# Patient Record
Sex: Male | Born: 1950
Health system: Southern US, Community
[De-identification: ages and names within clinical notes are randomized; demographics above are authoritative.]

## PROBLEM LIST (undated history)

## (undated) DIAGNOSIS — R001 Bradycardia, unspecified: Secondary | ICD-10-CM

## (undated) DIAGNOSIS — J189 Pneumonia, unspecified organism: Secondary | ICD-10-CM

## (undated) DIAGNOSIS — M1990 Unspecified osteoarthritis, unspecified site: Secondary | ICD-10-CM

## (undated) DIAGNOSIS — I219 Acute myocardial infarction, unspecified: Secondary | ICD-10-CM

## (undated) DIAGNOSIS — I471 Supraventricular tachycardia, unspecified: Secondary | ICD-10-CM

## (undated) DIAGNOSIS — E7800 Pure hypercholesterolemia, unspecified: Secondary | ICD-10-CM

## (undated) DIAGNOSIS — K279 Peptic ulcer, site unspecified, unspecified as acute or chronic, without hemorrhage or perforation: Secondary | ICD-10-CM

## (undated) DIAGNOSIS — I209 Angina pectoris, unspecified: Secondary | ICD-10-CM

## (undated) DIAGNOSIS — I2510 Atherosclerotic heart disease of native coronary artery without angina pectoris: Secondary | ICD-10-CM

## (undated) DIAGNOSIS — M545 Low back pain, unspecified: Secondary | ICD-10-CM

## (undated) DIAGNOSIS — I2119 ST elevation (STEMI) myocardial infarction involving other coronary artery of inferior wall: Secondary | ICD-10-CM

## (undated) DIAGNOSIS — I10 Essential (primary) hypertension: Secondary | ICD-10-CM

## (undated) DIAGNOSIS — G459 Transient cerebral ischemic attack, unspecified: Secondary | ICD-10-CM

## (undated) DIAGNOSIS — D126 Benign neoplasm of colon, unspecified: Secondary | ICD-10-CM

## (undated) DIAGNOSIS — I639 Cerebral infarction, unspecified: Secondary | ICD-10-CM

## (undated) DIAGNOSIS — K219 Gastro-esophageal reflux disease without esophagitis: Secondary | ICD-10-CM

## (undated) DIAGNOSIS — G8929 Other chronic pain: Secondary | ICD-10-CM

## (undated) HISTORY — DX: Benign neoplasm of colon, unspecified: D12.6

## (undated) HISTORY — DX: Cerebral infarction, unspecified: I63.9

## (undated) HISTORY — PX: CARDIAC CATHETERIZATION: SHX172

---

## 1989-01-13 HISTORY — PX: COLECTOMY: SHX59

## 1997-09-03 ENCOUNTER — Emergency Department (HOSPITAL_COMMUNITY): Admission: EM | Admit: 1997-09-03 | Discharge: 1997-09-03 | Payer: Self-pay | Admitting: Emergency Medicine

## 1997-09-17 ENCOUNTER — Emergency Department (HOSPITAL_COMMUNITY): Admission: EM | Admit: 1997-09-17 | Discharge: 1997-09-17 | Payer: Self-pay | Admitting: Emergency Medicine

## 1998-12-13 ENCOUNTER — Emergency Department (HOSPITAL_COMMUNITY): Admission: EM | Admit: 1998-12-13 | Discharge: 1998-12-13 | Payer: Self-pay | Admitting: Emergency Medicine

## 1999-11-23 ENCOUNTER — Inpatient Hospital Stay (HOSPITAL_COMMUNITY): Admission: EM | Admit: 1999-11-23 | Discharge: 1999-11-24 | Payer: Self-pay | Admitting: Emergency Medicine

## 1999-11-23 ENCOUNTER — Encounter: Payer: Self-pay | Admitting: Emergency Medicine

## 2003-02-11 ENCOUNTER — Emergency Department (HOSPITAL_COMMUNITY): Admission: EM | Admit: 2003-02-11 | Discharge: 2003-02-11 | Payer: Self-pay | Admitting: Emergency Medicine

## 2003-02-11 ENCOUNTER — Encounter: Payer: Self-pay | Admitting: Emergency Medicine

## 2003-03-13 ENCOUNTER — Ambulatory Visit (HOSPITAL_COMMUNITY): Admission: RE | Admit: 2003-03-13 | Discharge: 2003-03-13 | Payer: Self-pay | Admitting: Cardiology

## 2004-02-05 ENCOUNTER — Emergency Department (HOSPITAL_COMMUNITY): Admission: EM | Admit: 2004-02-05 | Discharge: 2004-02-05 | Payer: Self-pay | Admitting: Emergency Medicine

## 2004-02-12 ENCOUNTER — Ambulatory Visit: Payer: Self-pay | Admitting: Internal Medicine

## 2005-07-04 ENCOUNTER — Ambulatory Visit: Payer: Self-pay | Admitting: Family Medicine

## 2005-07-04 ENCOUNTER — Inpatient Hospital Stay (HOSPITAL_COMMUNITY): Admission: EM | Admit: 2005-07-04 | Discharge: 2005-07-05 | Payer: Self-pay | Admitting: *Deleted

## 2005-07-19 ENCOUNTER — Ambulatory Visit: Payer: Self-pay | Admitting: Family Medicine

## 2006-06-05 ENCOUNTER — Emergency Department (HOSPITAL_COMMUNITY): Admission: EM | Admit: 2006-06-05 | Discharge: 2006-06-05 | Payer: Self-pay | Admitting: Emergency Medicine

## 2006-07-09 ENCOUNTER — Emergency Department (HOSPITAL_COMMUNITY): Admission: EM | Admit: 2006-07-09 | Discharge: 2006-07-09 | Payer: Self-pay | Admitting: Emergency Medicine

## 2006-07-12 DIAGNOSIS — I2510 Atherosclerotic heart disease of native coronary artery without angina pectoris: Secondary | ICD-10-CM | POA: Insufficient documentation

## 2006-07-12 DIAGNOSIS — K270 Acute peptic ulcer, site unspecified, with hemorrhage: Secondary | ICD-10-CM | POA: Insufficient documentation

## 2006-08-14 DIAGNOSIS — I2119 ST elevation (STEMI) myocardial infarction involving other coronary artery of inferior wall: Secondary | ICD-10-CM

## 2006-08-14 HISTORY — PX: CORONARY ANGIOPLASTY WITH STENT PLACEMENT: SHX49

## 2006-08-14 HISTORY — DX: ST elevation (STEMI) myocardial infarction involving other coronary artery of inferior wall: I21.19

## 2006-08-15 ENCOUNTER — Emergency Department (HOSPITAL_COMMUNITY): Admission: EM | Admit: 2006-08-15 | Discharge: 2006-08-15 | Payer: Self-pay | Admitting: Emergency Medicine

## 2006-09-03 ENCOUNTER — Inpatient Hospital Stay (HOSPITAL_COMMUNITY): Admission: EM | Admit: 2006-09-03 | Discharge: 2006-09-07 | Payer: Self-pay | Admitting: Emergency Medicine

## 2006-10-14 ENCOUNTER — Emergency Department (HOSPITAL_COMMUNITY): Admission: EM | Admit: 2006-10-14 | Discharge: 2006-10-14 | Payer: Self-pay | Admitting: Emergency Medicine

## 2007-03-20 ENCOUNTER — Emergency Department (HOSPITAL_COMMUNITY): Admission: EM | Admit: 2007-03-20 | Discharge: 2007-03-20 | Payer: Self-pay | Admitting: Emergency Medicine

## 2007-04-26 ENCOUNTER — Encounter (INDEPENDENT_AMBULATORY_CARE_PROVIDER_SITE_OTHER): Payer: Self-pay | Admitting: Family Medicine

## 2007-06-21 ENCOUNTER — Emergency Department (HOSPITAL_COMMUNITY): Admission: EM | Admit: 2007-06-21 | Discharge: 2007-06-21 | Payer: Self-pay | Admitting: Emergency Medicine

## 2008-07-20 ENCOUNTER — Emergency Department (HOSPITAL_COMMUNITY): Admission: EM | Admit: 2008-07-20 | Discharge: 2008-07-20 | Payer: Self-pay | Admitting: Emergency Medicine

## 2008-07-21 ENCOUNTER — Ambulatory Visit: Payer: Self-pay | Admitting: Family Medicine

## 2008-07-21 ENCOUNTER — Telehealth: Payer: Self-pay | Admitting: Family Medicine

## 2008-07-21 DIAGNOSIS — I10 Essential (primary) hypertension: Secondary | ICD-10-CM

## 2008-07-24 ENCOUNTER — Encounter: Payer: Self-pay | Admitting: Family Medicine

## 2008-07-24 ENCOUNTER — Encounter: Admission: RE | Admit: 2008-07-24 | Discharge: 2008-08-19 | Payer: Self-pay | Admitting: Family Medicine

## 2008-11-13 ENCOUNTER — Encounter (HOSPITAL_COMMUNITY): Admission: RE | Admit: 2008-11-13 | Discharge: 2009-01-14 | Payer: Self-pay | Admitting: Cardiology

## 2008-12-21 ENCOUNTER — Observation Stay (HOSPITAL_COMMUNITY): Admission: EM | Admit: 2008-12-21 | Discharge: 2008-12-23 | Payer: Self-pay | Admitting: Emergency Medicine

## 2008-12-21 ENCOUNTER — Ambulatory Visit: Payer: Self-pay | Admitting: Internal Medicine

## 2009-01-12 DIAGNOSIS — F1910 Other psychoactive substance abuse, uncomplicated: Secondary | ICD-10-CM

## 2009-01-12 DIAGNOSIS — M1990 Unspecified osteoarthritis, unspecified site: Secondary | ICD-10-CM | POA: Insufficient documentation

## 2009-01-12 DIAGNOSIS — I10 Essential (primary) hypertension: Secondary | ICD-10-CM | POA: Insufficient documentation

## 2009-01-12 DIAGNOSIS — F17200 Nicotine dependence, unspecified, uncomplicated: Secondary | ICD-10-CM | POA: Insufficient documentation

## 2009-01-12 DIAGNOSIS — Z87891 Personal history of nicotine dependence: Secondary | ICD-10-CM

## 2009-01-12 DIAGNOSIS — I498 Other specified cardiac arrhythmias: Secondary | ICD-10-CM | POA: Insufficient documentation

## 2009-01-12 DIAGNOSIS — K219 Gastro-esophageal reflux disease without esophagitis: Secondary | ICD-10-CM

## 2009-01-12 DIAGNOSIS — E785 Hyperlipidemia, unspecified: Secondary | ICD-10-CM

## 2009-01-12 HISTORY — DX: Nicotine dependence, unspecified, uncomplicated: F17.200

## 2009-01-13 ENCOUNTER — Ambulatory Visit: Payer: Self-pay | Admitting: Internal Medicine

## 2009-02-17 ENCOUNTER — Ambulatory Visit: Payer: Self-pay | Admitting: Family Medicine

## 2009-02-19 ENCOUNTER — Ambulatory Visit: Payer: Self-pay | Admitting: Family Medicine

## 2009-02-19 ENCOUNTER — Encounter: Payer: Self-pay | Admitting: Family Medicine

## 2009-02-19 LAB — CONVERTED CEMR LAB
ALT: 13 units/L (ref 0–53)
AST: 20 units/L (ref 0–37)
Albumin: 4.2 g/dL (ref 3.5–5.2)
Alkaline Phosphatase: 44 units/L (ref 39–117)
BUN: 14 mg/dL (ref 6–23)
Bilirubin, Direct: 0.2 mg/dL (ref 0.0–0.3)
CO2: 25 meq/L (ref 19–32)
Calcium: 9.6 mg/dL (ref 8.4–10.5)
Chloride: 107 meq/L (ref 96–112)
Cholesterol: 145 mg/dL (ref 0–200)
Creatinine, Ser: 1.01 mg/dL (ref 0.40–1.50)
Glucose, Bld: 73 mg/dL (ref 70–99)
HDL: 72 mg/dL (ref >39)
Indirect Bilirubin: 0.5 mg/dL (ref 0.0–0.9)
LDL Cholesterol: 65 mg/dL (ref 0–99)
Potassium: 4.8 meq/L (ref 3.5–5.3)
Sodium: 143 meq/L (ref 135–145)
Total Bilirubin: 0.7 mg/dL (ref 0.3–1.2)
Total CHOL/HDL Ratio: 2
Total Protein: 6.7 g/dL (ref 6.0–8.3)
Triglycerides: 42 mg/dL (ref <150)
VLDL: 8 mg/dL (ref 0–40)

## 2009-02-20 ENCOUNTER — Encounter (INDEPENDENT_AMBULATORY_CARE_PROVIDER_SITE_OTHER): Payer: Self-pay | Admitting: *Deleted

## 2009-02-20 DIAGNOSIS — F17200 Nicotine dependence, unspecified, uncomplicated: Secondary | ICD-10-CM | POA: Insufficient documentation

## 2009-03-03 ENCOUNTER — Ambulatory Visit: Payer: Self-pay | Admitting: Family Medicine

## 2009-03-03 ENCOUNTER — Encounter: Payer: Self-pay | Admitting: Family Medicine

## 2009-03-11 ENCOUNTER — Ambulatory Visit: Payer: Self-pay | Admitting: Family Medicine

## 2009-05-12 ENCOUNTER — Ambulatory Visit: Payer: Self-pay | Admitting: Family Medicine

## 2009-05-15 DIAGNOSIS — I219 Acute myocardial infarction, unspecified: Secondary | ICD-10-CM

## 2009-05-15 HISTORY — DX: Acute myocardial infarction, unspecified: I21.9

## 2009-05-20 ENCOUNTER — Telehealth: Payer: Self-pay | Admitting: Family Medicine

## 2009-06-07 ENCOUNTER — Ambulatory Visit: Payer: Self-pay | Admitting: Family Medicine

## 2009-06-10 ENCOUNTER — Encounter (INDEPENDENT_AMBULATORY_CARE_PROVIDER_SITE_OTHER): Payer: Self-pay | Admitting: *Deleted

## 2009-06-18 ENCOUNTER — Ambulatory Visit: Payer: Self-pay | Admitting: Gastroenterology

## 2009-07-05 ENCOUNTER — Telehealth: Payer: Self-pay | Admitting: Family Medicine

## 2009-07-05 ENCOUNTER — Ambulatory Visit: Payer: Self-pay | Admitting: Family Medicine

## 2009-07-07 ENCOUNTER — Ambulatory Visit: Payer: Self-pay | Admitting: Gastroenterology

## 2009-07-08 ENCOUNTER — Encounter: Payer: Self-pay | Admitting: Family Medicine

## 2009-07-08 ENCOUNTER — Encounter: Payer: Self-pay | Admitting: Gastroenterology

## 2009-11-05 ENCOUNTER — Ambulatory Visit: Payer: Self-pay | Admitting: Family Medicine

## 2009-12-01 ENCOUNTER — Ambulatory Visit: Payer: Self-pay | Admitting: Family Medicine

## 2009-12-14 ENCOUNTER — Emergency Department (HOSPITAL_COMMUNITY): Admission: EM | Admit: 2009-12-14 | Discharge: 2009-12-14 | Payer: Self-pay | Admitting: Emergency Medicine

## 2010-03-18 ENCOUNTER — Encounter: Payer: Self-pay | Admitting: Family Medicine

## 2010-06-05 ENCOUNTER — Encounter: Payer: Self-pay | Admitting: Cardiology

## 2010-06-09 ENCOUNTER — Ambulatory Visit: Admission: RE | Admit: 2010-06-09 | Discharge: 2010-06-09 | Payer: Self-pay | Source: Home / Self Care

## 2010-06-09 DIAGNOSIS — M25559 Pain in unspecified hip: Secondary | ICD-10-CM | POA: Insufficient documentation

## 2010-06-14 NOTE — Letter (Signed)
Summary: Moviprep Instructions  [facility]  [address]   [place], [place] [address]   Phone: [phone]  Fax: [phone]       [name]    Jul 25, 1950    MRN: [id number]        Procedure Day Dorna Bloom: Wednesday 07-07-09     Arrival Time: 7:30 a.m.     Procedure Time: 8:30 a.m.     Location of Procedure:                     x  [facility] (4th Floor)                        PREPARATION FOR COLONOSCOPY WITH MOVIPREP   Starting 5 days prior to your procedure 07-02-09 do not eat nuts, seeds, popcorn, corn, beans, peas,  salads, or any raw vegetables.  Do not take any fiber supplements (e.g. Metamucil, Citrucel, and Benefiber).  THE DAY BEFORE YOUR PROCEDURE         DATE:  07-06-09  DAY: Tuesday  1.  Drink clear liquids the entire day-NO SOLID FOOD  2.  Do not drink anything colored red or purple.  Avoid juices with pulp.  No orange juice.  3.  Drink at least 64 oz. (8 glasses) of fluid/clear liquids during the day to prevent dehydration and help the prep work efficiently.  CLEAR LIQUIDS INCLUDE: Water Jello Ice Popsicles Tea (sugar ok, no milk/cream) Powdered fruit flavored drinks Coffee (sugar ok, no milk/cream) Gatorade Juice: apple, white grape, white cranberry  Lemonade Clear bullion, consomm, broth Carbonated beverages (any kind) Strained chicken noodle soup Hard Candy                             4.  In the morning, mix first dose of MoviPrep solution:    Empty 1 Pouch A and 1 Pouch B into the disposable container    Add lukewarm drinking water to the top line of the container. Mix to dissolve    Refrigerate (mixed solution should be used within 24 hrs)  5.  Begin drinking the prep at 5:00 p.m. The MoviPrep container is divided by 4 marks.   Every 15 minutes drink the solution down to the next mark (approximately 8 oz) until the full liter is complete.   6.  Follow completed prep with 16 oz of clear liquid of your choice  (Nothing red or purple).  Continue to drink clear liquids until bedtime.  7.  Before going to bed, mix second dose of MoviPrep solution:    Empty 1 Pouch A and 1 Pouch B into the disposable container    Add lukewarm drinking water to the top line of the container. Mix to dissolve    Refrigerate  THE DAY OF YOUR PROCEDURE      DATE: 07-07-09  DAY: Wednesday  Beginning at  3:30 a.m. (5 hours before procedure):         1. Every 15 minutes, drink the solution down to the next mark (approx 8 oz) until the full liter is complete.  2. Follow completed prep with 16 oz. of clear liquid of your choice.    3. You may drink clear liquids until 6:30 a.m.   (2 HOURS BEFORE PROCEDURE).   MEDICATION INSTRUCTIONS  Unless otherwise instructed, you should take regular prescription medications with a small sip of water   as early  as possible the morning of your procedure.         OTHER INSTRUCTIONS  You will need a responsible adult at least 58 years of age to accompany you and drive you home.   This person must remain in the waiting room during your procedure.  Wear loose fitting clothing that is easily removed.  Leave jewelry and other valuables at home.  However, you may wish to bring a book to read or  an iPod/MP3 player to listen to music as you wait for your procedure to start.  Remove all body piercing jewelry and leave at home.  Total time from sign-in until discharge is approximately 2-3 hours.  You should go home directly after your procedure and rest.  You can resume normal activities the  day after your procedure.  The day of your procedure you should not:   Drive   Make legal decisions   Operate machinery   Drink alcohol   Return to work  You will receive specific instructions about eating, activities and medications before you leave.    The above instructions have been reviewed and explained to me by   Clide Cliff, RN______________________    I fully  understand and can verbalize these instructions _____________________________ Date _________

## 2010-06-14 NOTE — Miscellaneous (Signed)
Summary: Direct colon- screening/resch from 01/28  Clinical Lists Changes  Medications: Added new medication of MOVIPREP 100 GM  SOLR (PEG-KCL-NACL-NASULF-NA ASC-C) As directed - Signed Rx of MOVIPREP 100 GM  SOLR (PEG-KCL-NACL-NASULF-NA ASC-C) As directed;  #1 x 0;  Signed;  Entered by: Clide Cliff RN;  Authorized by: Louis Meckel MD;  Method used: Electronically to [facility] [address], [place], [place], [place]  [address], Ph: [phone], Fax: [phone] Observations: Added new observation of ALLERGY REV: Done (06/18/2009 14:36)    Prescriptions: MOVIPREP 100 GM  SOLR (PEG-KCL-NACL-NASULF-NA ASC-C) As directed  #1 x 0   Entered by:   Clide Cliff RN   Authorized by:   Louis Meckel MD   Signed by:   Clide Cliff RN on 06/18/2009   Method used:   Electronically to        [facility] (retail)       [address]       [place], [place]  [address]       Ph: [phone]       Fax: [phone]   RxID:   [id number]

## 2010-06-14 NOTE — Progress Notes (Signed)
Summary: triage   Phone Note Call from Patient Call back at Home Phone [phone]   Caller: Patient Summary of Call: still having nosebleeds and cough Initial call taken by: De Nurse,  May 20, 2009 9:26 AM  Follow-up for Phone Call        wife states he still has a bad cough & wants him seen. appt at 4 with Dr. Sharen Hones. pcp not available Follow-up by: Golden Circle RN,  May 20, 2009 9:55 AM

## 2010-06-14 NOTE — Assessment & Plan Note (Signed)
Summary: F/U VISIT/[facility]   Vital Signs:  Patient profile:   60 year old male Height:      73 inches Weight:      155 pounds BMI:     20.52 BSA:     1.93 Temp:     99.4 degrees F Pulse rate:   80 / minute BP sitting:   189 / 101  Vitals Entered By: Jone Baseman CMA (June 07, 2009 10:54 AM) CC: Cough, HTN Is Patient Diabetic? No Pain Assessment Patient in pain? no        Primary Care Provider:  Family Practice  CC:  Cough and HTN.  History of Present Illness: 1. Cough: Pt has had a chronic, dry cough for the past 2 months.  He thinks that it may be related to the medicine that he was taking.  He was on Ramipril but that was changed to Losartan 2 weeks ago.  He thinks that the cough may be a little bit better after making that change.          ROS: he denies any fevers, shortness of breath, lower extremity swelling  2. HTN:  Dr. Sharyn Lull increased Losartan to 100mg  daily.  He has been out of his other blood pressure medications for the past couple of weeks.  He has not been checking his blood pressure regularly.       ROS: he denies any chest pain or headache.  3. Tobacco dependence: he continues to smoke.  He has cut down to 3 cigarettes / day.  He wants to try and quit.  Did not go to tob cessation class.  Habits & Providers  Alcohol-Tobacco-Diet     Tobacco Status: current     Tobacco Counseling: to quit use of tobacco products     Cigarette Packs/Day: 0.5  Current Medications (verified): 1)  Omeprazole 20 Mg Cpdr (Omeprazole) .... Once Daily 2)  Aspirin 81 Mg Tbec (Aspirin) .... Take One Tablet By Mouth Daily 3)  Lipitor 40 Mg Tabs (Atorvastatin Calcium) .... Once Daily 4)  Toprol Xl 25 Mg Xr24h-Tab (Metoprolol Succinate) .... Take 1 Tab By Mouth Daily 5)  Losartan Potassium 100 Mg Tabs (Losartan Potassium) .... Take 1 Tablet Daily - This Replaces Ramipril  Allergies: No Known Drug Allergies  Past History:  Past Medical History: Reviewed history from  01/13/2009 and no changes required. 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 married, hx of polysubstance abuse.    He lives with his lady friend.  He has positive alcohol, 1  to 2 beers a day.  Positive tobacco usePacks/Day:  0.5  Physical Exam  General:  Vitals reviewed.  alert, well-developed, well-nourished, and well-hydrated.   Head:  NCAT Eyes:  Fundoscopic exam benign Nose:  no nasal discharge.   Mouth:  MMM Neck:  supple and no masses.   Lungs:  normal respiratory effort and normal breath sounds.  no crackles and no wheezes.   Heart:  normal rate, regular rhythmt. No murmur, gallops, or rubs. Abdomen:  soft, non-tender, and normal bowel sounds.   Extremities:  no lower extremity edema  Psych:  not anxious appearing and not depressed appearing.     Impression & Recommendations:  Problem # 1:  COUGH (ICD-786.2)  Assessment Unchanged Chronic cough.  Will get CXR.  Will continue to monitor after medication change for another 2 weeks.  If coughing persists it may be related to the ARB. Orders: CXR- 2view (CXR) [facility]- Est  Level 4 ([address]1478)  Problem # 2:  HYPERTENSION (ICD-401.9) Assessment: Deteriorated  He was not taking his Toprol.  Will restart that and recheck in a couple weeks. The following medications were removed from the medication list:    Losartan Potassium 50 Mg Tabs (Losartan potassium) .Marland Kitchen... 1 by mouth once daily for blood pressure.  replaces ramipril His updated medication list for this problem includes:    Toprol Xl 25 Mg Xr24h-tab (Metoprolol succinate) .Marland Kitchen...  Take 1 tab by mouth daily    Losartan Potassium 100 Mg Tabs (Losartan potassium) .Marland Kitchen... Take 1 tablet daily - this replaces ramipril  Orders: [facility]- Est  Level 4 ([address]562)  Problem # 3:  TOBACCO USER (ICD-305.1) Assessment: Unchanged  Counseled on tobacco cessation  Orders: [facility]- Est  Level 4 ([address])  Complete Medication List: 1)  Omeprazole 20 Mg Cpdr (Omeprazole) .... Once daily 2)  Aspirin 81 Mg Tbec (Aspirin) .... Take one tablet by mouth daily 3)  Lipitor 40 Mg Tabs (Atorvastatin calcium) .... Once daily 4)  Toprol Xl 25 Mg Xr24h-tab (Metoprolol succinate) .... Take 1 tab by mouth daily 5)  Losartan Potassium 100 Mg Tabs (Losartan potassium) .... Take 1 tablet daily - this replaces ramipril  Other Orders: Colonoscopy (Colon)  Patient Instructions: 1)  There are a couple things that we will work on 2)  I am going to send you for a chest x-ray to evaluate this chronic cough 3)  It may be caused by the medicine but lets give it a little more time to work 4)  I have also put in the referral for the colonoscopy so please get that done at your convenience. 5)  Please schedule a follow up appt in 3 weeks Prescriptions: TOPROL XL 25 MG XR24H-TAB (METOPROLOL SUCCINATE) Take 1 tab by mouth daily  #30 x 3   Entered and Authorized by:   Angelena Sole MD   Signed by:   Angelena Sole MD on 06/07/2009   Method used:   Electronically to        [facility] (retail)       [address]       [place], [place]  [address]       Ph: [phone]       Fax: [phone]   RxID:   [id number] LIPITOR 40 MG TABS (ATORVASTATIN CALCIUM) once daily  #30 x 6   Entered and Authorized by:   Angelena Sole MD   Signed by:   Angelena Sole MD on 06/07/2009   Method used:   Electronically to        [facility] (retail)       [address]       [place], [place]  [address]       Ph: [phone]       Fax: [phone]   RxID:    [id number] OMEPRAZOLE 20 MG CPDR (OMEPRAZOLE) once daily  #30 x 6   Entered and Authorized by:   Angelena Sole MD   Signed by:   Angelena Sole MD on 06/07/2009   Method used:   Electronically to        [facility] (retail)       [address]       [place], [place]  [address]       Ph: [phone]       Fax: [phone]   RxID:   [id number]   Prevention & Chronic Care Immunizations   Influenza vaccine: Not documented    Tetanus booster: Not documented    Pneumococcal vaccine: Not documented  Colorectal Screening   Hemoccult: Done.  (06/15/2005)    Colonoscopy: Not documented   Colonoscopy action/deferral: GI Referral  (06/07/2009)  Other Screening   PSA: Not documented   Smoking status: current  (06/07/2009)  Lipids   Total Cholesterol: 145  (02/19/2009)   Lipid panel action/deferral: Lipid Panel ordered   LDL: 65  (02/19/2009)   LDL Direct: Not documented   HDL: 72  (02/19/2009)   Triglycerides: 42  (02/19/2009)    SGOT (AST): 20  (02/19/2009)   BMP action: Ordered   SGPT (ALT): 13  (02/19/2009)   Alkaline phosphatase: 44  (02/19/2009)   Total bilirubin: 0.7  (02/19/2009)    Lipid flowsheet reviewed?: Yes   Progress toward LDL goal: At goal  Hypertension   Last Blood Pressure: 189 / 101  (06/07/2009)   Serum creatinine: 1.01  (02/19/2009)   BMP action: Ordered   Serum potassium 4.8  (02/19/2009)    Hypertension flowsheet reviewed?: Yes   Progress toward BP goal: Unchanged  Self-Management Support :   Personal Goals (by the next clinic visit) :      Personal blood pressure goal: 140/90  (02/17/2009)     Personal LDL goal: 100  (02/17/2009)    Hypertension self-management support: Not documented    Lipid self-management support: Not documented    Nursing Instructions: Screening colonoscopy ordered

## 2010-06-14 NOTE — Letter (Signed)
Summary: Patient Notice- Polyp Results  [facility]  [address] [place], [place] [address]   Phone: [phone]  Fax: [phone]        July 08, 2009 MRN: [id number]    [name] [address] [place], [place]  [address]    Dear [name],  I am pleased to inform you that the colon polyp(s) removed during your recent colonoscopy was (were) found to be benign (no cancer detected) upon pathologic examination.  I recommend you have a repeat colonoscopy examination in 5_ years to look for recurrent polyps, as having colon polyps increases your risk for having recurrent polyps or even colon cancer in the future.  Should you develop new or worsening symptoms of abdominal pain, bowel habit changes or bleeding from the rectum or bowels, please schedule an evaluation with either your primary care physician or with me.  Additional information/recommendations:  __ No further action with gastroenterology is needed at this time. Please      follow-up with your primary care physician for your other healthcare      needs.  __ Please call [phone] to schedule a return visit to review your      situation.  __ Please keep your follow-up visit as already scheduled.  __x Continue treatment plan as outlined the day of your exam.  Please call us if you are having persistent problems or have questions about your condition that have not been fully answered at this time.  Sincerely,  Louis Meckel MD  This letter has been electronically signed by your physician.  Appended Document: Patient Notice- Polyp Results Letter mailed 2.25.11  Appended Document: Patient Notice- Polyp Results     Colonoscopy  Procedure date:  07/09/2009  Findings:      Benign Polyp  Comments:      Repeat colonoscopy in 5 years.   Allergies: No Known Drug Allergies   Complete Medication List: 1)  Omeprazole 20 Mg Cpdr (Omeprazole) .... Once daily 2)  Aspirin 81 Mg  Tbec (Aspirin) .... Take one tablet by mouth daily 3)  Lipitor 40 Mg Tabs (Atorvastatin calcium) .... Once daily 4)  Toprol Xl 25 Mg Xr24h-tab (Metoprolol succinate) .... Take 1 tab by mouth daily 5)  Amlodipine Besylate 5 Mg Tabs (Amlodipine besylate) .... Take 1 tab by mouth daily for blood pressure 6)  Tramadol Hcl 50 Mg Tabs (Tramadol hcl) .... Take 1 tab by mouth every 8 hours as needed for pain   Prevention & Chronic Care Immunizations   Influenza vaccine: Not documented    Tetanus booster: Not documented    Pneumococcal vaccine: Not documented  Colorectal Screening   Hemoccult: Done.  (06/15/2005)    Colonoscopy: Benign Polyp  (07/09/2009)   Colonoscopy action/deferral: Repeat colonoscopy in 5 years.   (07/09/2009)   Colonoscopy due: 06/2014  Other Screening   PSA: Not documented   Smoking status: current  (07/05/2009)  Lipids   Total Cholesterol: 145  (02/19/2009)   Lipid panel action/deferral: Lipid Panel ordered   LDL: 65  (02/19/2009)   LDL Direct: Not documented   HDL: 72  (02/19/2009)   Triglycerides: 42  (02/19/2009)    SGOT (AST): 20  (02/19/2009)   BMP action: Ordered   SGPT (ALT): 13  (02/19/2009)   Alkaline phosphatase: 44  (02/19/2009)   Total bilirubin: 0.7  (02/19/2009)  Hypertension   Last Blood Pressure: 170 / 96  (07/05/2009)   Serum creatinine: 1.01  (02/19/2009)  BMP action: Ordered   Serum potassium 4.8  (02/19/2009)  Self-Management Support :   Personal Goals (by the next clinic visit) :      Personal blood pressure goal: 140/90  (02/17/2009)     Personal LDL goal: 100  (02/17/2009)    Hypertension self-management support: Not documented    Lipid self-management support: Not documented

## 2010-06-14 NOTE — Procedures (Signed)
Summary: Colonoscopy  Patient: [name] Note: All result statuses are Final unless otherwise noted.  Tests: (1) Colonoscopy (COL)   COL Colonoscopy           DONE     [facility]     [address]     [place], [place]  [address]           COLONOSCOPY PROCEDURE REPORT           PATIENT:  [name], [name]  MR#:  [id number]     BIRTHDATE:  Feb 25, 1951, 58 yrs. old  GENDER:  male           ENDOSCOPIST:  Barbette Hair. Arlyce Dice, MD     Referred by:           PROCEDURE DATE:  07/07/2009     PROCEDURE:  Colonoscopy with snare polypectomy     ASA CLASS:  Class II     INDICATIONS:  Elevated Risk Screening, evaluation of ulcerative     colitis, family history of colon cancer Father and brother - colon     CA           MEDICATIONS:   Fentanyl 50 mcg IV, Versed 6 mg IV           DESCRIPTION OF PROCEDURE:   After the risks benefits and     alternatives of the procedure were thoroughly explained, informed     consent was obtained.  Digital rectal exam was performed and     revealed no abnormalities.   The  endoscope was introduced through     the anus and advanced to the cecum, which was identified by both     the appendix and ileocecal valve, without limitations.  The     quality of the prep was excellent, using MoviPrep.  The instrument     was then slowly withdrawn as the colon was fully examined.     <<PROCEDUREIMAGES>>           FINDINGS:  A sessile polyp was found in the ascending colon. It     was 3 mm in size. Polyp was snared without cautery. Retrieval was     successful (see image4). snare polyp  This was otherwise a normal     examination of the colon (see image1, image2, image3, image5,     image8, image9, image10, and image11).   Retroflexed views in the     rectum revealed no abnormalities.    The scope was then withdrawn     from the patient and the procedure completed.           COMPLICATIONS:  None           ENDOSCOPIC IMPRESSION:     1) 3 mm sessile polyp in the  ascending colon     2) Otherwise normal examination     RECOMMENDATIONS:     1) Given your significant family history of colon cancer, you     should have a repeat colonoscopy in 5 years           REPEAT EXAM:  In 5 year(s) for Colonoscopy.           ______________________________     Barbette Hair. Arlyce Dice, MD           cc: Dr. Angelena Sole           n.     eSIGNED:   Barbette Hair. Kaplan at 07/07/2009 09:27 AM  [name], [id number]  Note: An exclamation mark (!) indicates a result that was not dispersed into the flowsheet. Document Creation Date: 07/07/2009 9:27 AM _______________________________________________________________________  (1) Order result status: Final Collection or observation date-time: 07/07/2009 09:19 Requested date-time:  Receipt date-time:  Reported date-time:  Referring Physician:   Ordering Physician: Melvia Heaps [phone]) Specimen Source:  Source: Launa Grill Order Number: [id number] Lab site:   Appended Document: Colonoscopy     Procedures Next Due Date:    Colonoscopy: 06/2014

## 2010-06-14 NOTE — Assessment & Plan Note (Signed)
Summary: allergies/congestion,df   Vital Signs:  Patient profile:   60 year old male Height:      73 inches Weight:      143.1 pounds BMI:     18.95 Temp:     98.2 degrees F oral Pulse rate:   52 / minute BP sitting:   162 / 83  (left arm) Cuff size:   regular  Vitals Entered By: Garen Grams LPN (November 05, 2009 8:55 AM) CC: head/chest congestion Is Patient Diabetic? No Pain Assessment Patient in pain? no        Primary Care Provider:  Family Practice  CC:  head/chest congestion.  History of Present Illness: 1) Cough: Worse x 1 1/2 weeks. Reports symptoms of sinus congestion and purulent nasal discharge starting 3 weeks ago which resolved about 1 week ago. Now reports cough with some white sputum. Denies dyspnea, fever, chest pain, LE edema, sore throat, emesis, diarrhea, myalgia, sick contacts. Smokes as below.  Reports has chronic cough (not daily - "maybe one or two days a week")   2) Tobacco use: 1/2 pack per day. Does not wish to quit at this time. Understands many of the health risks associated with smoking.   Habits & Providers  Alcohol-Tobacco-Diet     Tobacco Status: current     Tobacco Counseling: to quit use of tobacco products     Cigarette Packs/Day: 0.5  Current Medications (verified): 1)  Omeprazole 20 Mg Cpdr (Omeprazole) .... Once Daily 2)  Aspirin 81 Mg Tbec (Aspirin) .... Take One Tablet By Mouth Daily 3)  Lipitor 40 Mg Tabs (Atorvastatin Calcium) .... Once Daily 4)  Toprol Xl 25 Mg Xr24h-Tab (Metoprolol Succinate) .... Take 1 Tab By Mouth Daily 5)  Amlodipine Besylate 5 Mg Tabs (Amlodipine Besylate) .... Take 1 Tab By Mouth Daily For Blood Pressure 6)  Tramadol Hcl 50 Mg Tabs (Tramadol Hcl) .... Take 1 Tab By Mouth Every 8 Hours As Needed For Pain 7)  Prednisone 50 Mg Tabs (Prednisone) .... One Tab By Mouth Qday X 7 Days 8)  Ventolin Hfa 108 (90 Base) Mcg/act Aers (Albuterol Sulfate) .... Two Puffs Inhaled Every 4 Hours For Wheezing, Shortness of  Breath. Disp: One Mdi W/ Spacer and Instructions On Use  Allergies (verified): No Known Drug Allergies  Review of Systems       see above o/w negative   Physical Exam  General:  vitals reviewed. thin, NAD  Head:  no sinus tenderness  Nose:  no congestion or rhinorrhea  Mouth:  no erythema or exudate  Neck:  no lymphadenopathy   Lungs:  normal respiratory effort, clear to auscultation bilaterally, no wheezes; some transmitted upper airway noise  Heart:  normal rate, regular rhythm. No murmur, gallops, or rubs. Extremities:  no edema    Impression & Recommendations:  Problem # 1:  COUGH (ICD-786.2) Assessment Deteriorated  Likely bronchitis (mild). Concern for mild COPD exacerbation (no formal diagnosis - but has had chronic cough, longstanding tobacco use). Will treat with course of prednisone. Will give albuterol MDI as needed for wheeze or dyspnea if develops these symptoms. Does not meet criteria for antibiotics per GOLD criteria at this time. Would consider CHECK PFTs at follow up when well. Follow up with PCP in one month.   Orders: [facility]- Est  Level 4 ([address])  Problem # 2:  TOBACCO USER (ICD-305.1) Assessment: Unchanged  Precontemplative. Address at next appointment.   Orders: [facility]- Est  Level 4 ([address])  Complete Medication List: 1)  Omeprazole 20 Mg Cpdr (Omeprazole) .... Once daily 2)  Aspirin 81 Mg Tbec (Aspirin) .... Take one tablet by mouth daily 3)  Lipitor 40 Mg Tabs (Atorvastatin calcium) .... Once daily 4)  Toprol Xl 25 Mg Xr24h-tab (Metoprolol succinate) .... Take 1 tab by mouth daily 5)  Amlodipine Besylate 5 Mg Tabs (Amlodipine besylate) .... Take 1 tab by mouth daily for blood pressure 6)  Tramadol Hcl 50 Mg Tabs (Tramadol hcl) .... Take 1 tab by mouth every 8 hours as needed for pain 7)  Prednisone 50 Mg Tabs (Prednisone) .... One tab by mouth qday x 7 days 8)  Ventolin Hfa 108 (90 Base) Mcg/act Aers (Albuterol sulfate) .... Two puffs inhaled every 4  hours for wheezing, shortness of breath. disp: one mdi w/ spacer and instructions on use  Patient Instructions: 1)  Consider stopping smoking; this will go a long way in preventing lung disease  2)  Take prednisone as instructed 3)  Use your new inhaler as instructed if you have shortness of breath or wheezing  4)  Follow up with Dr. Lelon Perla in 1 month to discuss having lung testing done  Prescriptions: VENTOLIN HFA 108 (90 BASE) MCG/ACT AERS (ALBUTEROL SULFATE) two puffs inhaled every 4 hours for wheezing, shortness of breath. Disp: one MDI w/ spacer and instructions on use  #1 x 0   Entered and Authorized by:   Bobby Rumpf  MD   Signed by:   Bobby Rumpf  MD on 11/05/2009   Method used:   Electronically to        [facility] (retail)       [address]       [place], [place]  [address]       Ph: [phone]       Fax: [phone]   RxID:   [id number] PREDNISONE 50 MG TABS (PREDNISONE) one tab by mouth qday x 7 days  #7 x 0   Entered and Authorized by:   Bobby Rumpf  MD   Signed by:   Bobby Rumpf  MD on 11/05/2009   Method used:   Electronically to        [facility] (retail)       [address]       [place], [place]  [address]       Ph: [phone]       Fax: [phone]   RxID:   [id number]

## 2010-06-14 NOTE — Miscellaneous (Signed)
   Clinical Lists Changes  Problems: Removed problem of ABSCESS, TOOTH (ICD-522.5) Removed problem of DENTAL PAIN (ICD-525.9) Removed problem of EPISTAXIS (ICD-784.7) Removed problem of COUGH (ICD-786.2) Removed problem of SEBACEOUS CYST (ICD-706.2) Removed problem of CHEST PAIN (ICD-786.50) Removed problem of NERVE COMPRESSION, LEG (ICD-355.8) Removed problem of BACK PAIN, LOW (ICD-724.2)

## 2010-06-14 NOTE — Assessment & Plan Note (Signed)
Summary: cough,tcb   Vital Signs:  Patient profile:   60 year old male Height:      73 inches Weight:      143.5 pounds BMI:     19.00 Temp:     98.4 degrees F oral Pulse rate:   66 / minute BP sitting:   164 / 76  (left arm) Cuff size:   regular  Vitals Entered By: Garen Grams LPN (December 01, 2009 2:46 PM)  Serial Vital Signs/Assessments:  Time      Position  BP       Pulse  Resp  Temp     By                     135/75                         Angelena Sole MD  CC: still has cough/congestion Is Patient Diabetic? No Pain Assessment Patient in pain? no        Primary Care Provider:  Family Practice  CC:  still has cough/congestion.  History of Present Illness: 1. Cough / Congestion - Been there for 3 weeks - Was here last week and told that she had a COPD exacerbation and given steroids which seemed to help a little bit - He is still coughing - It is a non-productive cough  ROS: denies fevers, sinus pain, sore throat, LE swelling  2. HTN - Taking his medicines as prescribed - Doesn't check his BP at home regularly  ROS: denies chest pain  3. Tooth problems - Has many teeth that have been giving him problems.  He has been to 2 different oral surgeons who told him that it would cost him at least $1500 out of pocket - He more recently had a tooth abscess that is really bothering him - It is painful, red, and swollen - Chlorhexidine swish seems to help a little  4. Tobacco use - Continues to smoke - not interested in quiting smoking - Would like to know if he has COPD  Habits & Providers  Alcohol-Tobacco-Diet     Tobacco Status: current     Tobacco Counseling: to quit use of tobacco products     Cigarette Packs/Day: 0.75  Current Medications (verified): 1)  Omeprazole 20 Mg Cpdr (Omeprazole) .... Once Daily 2)  Aspirin 81 Mg Tbec (Aspirin) .... Take One Tablet By Mouth Daily 3)  Lipitor 40 Mg Tabs (Atorvastatin Calcium) .... Once Daily 4)  Toprol Xl 25  Mg Xr24h-Tab (Metoprolol Succinate) .... Take 1 Tab By Mouth Daily 5)  Amlodipine Besylate 5 Mg Tabs (Amlodipine Besylate) .... Take 1 Tab By Mouth Daily For Blood Pressure 6)  Tramadol Hcl 50 Mg Tabs (Tramadol Hcl) .... Take 1 Tab By Mouth Every 8 Hours As Needed For Pain 7)  Prednisone 50 Mg Tabs (Prednisone) .... One Tab By Mouth Qday X 7 Days 8)  Ventolin Hfa 108 (90 Base) Mcg/act Aers (Albuterol Sulfate) .... Two Puffs Inhaled Every 4 Hours For Wheezing, Shortness of Breath. Disp: One Mdi W/ Spacer and Instructions On Use 9)  Amoxicillin 875 Mg Tabs (Amoxicillin) .... Take 1 Tab By Mouth Twice A Day For 7 Days 10)  Peridex 0.12 % Soln (Chlorhexidine Gluconate) .... 1/2 Oz Swished in Mouth For 30 Seconds Twice A Day Dispo: 1 Large Bottle 11)  Tylenol 325 Mg Tabs (Acetaminophen) .Marland Kitchen.. 1 Tab By Mouth Two Times A Day As Needed Pain  Allergies: No Known Drug Allergies  Past History:  Past Medical History: Reviewed history from 01/13/2009 and no changes required. 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 Married. Hx of polysubstance abuse.  He has positive alcohol, 1  to 2 beers a day.  Positive tobacco usePacks/Day:  0.75  Physical Exam  General:  vitals reviewed. thin, NAD  Ears:  R ear normal and L ear normal.   Nose:  no congestion or rhinorrhea. No sinus pain or pressure Mouth:  Multiple broken teeth and cavities.  Possible oral roof abscess that is swollen, red, and tender to palpation. Neck:  no lymphadenopathy   Lungs:  normal respiratory effort, clear to auscultation bilaterally,  no wheezes Heart:  normal rate, regular rhythm. No murmur, gallops, or rubs. Abdomen:  soft and non-tender.   Extremities:  no edema    Impression & Recommendations:  Problem # 1:  COUGH (ICD-786.2) Assessment Unchanged Likely bronchitis.  Will treat with Amoxicillin.  Problem # 2:  ABSCESS, TOOTH (ICD-522.5) Assessment: New Treat with Amoxicillin and Tylenol for pain.  Refer to dentist with this acute issue and hopefully he can be seen sooner. Orders: Dental Referral (Dentist)  Problem # 3:  HYPERTENSION (ICD-401.9) Assessment: Unchanged At goal on recheck.  Will not make any med changes.  Would start HCTZ if needed. His updated medication list for this problem includes:    Toprol Xl 25 Mg Xr24h-tab (Metoprolol succinate) .Marland Kitchen... Take 1 tab by mouth daily    Amlodipine Besylate 5 Mg Tabs (Amlodipine besylate) .Marland Kitchen... Take 1 tab by mouth daily for blood pressure  Problem # 4:  TOBACCO USER (ICD-305.1) Assessment: Unchanged Continues to smoke.  Is concerned that he might have COPD.  Would refer to Pharmacy clinic for formal pulmonary function testing.  Complete Medication List: 1)  Omeprazole 20 Mg Cpdr (Omeprazole) .... Once daily 2)  Aspirin 81 Mg Tbec (Aspirin) .... Take one tablet by mouth daily 3)  Lipitor 40 Mg Tabs (Atorvastatin calcium) .... Once daily 4)  Toprol Xl 25 Mg Xr24h-tab (Metoprolol succinate) .... Take 1 tab by mouth daily 5)  Amlodipine Besylate 5 Mg Tabs (Amlodipine besylate) .... Take 1 tab by mouth daily for blood pressure 6)  Tramadol Hcl 50 Mg Tabs (Tramadol hcl) .... Take 1 tab by mouth every 8 hours as needed for pain 7)  Prednisone 50 Mg Tabs (Prednisone) .... One tab by mouth qday x 7 days 8)  Ventolin Hfa 108 (90 Base) Mcg/act Aers (Albuterol sulfate) .... Two puffs inhaled every 4 hours for wheezing, shortness of breath. disp: one mdi w/ spacer and instructions on use 9)  Amoxicillin 875 Mg Tabs (Amoxicillin) .... Take 1 tab by mouth twice a day for 7  days 10)  Peridex 0.12 % Soln (Chlorhexidine gluconate) .... 1/2 oz swished in mouth for 30 seconds twice a day dispo: 1 large bottle 11)  Tylenol 325 Mg Tabs (Acetaminophen) .Marland Kitchen.. 1 tab by mouth two times a day as needed pain  Patient Instructions: 1)  For your cough I will give you an antibiotic 2)  This antibiotic should also help cover your mouth infection 3)  We will also put in a referral for a dentist so hopefully they can take care of  your tooth problems 4)  Your blood pressure is okay for now so we will not make any changes 5)  Please schedule a follow up appointment with me in 2-3 weeks 6)  Let me know if you would like to be referred to our Pharmacy clinic to have pulmonary function testing to see if you have COPD Prescriptions: PERIDEX 0.12 % SOLN (CHLORHEXIDINE GLUCONATE) 1/2 oz swished in mouth for 30 seconds twice a day Dispo: 1 large bottle  #1 x 3   Entered and Authorized by:   Angelena Sole MD   Signed by:   Angelena Sole MD on 12/01/2009   Method used:   Electronically to        [facility] (retail)       [address]       [place], [place]  [address]       Ph: [phone]       Fax: [phone]   RxID:   [id number] AMOXICILLIN 875 MG TABS (AMOXICILLIN) Take 1 tab by mouth twice a day for 7 days  #14 x 0   Entered and Authorized by:   Angelena Sole MD   Signed by:   Angelena Sole MD on 12/01/2009   Method used:   Electronically to        [facility] (retail)       [address]       [place], [place]  [address]       Ph: [phone]       Fax: [phone]   RxID:   [id number]   Prevention & Chronic Care Immunizations   Influenza vaccine: Not documented    Tetanus booster: Not documented    Pneumococcal vaccine: Not documented  Colorectal Screening   Hemoccult: Done.  (06/15/2005)    Colonoscopy: Benign Polyp  (07/09/2009)   Colonoscopy action/deferral: Repeat colonoscopy  in 5 years.   (07/09/2009)   Colonoscopy due: 06/2014  Other Screening   PSA: Not documented   Smoking status: current  (12/01/2009)   Smoking cessation counseling: yes  (12/01/2009)  Lipids   Total Cholesterol: 145  (02/19/2009)   Lipid panel action/deferral: Lipid Panel ordered   LDL: 65  (02/19/2009)   LDL Direct: Not documented   HDL: 72  (02/19/2009)   Triglycerides: 42  (02/19/2009)    SGOT (AST): 20  (02/19/2009)   BMP action: Ordered   SGPT (ALT): 13  (02/19/2009)   Alkaline phosphatase: 44  (02/19/2009)   Total bilirubin: 0.7  (02/19/2009)    Lipid flowsheet reviewed?: Yes   Progress toward LDL goal: At goal  Hypertension   Last Blood Pressure: 164 / 76  (12/01/2009)   Serum creatinine: 1.01  (02/19/2009)   BMP action: Ordered   Serum potassium 4.8  (02/19/2009)    Hypertension flowsheet reviewed?: Yes   Progress toward BP goal: Unchanged  Self-Management Support :   Personal Goals (by the next clinic visit) :      Personal blood pressure goal: 140/90  (02/17/2009)     Personal LDL goal: 100  (02/17/2009)    Hypertension self-management support: Not documented    Lipid self-management support: Not documented     Appended Document: cough,tcb     Clinical Lists Changes  Orders: Added new Test order of [facility]- Est  Level 4 ([address]) - Signed

## 2010-06-14 NOTE — Progress Notes (Signed)
Summary: phn msg   Phone Note Call from Patient Call back at [facility] Phone [phone]   Caller: Patient Summary of Call: pt spoke w/ Dr Sharyn Lull - and he still doesn't want him to take the Plavix since he is not having any chest pain.  Pt feels that Dr Lelon Perla needs to talk to [facility]. Initial call taken by: De Nurse,  July 05, 2009 2:09 PM  Follow-up for Phone Call        Called Dr. Sharyn Lull and discussed pt.  He does not need to be back on Plavix according to new guidelines.  Attempted to call pt back and leave message but had no voicemail.  Please try to inform pt about discussion. Follow-up by: Angelena Sole MD,  July 05, 2009 3:45 PM  Additional Follow-up for Phone Call Additional follow up Details #1::        Attempted to reach pt multiple times by phone.  Will send letter regarding his Plavix and my discussion with Dr. Sharyn Lull. Additional Follow-up by: Angelena Sole MD,  July 08, 2009 10:32 AM

## 2010-06-14 NOTE — Letter (Signed)
Summary: Generic Letter  [facility]  [address]   [place], [place] [address]   Phone: [phone]  Fax: [phone]    07/08/2009  [name] [address] [facility] [address] [place], [place]  [address]  Dear [name],  I have discussed with Dr. Sharyn Lull whether you need to continue taking your Plavix or not.  It seems that you were on it for long enough and do not need to continue taking it.  Please call the office if you have any questions.     Sincerely,   Angelena Sole MD  Appended Document: Generic Letter mailed.

## 2010-06-14 NOTE — Progress Notes (Signed)
   Phone Note Outgoing Call   Call placed by: Angelena Sole MD,  July 05, 2009 1:59 PM Action Taken: Phone Call Completed Summary of Call: Spoke with his wife.  Informed them, that I think that patient needs to be back on Plavix since he has a drug eluting stent.  They reiterated that Dr. Sharyn Lull told them that he didn't need to take it anymore.  Told them to touch base with Dr. Sharyn Lull.  They agreed with the plan. Initial call taken by: Angelena Sole MD,  July 05, 2009 2:00 PM

## 2010-06-14 NOTE — Assessment & Plan Note (Signed)
Summary: FU/KH   Vital Signs:  Patient profile:   60 year old male Height:      73 inches Weight:      147.7 pounds BMI:     19.56 Temp:     98.5 degrees F oral Pulse rate:   56 / minute BP sitting:   170 / 96  (left arm) Cuff size:   regular  Vitals Entered By: Garen Grams LPN (July 05, 2009 11:23 AM) CC: f/u BP, CAD,  tooth ache Is Patient Diabetic? No Pain Assessment Patient in pain? no        Primary Care Provider:  Family Practice  CC:  f/u BP, CAD, and tooth ache.  History of Present Illness: 1. Hypertension:  He has not been taking his Losartan for the past couple month because he thinks that it makes him dizzy.  He has been having trouble with being dizzy at work.  When he doesn't take his Losartan he is not dizzy.        ROS: denies chest pain, headache, vision changes  2. CAD: He is still not taking the Plavix.  Dr. Sharyn Lull (Cardiologist) told him to stop taking it because of his nose bleeds).  He is taking the Aspirin.  Is scheduled for a colonoscopy next week.  3. Tooth ache: has had this for a couple of weeks.  Mainly right incisor that is causing him the most problems.  He has been to a dentist who referred him to an oral surgeon because of his stent.  He is having 4/10 pain that shoots up into his jaw.  He hasn't tried anything for the pain yet.      ROS: denies any fevers, foul smelling discharge.  Habits & Providers  Alcohol-Tobacco-Diet     Tobacco Status: current  Current Medications (verified): 1)  Omeprazole 20 Mg Cpdr (Omeprazole) .... Once Daily 2)  Aspirin 81 Mg Tbec (Aspirin) .... Take One Tablet By Mouth Daily 3)  Lipitor 40 Mg Tabs (Atorvastatin Calcium) .... Once Daily 4)  Toprol Xl 25 Mg Xr24h-Tab (Metoprolol Succinate) .... Take 1 Tab By Mouth Daily 5)  Amlodipine Besylate 5 Mg Tabs (Amlodipine Besylate) .... Take 1 Tab By Mouth Daily For Blood Pressure 6)  Tramadol Hcl 50 Mg Tabs (Tramadol Hcl) .... Take 1 Tab By Mouth Every 8 Hours  As Needed For Pain  Allergies: No Known Drug Allergies  Past History:  Past Medical History: Reviewed history from 01/13/2009 and no changes required. 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 married, hx of polysubstance abuse.    He lives with his lady friend.  He has positive alcohol, 1  to 2 beers a day.  Positive tobacco use  Physical Exam  General:  Vitals reviewed.  alert, well-developed, well-nourished, and well-hydrated.   Head:  NCAT Eyes:  Fundoscopic exam benign Mouth:  Some teeth missing.  Poor dentition.  Right incisor doesn't appear infected but is a little loose and tender to manipulation. Neck:  supple and no masses.   Lungs:  normal respiratory effort and normal breath sounds.  no crackles and no wheezes.   Heart:  normal rate, regular rhythmt. No murmur, gallops, or rubs. Abdomen:  soft, non-tender, and normal bowel sounds.  Extremities:  no lower extremity edema  Neurologic:  alert & oriented X3.   Psych:  not anxious appearing and not depressed appearing.     Impression & Recommendations:  Problem # 1:  HYPERTENSION (ICD-401.9) Assessment Unchanged  Will switch from Losartan to Amlodopine since he thinks Losartan has been making him dizzy.  It may be that his blood pressure got too low and that is what was making him dizzy.  Would consider restarting an ARB at some point.  Cannot toleratin ACEI because of cough. The following medications were removed from the medication list:    Losartan Potassium 100 Mg Tabs (Losartan potassium) .Marland Kitchen... Take 1 tablet daily -  this replaces ramipril His updated medication list for this problem includes:    Toprol Xl 25 Mg Xr24h-tab (Metoprolol succinate) .Marland Kitchen... Take 1 tab by mouth daily    Amlodipine Besylate 5 Mg Tabs (Amlodipine besylate) .Marland Kitchen... Take 1 tab by mouth daily for blood pressure  Orders: [facility]- Est  Level 4 ([address]214)  Problem # 2:  CORONARY, ARTERIOSCLEROSIS (ICD-414.00)  s/p stent.  Looking through prior notes from [facility] cardiology he received a drug eluting stent.  Dr. Sharyn Lull told them that he could stop taking it.  Since it is a drug eluting stent than he should be on it for lifetime. The following medications were removed from the medication list:    Losartan Potassium 100 Mg Tabs (Losartan potassium) .Marland Kitchen... Take 1 tablet daily - this replaces ramipril His updated medication list for this problem includes:    Aspirin 81 Mg Tbec (Aspirin) .Marland Kitchen... Take one tablet by mouth daily    Toprol Xl 25 Mg Xr24h-tab (Metoprolol succinate) .Marland Kitchen... Take 1 tab by mouth daily    Amlodipine Besylate 5 Mg Tabs (Amlodipine besylate) .Marland Kitchen... Take 1 tab by mouth daily for blood pressure  Orders: [facility]- Est  Level 4 (30865)  Problem # 3:  DENTAL PAIN (ICD-525.9) Assessment: New  Already has seen oral surgeon. However the cost will be $1500 up front, since 9 teeth need to be removed, which he cannot afford.  Recommended him seeking a second opinion to see if they will just take out the one bothersome tooth.  Will provide Ultram as needed.  Orders: [facility]- Est  Level 4 (78469)  Complete Medication List: 1)  Omeprazole 20 Mg Cpdr (Omeprazole) .... Once daily 2)  Aspirin 81 Mg Tbec (Aspirin) .... Take one tablet by mouth daily 3)  Lipitor 40 Mg Tabs (Atorvastatin calcium) .... Once daily 4)  Toprol Xl 25 Mg Xr24h-tab (Metoprolol succinate) .... Take 1 tab by mouth daily 5)  Amlodipine Besylate 5 Mg Tabs (Amlodipine besylate) .... Take 1 tab by mouth daily for blood pressure 6)  Tramadol Hcl 50 Mg Tabs (Tramadol hcl) .... Take 1 tab  by mouth every 8 hours as needed for pain  Patient Instructions: 1)  We will switch from Losartan to Norvasc 2)  We may need to restart you back on the Plavix 3)  Keep your appointment for the colonscopy I will let you know of any abnormal results 4)  Here is some Ultram for your tooth pain 5)  Please schedule a follow up appointment in 8 weeks. Prescriptions: TRAMADOL HCL 50 MG TABS (TRAMADOL HCL) Take 1 tab by mouth every 8 hours as needed for pain  #90 x 3   Entered and Authorized by:   Angelena Sole MD   Signed by:   Angelena Sole MD on 07/05/2009   Method used:  Electronically to        [facility] (retail)       [address]       [place], [place]  [address]       Ph: [phone]       Fax: [phone]   RxID:   [id number] AMLODIPINE BESYLATE 5 MG TABS (AMLODIPINE BESYLATE) Take 1 tab by mouth daily for blood pressure  #30 x 6   Entered and Authorized by:   Angelena Sole MD   Signed by:   Angelena Sole MD on 07/05/2009   Method used:   Electronically to        [facility] (retail)       [address]       [place], [place]  [address]       Ph: [phone]       Fax: [phone]   RxID:   [id number]   Prevention & Chronic Care Immunizations   Influenza vaccine: Not documented    Tetanus booster: Not documented    Pneumococcal vaccine: Not documented  Colorectal Screening   Hemoccult: Done.  (06/15/2005)    Colonoscopy: Not documented   Colonoscopy action/deferral: GI Referral  (06/07/2009)  Other Screening   PSA: Not documented   Smoking status: current  (07/05/2009)  Lipids   Total Cholesterol: 145  (02/19/2009)   Lipid panel action/deferral: Lipid Panel ordered   LDL: 65  (02/19/2009)   LDL Direct: Not documented   HDL: 72  (02/19/2009)   Triglycerides: 42  (02/19/2009)    SGOT (AST): 20  (02/19/2009)   BMP action: Ordered   SGPT (ALT): 13  (02/19/2009)   Alkaline  phosphatase: 44  (02/19/2009)   Total bilirubin: 0.7  (02/19/2009)  Hypertension   Last Blood Pressure: 170 / 96  (07/05/2009)   Serum creatinine: 1.01  (02/19/2009)   BMP action: Ordered   Serum potassium 4.8  (02/19/2009)    Hypertension flowsheet reviewed?: Yes   Progress toward BP goal: Unchanged  Self-Management Support :   Personal Goals (by the next clinic visit) :      Personal blood pressure goal: 140/90  (02/17/2009)     Personal LDL goal: 100  (02/17/2009)    Hypertension self-management support: Not documented    Lipid self-management support: Not documented

## 2010-06-16 NOTE — Assessment & Plan Note (Signed)
Summary: f/u,df   Vital Signs:  Patient profile:   60 year old male Height:      73 inches Weight:      146.38 pounds BMI:     19.38 BSA:     1.89 Temp:     98.0 degrees F Pulse rate:   56 / minute BP sitting:   137 / 78  Vitals Entered By: Jone Baseman CMA (June 09, 2010 10:48 AM) CC: f/u Is Patient Diabetic? No Pain Assessment Patient in pain? no        Primary Care Provider:  Family Practice  CC:  f/u.  History of Present Illness: Since last visit, he was laid off back in September.  He was called back to work recently but without any benefits like insurance.  He has been off all of his medications since September because he couldn't afford them.  They are trying to get set up with [facility].  1. HTN:  Pt hasn't been taking his medications as prescribed.  He checked his blood pressure last week at the drug store and it was 170/90.  ROS: denies chest pain, shortness of breath  2. HLD:  Pt hasn't been taking his medications as prescribed.  He was on Lipitor but it is too expensive  3. Right hip pain and leg numbness:  Pt has been having this for the past couple of months.  It only happens after a long day at work.  Described as his an achey pain in his right hip that then causes numbness that involves the outside of his right leg and into the bottom of his foot.  ROS: denies loss of bowel / bladder function, denies saddle anesthesia  Habits & Providers  Alcohol-Tobacco-Diet     Tobacco Status: current     Tobacco Counseling: to quit use of tobacco products     Cigarette Packs/Day: 0.5  Current Medications (verified): 1)  Omeprazole 20 Mg Cpdr (Omeprazole) .... Once Daily 2)  Aspirin 81 Mg Tbec (Aspirin) .... Take One Tablet By Mouth Daily 3)  Lipitor 40 Mg Tabs (Atorvastatin Calcium) .... Once Daily 4)  Toprol Xl 25 Mg Xr24h-Tab (Metoprolol Succinate) .... Take 1 Tab By Mouth Daily 5)  Amlodipine Besylate 5 Mg Tabs (Amlodipine Besylate) .... Take 1 Tab By  Mouth Daily For Blood Pressure 6)  Tramadol Hcl 50 Mg Tabs (Tramadol Hcl) .... Take 1 Tab By Mouth Every 8 Hours As Needed For Pain 7)  Prednisone 50 Mg Tabs (Prednisone) .... One Tab By Mouth Qday X 7 Days 8)  Ventolin Hfa 108 (90 Base) Mcg/act Aers (Albuterol Sulfate) .... Two Puffs Inhaled Every 4 Hours For Wheezing, Shortness of Breath. Disp: One Mdi W/ Spacer and Instructions On Use 9)  Amoxicillin 875 Mg Tabs (Amoxicillin) .... Take 1 Tab By Mouth Twice A Day For 7 Days 10)  Peridex 0.12 % Soln (Chlorhexidine Gluconate) .... 1/2 Oz Swished in Mouth For 30 Seconds Twice A Day Dispo: 1 Large Bottle 11)  Tylenol 325 Mg Tabs (Acetaminophen) .Marland Kitchen.. 1 Tab By Mouth Two Times A Day As Needed Pain  Allergies: No Known Drug Allergies  Past History:  Past Medical History: Reviewed history from 01/13/2009 and no changes required. 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 Married. Hx of polysubstance abuse.  He has positive alcohol, 1  to 2 beers a day.  Positive tobacco usePacks/Day:  0.5  Physical Exam  General:  vitals reviewed. thin, NAD  Eyes:  No corneal or conjunctival inflammation noted. EOMI. Perrla. Funduscopic exam benign, without hemorrhages, exudates or papilledema. Vision grossly normal. Neck:  No deformities, masses, or tenderness noted. Lungs:  normal respiratory effort, clear to auscultation bilaterally, no wheezes Heart:  normal rate, regular rhythm. No murmur, gallops, or rubs. Msk:  Right hip:  TTP over right illiac crest and gluteus muscle.  Decreased ROM with both external  and internal rotation.  Left hip:  Non-tender.  Decreased ROM  Back: negative SLR bilaterally Extremities:  no LE edema Neurologic:  strength normal in all extremities, sensation intact to light touch, and gait normal.     Impression & Recommendations:  Problem # 1:  HYPERTENSION (ICD-401.9) Assessment Deteriorated  BP not bad today but has been elevated in the past couple of weeks.  Will restart Topril and Norvasc.  Recheck in a couple of weeks His updated medication list for this problem includes:    Toprol Xl 25 Mg Xr24h-tab (Metoprolol succinate) .Marland Kitchen... Take 1 tab by mouth daily    Amlodipine Besylate 5 Mg Tabs (Amlodipine besylate) .Marland Kitchen... Take 1 tab by mouth daily for blood pressure  Orders: [facility]- Est  Level 4 ([address])  Problem # 2:  HYPERLIPIDEMIA (ICD-272.4) Assessment: Deteriorated  Not taking his Lipitor because he can't afford it.  He doens't have insurance anymore so will switch to Simvastatin. His updated medication list for this problem includes:    Simvastatin 40 Mg Tabs (Simvastatin) .Marland Kitchen... 1 tab by mouth daily for cholesterol  Orders: [facility]- Est  Level 4 ([address])  Problem # 3:  HIP PAIN, RIGHT (ICD-719.45) Assessment: New  sounds like a right hip neuropathy that is causing his right hip pain and numbness that goes down his leg.  He does have decreased ROM of that hip.  Negative SLR and no red flags for herniated disc.  He doesn't have insurace so surgery probably wouldn't even be an option.  Will treat medically for now.  Trial of Neurontin. The following medications were removed from the medication list:    Tylenol 325 Mg Tabs (Acetaminophen) .Marland Kitchen... 1 tab by mouth two times a day as needed pain His updated medication list for this problem includes:    Aspirin 81 Mg Tbec (Aspirin) .Marland Kitchen... Take one tablet by mouth daily    Tramadol Hcl 50 Mg Tabs (Tramadol hcl) .Marland Kitchen... Take 1 tab by mouth every 8 hours as needed for pain  Orders: [facility]- Est  Level 4 ([address]214)  Complete  Medication List: 1)  Omeprazole 20 Mg Cpdr (Omeprazole) .... Once daily 2)  Aspirin 81 Mg Tbec (Aspirin) .... Take one tablet by mouth daily 3)  Simvastatin 40 Mg Tabs (Simvastatin) .Marland Kitchen.. 1 tab by mouth daily for cholesterol 4)  Toprol Xl 25 Mg Xr24h-tab (Metoprolol succinate) .... Take 1 tab by mouth daily 5)  Amlodipine Besylate 5 Mg Tabs (Amlodipine besylate) .... Take 1 tab by mouth daily for blood pressure 6)  Tramadol Hcl 50 Mg Tabs (Tramadol hcl) .... Take 1 tab by mouth every 8 hours as needed for pain 7)  Gabapentin 300 Mg Caps (Gabapentin) .... I tab by mouth daily  Patient Instructions: 1)  I have sent in a refill for all of your medications 2)  I'm sorry about your situation at work  3)  We will try and manage your hip pain with medicines for now 4)  Please schedule a follow up appointment in 6 weeks to recheck your hip pain and blood pressure Prescriptions: AMLODIPINE BESYLATE 5 MG TABS (AMLODIPINE BESYLATE) Take 1 tab by mouth daily for blood pressure  #30 x 6   Entered and Authorized by:   Angelena Sole MD   Signed by:   Angelena Sole MD on 06/09/2010   Method used:   Electronically to        [facility] (retail)       [address]       [place], [place]  [address]       Ph: [phone]       Fax: [phone]   RxID:   [id number] TOPROL XL 25 MG XR24H-TAB (METOPROLOL SUCCINATE) Take 1 tab by mouth daily  #30 x 3   Entered and Authorized by:   Angelena Sole MD   Signed by:   Angelena Sole MD on 06/09/2010   Method used:   Electronically to        [facility] (retail)       [address]       [place], [place]  [address]       Ph: [phone]       Fax: [phone]   RxID:   [id number] SIMVASTATIN 40 MG TABS (SIMVASTATIN) 1 tab by mouth daily for cholesterol  #30 x 3   Entered and Authorized by:   Angelena Sole MD   Signed by:   Angelena Sole MD on 06/09/2010   Method used:    Electronically to        [facility] (retail)       [address]       [place], [place]  [address]       Ph: [phone]       Fax: [phone]   RxID:   [id number] OMEPRAZOLE 20 MG CPDR (OMEPRAZOLE) once daily  #30 x 6   Entered and Authorized by:   Angelena Sole MD   Signed by:   Angelena Sole MD on 06/09/2010   Method used:   Electronically to        [facility] (retail)       [address]       [place], [place]  [address]       Ph: [phone]       Fax: [phone]   RxID:   [id number] GABAPENTIN 300 MG CAPS (GABAPENTIN) I tab by mouth daily  #30 x 3   Entered and Authorized by:   Angelena Sole MD   Signed by:   Angelena Sole MD on 06/09/2010   Method used:   Electronically to        [facility] (retail)       [address]       [place], [place]  [address]       Ph: [phone]       Fax: [phone]   RxID:   [id number]    Orders Added: 1)  [facility]- Est  Level 4 [[address]]

## 2010-08-20 LAB — CBC
HCT: 36.7 % — ABNORMAL LOW (ref 39.0–52.0)
HCT: 38.4 % — ABNORMAL LOW (ref 39.0–52.0)
Hemoglobin: 11.8 g/dL — ABNORMAL LOW (ref 13.0–17.0)
Hemoglobin: 12.5 g/dL — ABNORMAL LOW (ref 13.0–17.0)
MCHC: 32.1 g/dL (ref 30.0–36.0)
MCHC: 32.5 g/dL (ref 30.0–36.0)
MCV: 75.3 fL — ABNORMAL LOW (ref 78.0–100.0)
MCV: 76 fL — ABNORMAL LOW (ref 78.0–100.0)
Platelets: 265 10*3/uL (ref 150–400)
RBC: 4.82 MIL/uL (ref 4.22–5.81)
RBC: 5.1 MIL/uL (ref 4.22–5.81)
RDW: 15.6 % — ABNORMAL HIGH (ref 11.5–15.5)
WBC: 6.2 10*3/uL (ref 4.0–10.5)
WBC: 6.7 10*3/uL (ref 4.0–10.5)

## 2010-08-20 LAB — RAPID URINE DRUG SCREEN, HOSP PERFORMED
Amphetamines: NOT DETECTED
Barbiturates: NOT DETECTED
Benzodiazepines: NOT DETECTED
Cocaine: NOT DETECTED
Opiates: NOT DETECTED
Tetrahydrocannabinol: POSITIVE — AB

## 2010-08-20 LAB — BASIC METABOLIC PANEL
BUN: 10 mg/dL (ref 6–23)
CO2: 23 mEq/L (ref 19–32)
CO2: 24 mEq/L (ref 19–32)
Calcium: 8.7 mg/dL (ref 8.4–10.5)
Chloride: 108 mEq/L (ref 96–112)
Chloride: 109 mEq/L (ref 96–112)
Creatinine, Ser: 0.96 mg/dL (ref 0.4–1.5)
GFR calc Af Amer: >60 mL/min (ref >60)
GFR calc Af Amer: >60 mL/min (ref >60)
GFR calc non Af Amer: >60 mL/min (ref >60)
Glucose, Bld: 97 mg/dL (ref 70–99)
Potassium: 3.5 mEq/L (ref 3.5–5.1)
Potassium: 3.7 mEq/L (ref 3.5–5.1)
Sodium: 138 mEq/L (ref 135–145)
Sodium: 139 mEq/L (ref 135–145)

## 2010-08-20 LAB — APTT: aPTT: 27 seconds (ref 24–37)

## 2010-08-20 LAB — COMPREHENSIVE METABOLIC PANEL
Alkaline Phosphatase: 43 U/L (ref 39–117)
BUN: 8 mg/dL (ref 6–23)
CO2: 24 mEq/L (ref 19–32)
GFR calc non Af Amer: >60 mL/min (ref >60)
Glucose, Bld: 85 mg/dL (ref 70–99)
Potassium: 3.9 mEq/L (ref 3.5–5.1)
Total Bilirubin: 1 mg/dL (ref 0.3–1.2)
Total Protein: 5.9 g/dL — ABNORMAL LOW (ref 6.0–8.3)

## 2010-08-20 LAB — POCT CARDIAC MARKERS
CKMB, poc: 1.4 ng/mL (ref 1.0–8.0)
Myoglobin, poc: 54.8 ng/mL (ref 12–200)
Troponin i, poc: <0.05 ng/mL (ref 0.00–0.09)

## 2010-08-20 LAB — DIFFERENTIAL
Basophils Absolute: 0.1 10*3/uL (ref 0.0–0.1)
Basophils Relative: 1 % (ref 0–1)
Eosinophils Absolute: 0.5 10*3/uL (ref 0.0–0.7)
Eosinophils Relative: 7 % — ABNORMAL HIGH (ref 0–5)
Lymphocytes Relative: 25 % (ref 12–46)
Lymphs Abs: 1.7 10*3/uL (ref 0.7–4.0)
Monocytes Absolute: 0.8 10*3/uL (ref 0.1–1.0)
Monocytes Relative: 12 % (ref 3–12)
Neutro Abs: 3.8 10*3/uL (ref 1.7–7.7)
Neutrophils Relative %: 56 % (ref 43–77)

## 2010-08-20 LAB — MAGNESIUM: Magnesium: 2 mg/dL (ref 1.5–2.5)

## 2010-08-20 LAB — CARDIAC PANEL(CRET KIN+CKTOT+MB+TROPI)
Total CK: 175 U/L (ref 7–232)
Total CK: 200 U/L (ref 7–232)
Troponin I: 0.02 ng/mL (ref 0.00–0.06)

## 2010-08-20 LAB — PROTIME-INR
INR: 1 (ref 0.00–1.49)
Prothrombin Time: 13.2 seconds (ref 11.6–15.2)

## 2010-08-20 LAB — LIPID PANEL
HDL: 64 mg/dL (ref >39)
Total CHOL/HDL Ratio: 2 RATIO
VLDL: 8 mg/dL (ref 0–40)

## 2010-08-20 LAB — CK TOTAL AND CKMB (NOT AT ARMC): CK, MB: 3.5 ng/mL (ref 0.3–4.0)

## 2010-08-20 LAB — TROPONIN I: Troponin I: 0.02 ng/mL (ref 0.00–0.06)

## 2010-08-25 LAB — DIFFERENTIAL
Basophils Absolute: 0 10*3/uL (ref 0.0–0.1)
Basophils Relative: 1 % (ref 0–1)
Eosinophils Absolute: 0.5 10*3/uL (ref 0.0–0.7)
Eosinophils Relative: 7 % — ABNORMAL HIGH (ref 0–5)
Lymphs Abs: 2.3 10*3/uL (ref 0.7–4.0)
Neutrophils Relative %: 48 % (ref 43–77)

## 2010-08-25 LAB — COMPREHENSIVE METABOLIC PANEL
ALT: 15 U/L (ref 0–53)
AST: 24 U/L (ref 0–37)
CO2: 25 mEq/L (ref 19–32)
Calcium: 8.9 mg/dL (ref 8.4–10.5)
Chloride: 107 mEq/L (ref 96–112)
Creatinine, Ser: 1.02 mg/dL (ref 0.4–1.5)
GFR calc non Af Amer: >60 mL/min (ref >60)
Glucose, Bld: 78 mg/dL (ref 70–99)
Sodium: 138 mEq/L (ref 135–145)
Total Bilirubin: 1.1 mg/dL (ref 0.3–1.2)

## 2010-08-25 LAB — CBC
HCT: 44.9 % (ref 39.0–52.0)
MCHC: 33.2 g/dL (ref 30.0–36.0)
MCV: 74.7 fL — ABNORMAL LOW (ref 78.0–100.0)
Platelets: 287 10*3/uL (ref 150–400)

## 2010-08-25 LAB — CK TOTAL AND CKMB (NOT AT ARMC): Total CK: 229 U/L (ref 7–232)

## 2010-08-25 LAB — TROPONIN I: Troponin I: 0.01 ng/mL (ref 0.00–0.06)

## 2010-09-27 NOTE — Cardiovascular Report (Signed)
NAMEHAVIER, DEEB                  ACCOUNT NO.:  [id number]   MEDICAL RECORD NO.:  [id number]          PATIENT TYPE:  INP   LOCATION:  3703                         FACILITY:  [facility]   PHYSICIAN:  Mohan N. Sharyn Lull, M.D. DATE OF BIRTH:  11-Jul-1950   DATE OF PROCEDURE:  12/22/2008  DATE OF DISCHARGE:                            CARDIAC CATHETERIZATION   PROCEDURE:  Left cardiac catheterization with selective left and right  coronary angiography, left ventriculography via right groin using  Judkins technique.   INDICATIONS FOR PROCEDURE:  Mr. Sandria Manly is 60 year old black male with past  medical history significant for coronary artery disease, history of  inferior wall MI, status post PCI to RCA in April 2008, hypertension,  history of SVT, hypercholesteremia, tobacco abuse, history of peptic  ulcer disease, history of cocaine and marijuana abuse in the past,  degenerative joint disease, positive family history of coronary artery  disease.  He came to the ER via EMS complaining of vague left-sided  chest pain associated with numbness in both hands, associated with  palpitation, lightheadedness and feeling weak and diaphoretic.  The  patient took one sublingual nitro, aspirin with relief of chest pain and  was noted to be in SVT.  The patient received carotid sinus massage on  the field by EMS without effect and subsequently received 12 mg of IV  adenosine with conversion to sinus rhythm.  The patient denies any  recent cocaine abuse.  Denies chest pain at present.  States ran out of  medications approximately 1 week ago.  Due to typical anginal chest  pain, multiple risk factors, I discussed with the patient regarding left  cath, possible PTCA stenting, its risks and benefits i.e., death, MI,  stroke, need for emergency CABG, risk of restenosis, local vascular  complications etc. and consented for the procedure.   PROCEDURE:  After obtaining the informed consent, the patient was  brought to  the cath lab and was placed on fluoroscopy table.  Right  groin was prepped and draped in usual fashion.  Xylocaine 2% was used  for local anesthesia in the right groin.  With the help of thin-wall  needle, 6-French arterial sheath was placed.  The sheath was aspirated  and flushed.  Next, 6-French left Judkins catheter was advanced over the  wire under fluoroscopic guidance up to the ascending aorta.  Wire was  pulled out, the catheter was aspirated and connected to the manifold.  Catheter was further advanced and engaged into left coronary ostium.  Multiple views of the left system were taken.  Next, the catheter was  disengaged and was pulled out over the wire and was replaced with 6-  [place] right Judkins catheter which was advanced over the wire under  fluoroscopic guidance up to the ascending aorta.  Wire was pulled out,  the catheter was aspirated and connected to the manifold.  Catheter was  further advanced and engaged into right coronary ostium.  Multiple views  of the right system were taken.  Next, catheter was disengaged and was  pulled out over the wire and was replaced with  6-French pigtail catheter  which was advanced over the wire under fluoroscopic guidance up to the  ascending aorta.  Wire was pulled out, the catheter was aspirated and  connected to the manifold.  Catheter was further advanced across the  aortic valve into the LV.  LV pressures were recorded.  Next, LV graft  was done in 30 degrees RAO position.  Post angiographic pressures were  recorded from LV and then pullback pressures were recorded from the  aorta.  There was no gradient across the aortic valve.  Next, a pigtail  catheter was pulled out over the wire.  Sheaths were aspirated and  flushed.   FINDINGS:  LV showed inferior wall, mid and basal hypokinesia.  EF of 50-  55%.  Left main was long which was patent.  LAD has 10-20% ostial and  proximal stenosis and 20-25% mid stenosis.  Diagonal 1 has  60-70% ostial  and 40-50% mid stenosis.  Ostial lesion is at acute angle and the vessel  is very small, not suitable for PCI.  Ramus has 20-30% proximal stenosis  which is a large vessel.  Left circumflex is small which is patent.  RCA  has 20-30%  sequential mid stenosis and 30-40% focal mid and distal edge in-stent  restenosis with TIMI grade 3 distal flow.  PDA and PLV branches were  small which were patent.  The patient tolerated procedure well.  There  were no complications.  The patient was transferred to recovery room in  stable condition.      Eduardo Osier. Sharyn Lull, M.D.  Electronically Signed     [facility]MED[facility]  D:  12/22/2008  T:  12/22/2008  Job:  [id number]   cc:   Cath Lab

## 2010-09-27 NOTE — Discharge Summary (Signed)
NAMEEULALIO, REAMY                  ACCOUNT NO.:  [id number]   MEDICAL RECORD NO.:  [id number]          PATIENT TYPE:  INP   LOCATION:  3703                         FACILITY:  [facility]   PHYSICIAN:  Mohan N. Sharyn Lull, M.D. DATE OF BIRTH:  [birth year]   DATE OF ADMISSION:  12/21/2008  DATE OF DISCHARGE:  12/23/2008                               DISCHARGE SUMMARY   ADMITTING DIAGNOSES:  1. Chest pain, rule out myocardial infarction.  2. Coronary artery disease.  3. 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  4. Status post percutaneous transluminal coronary angioplasty and      stenting to mid and distal junction of right coronary artery.  5. Status post supraventricular tachycardia.  6. Hypertension.  7. Hypercholesteremia.  8. 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  9. Tobacco abuse.  10.History of peptic ulcer disease.  11.Degenerative joint disease.   DISCHARGE HOME MEDICATIONS:  1. Enteric-coated aspirin 81 mg 1 tablet daily.  2. Plavix 75 mg 1 tablet daily.  3. Toprol-XL 25 mg 1 tablet daily.  4. Lipitor 40 mg 1 tablet daily.  5. Prilosec 20 mg 1 capsule daily.  6. Ramipril 5 mg 1 capsule daily.  7. Nicotine patch 14 mg per 24 hours daily.   DIET:  Low salt, low cholesterol.   ACTIVITY:  Increase activity slowly as tolerated.  Post cardiac cath  instructions have been given.   FOLLOWUP:  Follow up with me in 1 week and Dr. Johney Frame on Wednesday,  January 13, 2009 at 11:15 a.m. at [facility].   DISCHARGE INSTRUCTIONS:  The patient has been advised to call 911 if he  develops any recurrent palpitations or dizziness or lightheadedness.   CONDITION ON DISCHARGE:  Stable.   BRIEF HISTORY AND HOSPITAL COURSE:  [name] is a 60 year old black male  with past medical history significant for coronary artery disease,  history of inferior wall MI in the past status post PTCA and stenting to  RCA, hypertension, history of SVT in the past,  hypercholesteremia,  tobacco abuse, history of peptic ulcer disease, history of cocaine and  marijuana abuse, degenerative joint disease, positive family history of  coronary artery disease.  He came to the ER via EMS complaining of vague  left-sided chest pain associated with numbness in both hands associated  with palpitation, lightheadedness, weakness, and diaphoresis.  He took 1  sublingual nitro and aspirin and was noted to be in SVT.  The patient  received carotid sinus massage without effect and then received 12 mg of  IV adenosine with conversion to sinus rhythm.  Denies any recent cocaine  abuse.  Denies chest pain at present.  States he ran out of all  medicines approximately 1 week ago.   PAST MEDICAL HISTORY:  As above.   PAST SURGICAL HISTORY:  He had laparotomy for peptic ulcer disease  approximately 15+ years ago.   ALLERGIES:  No known drug allergies.   MEDICATIONS AT HOME:  He was on aspirin, Plavix, Toprol, and Lipitor.   SOCIAL HISTORY:  He is married,  has 14 children.  Smoked one to one-and-  half pack per day for 20+ years and now smokes few cigarettes per day.  Drinks beer occasionally socially and marijuana occasionally.  Used to  work for [employer].  The patient smoked cocaine in the past many  months ago.   FAMILY HISTORY:  Father died of MI at the age of 77.  He also had  cirrhosis of liver.  Mother died of MI at the age of 28.  One sister had  MI at the age of 65 and one brother had MI in his 10s.   PHYSICAL EXAMINATION:  GENERAL:  He is alert, awake, and oriented x3 in  no acute distress.  VITAL SIGNS:  Blood pressure was 137/83, pulse was 63 and regular.  HEENT:  Conjunctivae were pink.  NECK:  Supple.  No JVD, no bruit.  LUNGS:  Clear to auscultation without rhonchi or rales.  CARDIOVASCULAR:  S1-S2 was normal.  There was a soft systolic murmur.  There is no S3 gallop.  ABDOMEN:  Soft, bowel sounds present, and nontender.  EXTREMITIES:  There  is no clubbing, cyanosis, or edema.   LABORATORY DATA:  His urine drug screen was negative for cocaine.  Hemoglobin was 12.5, hematocrit 38.4, and white count of 6.7.  His  sodium was 139, potassium 3.7, chloride 97, bicarb 23, BUN 10,  creatinine 0.9, and magnesium was 2.0.  Three sets of cardiac enzymes  were negative.  TSH was in normal range 0.442.  His total cholesterol  was 131, LDL 59, HDL was 64, and triglycerides were 42.  Chest x-ray  showed no acute infiltrates or edematous changes.   BRIEF HOSPITAL COURSE:  The patient was admitted to Telemetry Unit.  The  patient was started on low-dose beta-blockers and aspirin and nitrates.  The patient did not have any further episodes of chest pain during the  hospital stay.  The patient subsequently underwent left cardiac cath  with selective left and right coronary angiography as per procedure  report.  The patient was noted to have mild in-stent restenosis in the  RCA with a fairly good LV systolic function.  EP consultation was  obtained with Dr. Johney Frame for possible SVT ablation.  The patient at  present refused for radiofrequency ablation and EP studies, but would  consider as outpatient in future if he has  recurrent episodes of tachycardia.  The patient has been advised to  refrain from smoking and doing cocaine and drinking alcohol to which he  agrees.  The patient will be discharged home on above medications and  will be followed up in my office in 1 week and Dr. Johney Frame in 2-3 weeks  as scheduled.      Eduardo Osier. Sharyn Lull, M.D.  Electronically Signed     Eduardo Osier. Sharyn Lull, M.D.  Electronically Signed    MNH/MEDQ  D:  12/23/2008  T:  12/23/2008  Job:  [id number]

## 2010-09-27 NOTE — Consult Note (Signed)
[name], [name] NO.:  [id number]   MEDICAL RECORD NO.:  [id number]          PATIENT TYPE:  INP   LOCATION:  3703                         FACILITY:  [facility]   PHYSICIAN:  Hillis Range, MD       DATE OF BIRTH:  1951/02/21   DATE OF CONSULTATION:  12/22/2008  DATE OF DISCHARGE:                                 CONSULTATION   REQUESTING PHYSICIAN:  Mohan N. Sharyn Lull, MD   REASON FOR CONSULTATION:  Supraventricular tachycardia.   HISTORY OF PRESENT ILLNESS:  [name] is a pleasant 60 year old  gentleman with a history of known coronary artery disease who was  admitted with symptomatic supraventricular tachycardia.  The patient  reports being in his usual state of health until December 21, 2008, when he  developed abrupt onset of heart racing.  He reports associated  diaphoresis, chest discomfort, and lightheadedness.  He reports becoming  very weak.  He took a nitroglycerin without improvement in his symptoms.  He, therefore, presented to [facility] Emergency Department where he was  found to have a short RP tachycardia at 160 beats per minute.  He  received 12 mg of intravenous adenosine with immediate termination of  his tachycardia.  He has had no tachycardias thereafter.  The patient  has been placed on metoprolol 12.5 mg twice daily.  He has previously  taken 12.5 mg daily and has had bradycardia for which he is relatively  asymptomatic.  He reports a longstanding history of brief palpitations  lasting several seconds at a time, but he denies any prior sustained  SVT.  He underwent left heart catheterization today, which revealed an  ejection fraction of 50-55% with inferior mid and basal hypokinesis.  The patient was noted to have nonobstructive coronary artery disease and  medical therapy was recommended.  The patient is presently resting  comfortably and is without complaint.  He denies any prior presyncopal  or syncopal episodes.   PAST MEDICAL HISTORY:  1. Coronary artery disease status post PCI to the RCA in April 2008.  2. Hypertension.  3. Hyperlipidemia.  4. Tobacco use.  5. History of peptic ulcer disease and perforation.  6. History of cocaine and marijuana use.  7. Degenerative joint disease.   ALLERGIES:  No known drug allergies.   HOME MEDICATIONS:  1. Metoprolol 12.5 mg daily.  2. Aspirin 81 mg daily.  3. Lipitor daily.  4. Plavix 75 mg daily.   SOCIAL HISTORY:  The patient lives in [place] with his significant  other.  He has a history of tobacco and continues to smoke less than one-  half pack per day.  He has a remote history of polysubstance abuse.  He  denies significant alcohol consumption.   FAMILY HISTORY:  The patient has multiple family members with a history  of coronary artery disease.  His mother died at age 88 of an MI, and his  brother also died at 31 of an MI.  He reports several cousins with  sudden death but is unable to describe this further.  His father died of  cancer but had coronary artery disease.   REVIEW OF SYSTEMS:  All systems are reviewed and negative except as  outlined in the HPI above.   PHYSICAL EXAMINATION:  Telemetry reveals sinus rhythm.  VITAL SIGNS:  Blood pressure 138/70, heart rate 60, respirations 18,  sats 98% on room air, afebrile.  GENERAL:  This patient is a well-appearing male in no acute distress.  He is alert and oriented x3.  HEENT:  Normocephalic, atraumatic.  Sclerae clear.  Conjunctivae pink.  Oropharynx clear.  NECK:  Supple.  No thyromegaly, JVD, or bruits.  LUNGS:  Clear to auscultation bilaterally.  HEART:  Regular rate and rhythm, 1/6 systolic ejection murmur along the  left sternal border.  GI:  Soft, nontender, nondistended.  Positive bowel sounds.  EXTREMITIES:  No clubbing, cyanosis, or edema.  There is no hematoma or  bruit to the right groin.  SKIN:  No ecchymoses or lacerations.  MUSCULOSKELETAL:  No deformity or atrophy.  PSYCH:  Euthymic  mood.  Full affect.   EKG reveals sinus bradycardia at 54 beats per minute with biatrial  enlargement and left ventricular hypertrophy with early repolarization  changes.  The PR duration is 148 milliseconds, QRS duration 186  milliseconds, and QT interval 420 milliseconds.   I have reviewed telemetry strips of the patient's supraventricular  tachycardia, which clearly document a short RP tachycardia with an RSR  prime in V1 and a pseudo S-wave in leads II, III, and aVF which  terminated with adenosine   Left heart catheterization as described above.   Chest x-ray from December 21, 2008, reveals no acute airspace disease.   LABORATORY DATA:  Creatinine 1.1.  White blood cell count 6.2,  hematocrit 36.7, platelets 244.  TSH 0.442.  Drugs screen is positive  for tetra, hydro, cannabinol.   IMPRESSION:  [name] is a pleasant 60 year old gentleman with a history  of known coronary artery disease who is admitted with an episode of  symptomatic supraventricular tachycardia.  He has a documented short RP  tachycardia, which is likely of a reentrant mechanism, which terminated  with adenosine.  He is maintained in sinus rhythm since that time.  He  had a left heart catheterization today, which revealed no significant  coronary artery disease requiring any intervention.   PLAN:  Therapeutic strategies for supraventricular tachycardia were  discussed in detail with the patient today including both medicine and  catheter-based therapies.  Risks, benefits, and alternatives to EP study  and radiofrequency ablation for supraventricular tachycardia were also  discussed in detail.  The patient understands that the risk include but  are not limited to bleeding, vascular damage, perforation, tamponade,  damage to the normal conduction system requiring a pacemaker, death,  heart attack, and stroke.  He understands these risk.  He would prefer  to continue medical therapy at this time.  Medications  have been limited  due to bradycardia; however, he is presently tolerating metoprolol 12.5  mg twice daily, which is twice to his prior home dose.  I would,  therefore,  recommend that he continue metoprolol 12.5 mg twice daily.  I would be  happy to see the patient again as an outpatient to further discuss  catheter ablation as an alternative; however, if he wishes to continue  medical therapies, then he will need to follow closely with Dr. Sharyn Lull.      Hillis Range, MD  Electronically Signed     JA/MEDQ  D:  12/22/2008  T:  12/23/2008  Job:  [id number]   cc:   Eduardo Osier. Sharyn Lull, M.D.

## 2010-09-30 NOTE — Cardiovascular Report (Signed)
[name], [name] NO.:  [id number]   MEDICAL RECORD NO.:  [id number]          PATIENT TYPE:  INP   LOCATION:  2902                         FACILITY:  [facility]   PHYSICIAN:  Eduardo Osier. Sharyn Lull, M.D. DATE OF BIRTH:  November 24, 1950   DATE OF PROCEDURE:  09/03/2006  DATE OF DISCHARGE:                            CARDIAC CATHETERIZATION   PROCEDURE:  1. Left cardiac catheterization with selective left and right coronary      angiography, left ventriculography via right groin using Judkins      technique.  2..  Insertion of temporary transvenous pacemaker via right femoral  venous approach.  1. Successful PTCA to mid and distal junction of RCA using 2.5 x 8 mm      long Voyager balloon.  2. Successful deployment of 3 x 28-mm long Cypher drug-eluting stent      in mid and distal junction of RCA.  3. Successful postdilatation of this stent using 3.25 x 13 mm long      Powersail balloon.   INDICATIONS FOR PROCEDURE:  [name] is a 60 year old black male with  past medical history significant for coronary artery disease,  hypertension, tobacco abuse, history of peptic ulcer disease, history of  cocaine and marijuana abuse, degenerative joint disease.  He came to the  ER via EMS, complaining of retrosternal chest tightness, grade 10/10,  which started around 7:30 a.m., associated with nausea and numbness in  both arms.  EKG done in the ER showed sinus bradycardia with first-  degree AV block, with ST elevation in II, III, aVF, ST depression in V1-  V6, and there is focal ST depression in I and aVL, suggestive of  inferoposterolateral injury pattern.  The patient denied any recent  cocaine abuse, but states he occasionally smokes marijuana.   PAST MEDICAL HISTORY:  As above.   PAST SURGICAL HISTORY:  He had peptic ulcer repair approximately 15+  years ago.   ALLERGIES:  NO KNOWN DRUG ALLERGIES.   MEDICATIONS AT HOME:  1. Norvasc 5 mg p.o. daily.  2. Hydrochlorothiazide  25 mg p.o. daily.  3. Aspirin 81 mg p.o. daily.   SOCIAL HISTORY:  He works as a Public librarian.  Smoked a half pack per day for  40+ years.  Drinks beer socially, and smokes marijuana occasionally.  States he did cocaine more than 10 years ago.   FAMILY HISTORY:  Father died of MI at the age of 42.  He also had  cirrhosis of liver. Mother died of MI at the age of 54.  One sister had  MI in her 30s. One brother had MI in his 66s.   PHYSICAL EXAMINATION:  GENERAL:  He was alert and oriented x3, in no  acute distress complaining of retrosternal chest pain.  VITAL SIGNS:  Blood pressure was 104/60, pulse was 50, sinus bradycardia  on the monitor.  HEENT:  Conjunctiva was pink.  NECK:  Supple.  No JVD.  LUNGS:  Decreased breath sounds heard at bases.  CARDIOVASCULAR:  S1, S2 was normal.  There was a soft S4 gallop.  ABDOMEN:  Soft.  Bowel sounds were present, nontender.  EXTREMITIES:  There was no clubbing, cyanosis or edema.   Discussed with the patient regarding emergency catheterization, possible  PTCA and stenting, its risks and benefits, i.e. death, MI, stroke, need  for emergency CABG, risk of restenosis, local vascular complications.  Accepted and consented for the procedure.   PROCEDURE:  After obtaining informed consent, the patient was brought to  the catheterization lab and was placed on the fluoroscopy table.  The  right groin was prepped and draped in the usual fashion. With the help  of a thin-wall needle, 6-French arterial and 6-French venous sheaths  were placed. Both the sheaths were aspirated and flushed.  Next, a 6-  French left Judkins catheter was advanced over the wire under  fluoroscopic guidance up to the ascending aorta.  Wire was pulled out.  The catheter was aspirated and connected to the manifold.  Catheter was  further advanced and engaged into the left coronary ostium.  Multiple  views of the left system were taken.  Next, the catheter was disengaged  and was  pulled out over the wire and was replaced with 6-French right  Judkins catheter which was advanced over the wire under fluoroscopic  guidance up to the ascending aorta.  Wire was pulled out. The catheter  was aspirated and connected to the manifold.  Catheter was further  advanced and engaged into the right coronary ostium.  Multiple views of  the right system were taken.  Next, the catheter was disengaged and was  pulled out over the wire and was replaced with a 6-French pigtail  catheter. At the end of the procedure, catheter was advanced over the  wire under fluoroscopic guidance up to the ascending aorta.  Catheter  was further advanced across the aortic valve into the LV.  LV pressures  were recorded.  Next. left ventriculography was done in 30-degree RAO  position.  Post-angiographic pressures were recorded from LV, and then  pullback pressures were recorded from aorta.  There was no gradient  across the aortic valve.  Next, the pigtail catheter was pulled out over  the wire.  Sheaths were aspirated and flushed.   FINDINGS:  The LV showed mild inferior wall hypokinesia, EF of 50-55%.  Left main was long, which was patent.  LAD has 10-15% proximal stenosis  and 20-25% mid stenosis.  Diagonal 1 has 40-50% ostial stenosis, which  is a small vessel.  Ramus was moderate size, which has 30-40% ostial and  proximal stenosis.  Left circumflex was small, which was patent.  RCA  has 20-25% proximal and mid stenosis, and then was 100% occluded beyond  the midportion with TIMI 0 flow.  Temporary transvenous pacer was  inserted via right femoral approach prior to PCI.  The patient required  1 mg of atropine, as the patient had 2:1 heart block with narrow complex  with this, and then subsequently converted to first-degree AV block.   INTERVENTIONAL PROCEDURE:  Successful PTCA to mid and distal junction of RCA was done using 2.5 x 8 mm long Voyager balloon for predilatation,  and then 3 x 28 mm  long Cypher drug-eluting stent was deployed at 13  atmospheres pressure, and mid and distal junction of RCA stent was  postdilated using 3.25 x 13 mm long Powersail balloon.  Lesion was  dilated from 100% to 0% residual, with excellent TIMI grade 3 distal  flow without evidence of dissection or distal embolization.  The patient  received weight-based heparin, Integrilin, plus 300 mg of Plavix, total  of 600 of Plavix during the procedure.   The patient tolerated the procedure well.  There were no complications.  He was transferred to the recovery room in stable condition.   Of note, the temporary pacer was discontinued at the end of the  procedure.           ______________________________  Eduardo Osier. Sharyn Lull, M.D.     [facility]/MEDQ  D:  09/04/2006  T:  09/04/2006  Job:  [id number]   cc:   Catheterization Laboratory

## 2010-09-30 NOTE — H&P (Signed)
[name], DUTKO NO.:  [id number]   MEDICAL RECORD NO.:  [id number]          PATIENT TYPE:  EMS   LOCATION:  MAJO                         FACILITY:  [facility]   PHYSICIAN:  Santiago Bumpers. Hensel, M.D.DATE OF BIRTH:  10/06/1950   DATE OF ADMISSION:  07/04/2005  DATE OF DISCHARGE:                                HISTORY & PHYSICAL   PRIMARY MEDICAL DOCTOR:  The patient is unassigned.   CHIEF COMPLAINT:  Right-sided weakness and chest pain.   BRIEF HISTORY OF PRESENT ILLNESS:  The patient is a 60 year old African-  American male with a past medical history significant for cardiac  catheterization in 2001, who presented with ST elevation in V1 and V3, who  presents today with a 3-day history of right-sided foot weakness,  paresthesias in the toes and heel of the right foot, and now worsening right  leg weakness.  The patient had worsening of pain at night, and had to  elevate his foot at bedtime; however, he had somewhat improvement with his  right-sided weakness during the day.  The pain was not terrible to the fact  that he did not come in over the weekend.  However, this morning he went to  work around 6:30, had some stabbing chest pain that did not radiate and was  non-reproducible (it went away on its own), and then developed dizziness  were he felt like he needed to go outside and take a breath of fresh air.  He felt as if he was going to pass out; however, he did not lose  consciousness.  All of a sudden to him, the right-sided leg weakness  worsened, he developed what felt like dead leg to him, and he went ahead and  came on into the hospital.  He has no history of a CEA, no history of MI in  the past; however, there is some question secondary to an old discharge  summary.  He did have a clean cath in 2001.  He does have a positive history  of tobacco.  He does have a positive history of marijuana.  He has a history  of cocaine usage; however, the patient states  that he has not used it in the  last 7 years.  The patient states his leg feels dead and has to pick it up  to walk.  It is more so weakness and pins and needles noted in the toes and  heels of his right foot, as well as digits 3, 4 and 5 in his right upper  extremity.   REVIEW OF SYSTEMS:  Negative for nausea, vomiting and diarrhea.  Negative  headache.  Positive runny nose.  No history of hematuria or melena.  No  history of a CVA in the past.  Otherwise, pertinent medical history is  otherwise negative.   PAST MEDICAL HISTORY:  1.  History of drug abuse, cocaine and marijuana.  2.  Tobacco abuse.  3.  Strong family history of CAD.  4.  History of chest pain.  5.  History of GERD.   PAST SURGICAL HISTORY:  1.  Cardiac catheterization in 2001, which was clean.  2.  There was a history of abdominal surgery in 1998, which is unknown      etiology.   MEDICINES AT HOME:  He is currently taking none; however, he was previously  prescribed, back in 2001, Norvasc and aspirin daily.   ALLERGIES:  No known drug allergies.   SOCIAL HISTORY:  He lives with his lady friend.  He has positive alcohol, 1  to 2 beers a day.  Positive tobacco and positive marijuana use, with the  last use yesterday.  No recent usage of cocaine.   FAMILY HISTORY:  Mother died at age 91 due to cardiac MI.  Father died at  age 10 of MI, emphysema and skin cancer.   PHYSICAL EXAMINATION:  VITAL SIGNS:  On admission, temperature was 97.1,  respirations were 20, heart rate 58, blood pressure 150/84; repeat was  142/82.  Pulse oximetry was 98% on room air.  GENERAL:  He had no acute distress, no slurred speech.  He was able to  converse appropriately.  HEENT:  Normocephalic and atraumatic.  Extraocular muscles intact.  Pupils  equal, round and reactive to light.  Dry mucous membranes.  Oropharynx was  benign without lesions.  Sensation was intact throughout his face and in all  distributions.  He had normal  movement of his facial expressions.  CARDIOVASCULAR:  Regular rate and rhythm.  No murmurs, rubs, or gallops.  No  carotid bruits.  No JVD.  LUNGS:  Clear to auscultation bilaterally with good symmetrical movement.  ABDOMEN:  Soft, nontender on palpation of the right lower quadrant.  No  obvious hernias.  Positive bowel sounds.  EXTREMITIES:  There is right-sided weakness in the right leg with walking.  It felt as if the patient had to pick up his leg; however, there was not an  obvious foot drop at that time.  He did have an actual limp, though.  He had  5/5 strength against gravity and against resistance of his upper extremities  and lower extremities bilaterally.  There was a question of upper extremity  right-sided weakness that was obtained from Dr. Verlan Friends with anterior  shoulder on physical exam; however, this was not obtained on my physical  examination.  His right toes, heel and right digits 3-5 had positive  paresthesias of pins and needle.  He had +2 reflexes bilaterally in the  upper extremities and lower extremities.  GU:  No masses, no inguinal hernias.  Fecal occult blood negative.  NEUROLOGIC:  He is alert an oriented x3 and has slurred speech.  Cranial  nerves II-XII intact.  He walks with a limp, but dead leg sensation and foot  drop.  Unable to walk on heel secondary to a bad heed.  He had no real  proprioception, negative Romberg.   LABORATORY DATA:  EKG with no acute changes, no ST elevations.  There were  peaked T waves in V3 and F4, and bradycardia, but no associated EKG changes  significant from his previous one.  Troponin I was less than 0.05.  CK-MB  was 1.1.  Myoglobin was 55.6.  Urinalysis was negative.  White count was  7.1, hemoglobin 14.4, hematocrit 44.5, platelets 317.  Sodium 138, potassium  4.1, chloride 106, bicarbonate 26.7 (slightly elevated), BUN 6, creatinine 1.0 and glucose of 90.  CT of the head was negative for acute bleed.  Chest  x-ray showed  COPD changes; however, no acute process going on.  ASSESSMENT AND PLAN:  A 60 year old with right-sided leg weakness,  paresthesias, right finger numbness and chest pain.  1.  Right-sided weakness.  There is a questionable etiology, most concerning      for a CVA; however, it was 3 days ago, approximately 72 hours.  He is      currently not a candidate for thrombolysis.  It is not likely a      transient ischemic attack secondary to non-resolving paresthesias and      weakness.  There is a questionable legion distally versus unilateral      compression neuropathy for differential diagnoses versus an atypical      form of sciatica.  There are no new rashes.  The polyneuropathy is also      a consideration, but it is not likely, secondary to the has had no      history of diabetes.  His ethanol usage positive; however, sounds      minimal.  Ethanol was less tan 5 on admission.  We will go ahead and      check negative MRI/MRA for questionable stroke.  There was no old      infarction seen on PT, but no bleed either.  We will go ahead and obtain      an echo and carotid Dopplers and complete cerebrovascular accident work-      up.  We will obtain a TSH.  Will follow for worsening signs and      symptoms.  PT and OT will be consulted for rehabilitation, and we are      going to go ahead and begin a 325 mg aspirin daily.  2.  Chest pain.  EKG unchanged, except for peaked T waves in V3 and V4.      Will completely rule out myocardial infarction with cardiac enzymes and      repeat EKG in the morning.  Currently, there is no chest pain, so we      will not give him morphine or nitroglycerin at this time, unless the      pain worsens.  We will go ahead and check a TSH.  3.  History of drug abuse.  Consider nicotine patch in the a.m.  There is a      minimal history of ethanol usage, with no __________  at this time.  4.  Right lower quadrant abdominal pain.  We will follow.  No obvious signs       of a hernia, ventral or inguinal.  5.  Hypertension, currently stable.  No medicines at this time.  6.  FENGI, stable.  Will continue him on a regular diet.  Heplock his IV      fluids.  7.  PT and OT.  We will have them consult to arrange rehabilitation on that      right side.  8.  Deep vein thrombosis prophylaxis.  The patient __________  ambulatory.      The patient with diskitis.  9.  The patient is a full code.   FOLLOW UP:  The patient is without a primary M.D. at this time.      Barth Kirks, M.D.    ______________________________  Santiago Bumpers. Leveda Anna, M.D.    MB/MEDQ  D:  07/04/2005  T:  07/05/2005  Job:  [id number]

## 2010-09-30 NOTE — Discharge Summary (Signed)
[name], [name] NO.:  [id number]   MEDICAL RECORD NO.:  [id number]          PATIENT TYPE:  INP   LOCATION:  4734                         FACILITY:  [facility]   PHYSICIAN:  Pearlean Brownie, M.D.DATE OF BIRTH:  Oct 14, 1950   DATE OF ADMISSION:  07/04/2005  DATE OF DISCHARGE:  07/05/2005                                 DISCHARGE SUMMARY   PRIMARY PHYSICIAN:  Unassigned.   DISCHARGE DIAGNOSES:  1.  Nerve root irritation secondary to low back pain.  2.  Muscular spasms.  3.  Chest pain, reproducible, most likely secondary to musculoskeletal.  4.  Known coronary artery disease.  5.  History of ST elevations in V2, V3 and V4, chronic elevations, on EKGs.  6.  History of polysubstance, cocaine and marijuana.   DISCHARGE MEDICATIONS:  1.  Flexeril 10 mg p.o. b.i.d.  2.  Ibuprofen 800 mg one t.i.d. p.r.n.  3.  Aspirin 81 mg daily.   PROCEDURES:  1.  CT of the head was negative.  2.  MRI/MRA of the head showed no acute intracranial abnormalities,      scattered sinus disease, maxillary sinuses.  Negative MRA for circle of      Willis, negative bleed, negative CVA.  3.  Chest x-ray showed no acute infiltrative process, COPD changes.   DISCHARGE LABORATORY DATA:  Cardiac enzymes:  CK total 127, then 116, CK-MB  was 1.4, then 1.4, troponin remained less than 0.01 and 0.01.  Relative  index 1.1 and 1.2.  Two point of care markers in the ED were also negative.  Hemoglobin was 13.1, white count 7.4, hematocrit of 40.6, platelets 316.  Sodium 137, potassium 3.9, chloride 104, bicarb 27, BUN 9, creatinine 1.1.  Platelets 97.  Calcium 8.7.  Total bilirubin 0.8, alkaline phosphatase 39,  AST 19, ALT 13, total protein 5.6, albumin 3.1.  Total cholesterol 136,  triglycerides 79, HDL 58, LDL 70.   BRIEF HOSPITAL COURSE:  The patient is a 60 year old African-American male  who presented with a three-day history of right-sided weakness and  paresthesias as well as  right-sided pain.  Please see the dictated H&P for  further details.   HOSPITAL COURSE:  The patient was admitted to [facility] for  concern for a CVA.  CT of the head showed negative for acute stroke.  Further report from this patient, he had had this for three-day history.  MRI/MRA was obtained, which showed negative for acute intracranial  abnormalities.  Upon further questioning, the patient's wife and the patient  elicited the information that this had actually been going on for two weeks  with right-sided weakness in the foot with some paresthesias of pins and  needles in the toe and heel with improvement today into the toes and also  muscular back pain with muscle twitching on the right side with CVA  tenderness.  The patient had noted pain and paresthesias that shot down the  leg with palpation of the right CVA and back region.  The patient did not  have foot drop on the day of discharge and  actually had improvement in his  paresthesias, only to the right toe.  The patient had a negative straight  leg test.  Carotid Dopplers and echo were ordered; however, these were  discontinued after MRI/MRA was negative as well as pain being associated  solely to the back and foot.  At this time it was decided that the patient  was having radicular symptoms and especially with a two-week history and  that this was most likely secondary to some nerve root irritation and some  musculoskeletal spasms.  The patient also denied having chest pain.  While  as an inpatient in the hospital, he was treated for a rule-out MI.  Cardiac  enzymes were negative x3 as well as EKG showed chronic elevation of the ST  segment in V2, V3 and V4.  This was consistent with an old EKG performed in  2005 in September.  This will need to be noted on his __________.  The  patient also complained of some paresthesias in digits 3, 4 and 5 on the  right hand and had some numbness associated with it, somewhat  consistent  with an ulnar nerve entrapment syndrome; however, with the third and fourth  fingers being elicited, may also be similar to a median nerve carpal tunnel  syndrome.  The patient has a known history of using vibratory machines with  sanding, daily usage.  The patient states that the sensory pain has been  going on for three weeks.  The patient's vital signs were stable, 98.3,  pulse 58, respirations 20, blood pressure was 123/60.  He was saturating 96%  on room air.  The patient denied any chest pain at time and upon palpation,  chest pain was reproducible in the musculoskeletal region.  It was thought  that his chest pain was most likely secondary to musculoskeletal pain.  The  patient will be sent home on ibuprofen as well as Flexeril for muscle  spasms.  The patient was given and made an appointment with [facility] for follow-up of the sensory loss and to see if we had noted  improvement with medical management.  The patient also has a known history  of coronary artery disease and will need a follow-up stress test.   DISCHARGE PROBLEMS:  1.  Radicular symptoms with nerve root irritation.  Will go ahead and send      him home with Flexeril and NSAIDs.  Will follow him at the clinic on      February July 15, 2005, at 10:15 by Dr. Barth Kirks at [facility].  At this time we will not obtain any further imaging.      On follow-up, if patient's symptoms worsen would consider x-ray as well      as MRI of the back to make sure that there is no nucleus pulposus      herniation or nerve root compression.  This is most likely not sciatica      secondary to the fact that the distribution is not in multiple nerves;      however, he does have some radiating pain with palpation.  Will see if      medical management improves.  2.  Chest pain.  This is most likely secondary to musculoskeletal; however,     the patient has known coronary artery  disease.  Had a catheterization      performed in 2001 with some stenosis at 30% as  dictated in the consult      note.  The patient will be sent home on a daily aspirin.  The patient      will need a follow-up stress test once his back pain and leg pain have      resolved.  His lipid profile was obtained, which was good, as well as      his blood pressure management was good.  Attempted to have smoking      cessation; however, the patient states he is not willing to quit at this      time.  3.  Right hand weakness.  This is most likely secondary to ulnar entrapment      syndrome or carpal tunnel syndrome.  We will follow it up with NSAIDs      and follow up at that time.  The patient may need a splint at follow-up      for the pain.   FOLLOW-UP:  The patient has a scheduled follow-up for [facility], Dr. Barth Kirks, on July 19, 2005, at 10:15 a.m.      Barth Kirks, M.D.    ______________________________  Pearlean Brownie, M.D.    MB/MEDQ  D:  07/05/2005  T:  07/06/2005  Job:  [id number]   cc:   Barth Kirks, M.D.  Fax: [phone]

## 2010-09-30 NOTE — Cardiovascular Report (Signed)
[place]  Patient:    [name], [name]                           MRN: [id number] Proc. Date: 11/23/99 Attending:  Eduardo Osier. Sharyn Lull, M.D. CC:         Cardiac Catheterization Lab                        Cardiac Catheterization  PROCEDURE:  Left cardiac catheterization with selective left and right coronary angiography, left ventriculogram via right groin using Judkins technique.  INDICATION FOR PROCEDURE:  [name] is a 60 year old black male with a past medical history significant for MI approximately five years ago, a history of peptic ulcer disease, a history of tobacco abuse, cocaine abuse in the past, strong family history of coronary artery disease.  He came to the ER via EMS complaining of retrosternal and precordial chest pain radiating to the left arm associated with diaphoresis while at work, grade 9/10.  He received two sublingual nitroglycerin and four baby aspirin with partial relief of chest pain.  EKG done in the ER showed normal sinus rhythm with ST elevation in V1 to V3 and T wave inversion in inferolateral leads with LV ______ pattern.  The patient denies any cardiac workup for prior MIs.  Denies exertional chest pain in recent past.  States was using ______ on July 4 on a long weekend.  Used to do cocaine.  Last abuse was approximately one year. Denies recent cocaine abuse.  Due to typical anginal pain, strong family history, multiple risk factors and EKG suspicious for MI, the patient was advised for left catheterization and possible angioplasty.  DESCRIPTION OF PROCEDURE:  After obtaining informed consent, the patient was brought to the catheterization lab and was placed on the fluoroscopy table. The right groin was prepped and draped in the usual fashion.  Xylocaine, 2%, was used for local anesthesia in the right groin.  With the help of a thin-walled needle, a 6-French arterial sheath was placed.  The sheath was aspirated  and flushed.  Next, a 6-French left Judkins catheter was advanced over the wire under fluoroscopic guidance up to the ascending aorta.  The wire was pulled out.  The catheter was aspirated and connected to the manifold. The catheter was further advanced and engaged into the left coronary ostium. Multiple views of the left system were taken.  Next, the catheter was disengaged and was pulled out over the wire and was replaced with a 6-French right Judkins catheter which was advanced over the wire under fluoroscopic guidance up to the ascending aorta.  The wire was pulled out.  The catheter was aspirated and connected to the manifold.  The catheter was further advanced and engaged into the right coronary ostium.  Multiple views of the right system were taken.  Next, the catheter was disengaged and was pulled out over the wire and was replaced with a 6-French pigtail catheter which was advanced over the wire under fluoroscopic guidance up to the ascending aorta. The wire was pulled out.  The catheter was aspirated and connected to the manifold.  The catheter was further advanced across the aortic valve into the LV.  LV pressures were recorded.  Next, a left ventriculogram was done in 30-degree RAO position.  Post angiographic pressures were recorded from the LV and then pullback pressures were recorded from the aorta.  There was no  gradient across the aortic valve.  Next, the pigtail catheter was pulled out. The sheaths were aspirated and flushed.  The patient tolerated the procedure well.  FINDINGS:  The patient has good LV systolic function. 1. The left main was patent. 2. The LAD has 10-15% mid stenosis.  Diagonal #1 and diagonal #2 are small    which were patent. 3. The left circumflex was small which was patent.  The ramus was large    which was patent. 4. The RCA has 30-40% mid stenosis.  There was TIMI grade 2 flow which    improved after intracoronary nitroglycerin into the  RCA.  The patient tolerated the procedure well.  There were no complications.  The patient was transferred to the telemetry unit in stable condition. DD:  04/11/00 TD:  04/11/00 Job: [id number]

## 2010-09-30 NOTE — Discharge Summary (Signed)
NAMEMARITZA, [name]                  ACCOUNT NO.:  [id number]   MEDICAL RECORD NO.:  [id number]          PATIENT TYPE:  INP   LOCATION:  3735                         FACILITY:  [facility]   PHYSICIAN:  Mohan N. Sharyn Lull, M.D. DATE OF BIRTH:  April 28, 1951   DATE OF ADMISSION:  09/03/2006  DATE OF DISCHARGE:  09/07/2006                               DISCHARGE SUMMARY   ADMITTING DIAGNOSES:  1. Acute anteroposterior wall myocardial infarction.  2. Hypertension.  3. Tobacco abuse.  4. A history of peptic ulcer disease.  5. Degenerative joint disease.  6. A history of cocaine and marijuana abuse in the past.   FINAL DIAGNOSES:  1. Status post acute anterior wall myocardial infarction status post      percutaneous transluminal coronary angiography stenting to distal      right coronary artery.  2. Hypertension.  3. Tobacco abuse.  4. A history of peptic ulcer disease in the past.  5. Degenerative joint disease.  6. A history of cocaine and marijuana abuse.  7. Status post accelerated idioventricular rhythm.   DISCHARGE HOME MEDICATIONS:  1. Enteric-coated aspirin 325 mg 1 tablet daily.  2. Plavix 75 mg 1 tablet daily with food.  3. Toprol-XL 25 mg 1/2 tablet daily.  4. Lipitor 40 mg 1 tablet daily.  5. Nitrostat 0.4 mg sublingual, use as directed.  6. Chantix 1 mg 1/2 tablet twice daily for 3 days and then 1 tablet      twice daily.  7. Prilosec 20 mg 1 capsule daily.   DIET:  Low salt, low cholesterol.   Post PTCA stent instructions have been given.  Follow up with me in 1  week.  The patient has been advised to avoid any lifting, pushing or  pulling, or driving for 1 week.   CONDITION ON DISCHARGE:  Stable.  Follow up with me in 1 week.   BRIEF HISTORY AND HOSPITAL COURSE:  [name] is a 60 year old black male  with a past medical history significant for coronary artery disease,  hypertension, hypercholesterolemia, tobacco abuse, a history of peptic  ulcer disease, cocaine and  marijuana abuse in the past, degenerative  joint disease.  He came to the ER via EMS complaining of retrosternal  chest tightness, grade 10/10, which started around 7:30 a.m.  Associated  with nausea and numbness in both the arms.  EKG done in the ER showed  sinus bradycardia with first-degree AV block with ST elevation in II,  III, aVF, and ST depression in V1 to V3 with reciprocal ST depression in  II and aVL, suggestive of anteroposterior wall injury pattern.  Patient  denies any recent cocaine abuse but states that he still smokes  marijuana.   PAST MEDICAL HISTORY:  As above.   PAST SURGICAL HISTORY:  He had peptic ulcer repair surgery approximately  15+ years ago.   ALLERGIES:  NO KNOWN DRUG ALLERGIES.   MEDICATIONS AT HOME:  He was on:  1. Norvasc 5 mg p.o. daily.  2. Hydrochlorothiazide 25 mg p.o. daily.  3. Enteric-coated aspirin 81 mg 1 tablet daily.  SOCIAL HISTORY:  He works as a Teacher, adult education, smoked a half pack per day for  40+ years, drinks beer socially.  He occasionally smokes marijuana.  He  used to do cocaine approximately 10+ years ago.   FAMILY HISTORY:  Father died of complications of MI at the age of 41.  He also had cirrhosis of liver.  His mother died of MI at the age of 36.  One sister had MI in 30s.  One brother had MI in his 65s.   EXAMINATION:  GENERAL:  He was alert, awake, oriented x3, in no acute  distress, complaining of severe chest pain.  VITAL SIGNS:  Blood pressure was 104/60, pulse was 50, sinus  bradycardia.  HEENT:  Conjunctivae was pink.  NECK:  Supple, no JVD, no bruit.  LUNGS:  Decreased breath sounds at bases.  CARDIOVASCULAR EXAM:  S1, S2 was normal.  There was a soft S4 gallop.  ABDOMINAL:  Soft, bowel sounds are present, nontender.  EXTREMITIES:  There is no clubbing, cyanosis, or edema.   LABS:  His cholesterol was 137, LDL 67, HDL 55.  His CK was 1915, MB  156, total index 8.2.  Second set:  CK was 384, MB 11.8, total index  3.1.   Third set:  CK today is 136, MB 2.1.  Troponin-I was 95.14, 92.13,  14.73, and 6.78.  Homocysteine level was 6.9.  His sodium was 138,  potassium 2.6 which was replaced, repeat potassium was 2.9 and 4.6.  Glucose was 136.  The fasting sugar on April 22 was 94, BUN 9,  creatinine 1.01.  His hemoglobin was 13.4, hematocrit 40.4, white count  of 10.9.  Repeat hemoglobin on April 23 was 12.4, hematocrit 38.5, white  count of 8.6.   BRIEF HOSPITAL COURSE:  Patient was directly taken to the cath lab and  underwent emergency PTCA stenting to RCA.  As per procedure report,  patient tolerated procedure well, there were no complications.  The  patient was transferred to CCU in stable condition.  Post procedure, the  patient had frequent episodes of accelerated idioventricular rhythm, but  the patient remained asymptomatic with no chest pain or evidence of  hypertension or dizziness.  Accelerated idioventricular rhythm resolved  spontaneously.  Phase 1 cardiac rehab was called.  The  patient has been ambulating in the hallway without any problems.  His  groin has been stable with no evidence of hematoma or bruit.  The  patient will be discharged home on the above medications and will be  followed up in my office in 1 week.           ______________________________  Eduardo Osier. Sharyn Lull, M.D.     [facility]/MED[facility]  D:  09/07/2006  T:  09/07/2006  Job:  [id number]

## 2010-09-30 NOTE — Discharge Summary (Signed)
[place]  Patient:    [name], [name]                         MRN: [id number] Adm. Date:  [id number] Disc. Date: [id number] Attending:  Robynn Pane CC:         Osvaldo Shipper. Spruill, M.D.   Discharge Summary  ADMISSION DIAGNOSES: 1. Chest pain, rule out myocardial infarction. 2. History of cocaine abuse. 3. History of tobacco abuse. 4. History of myocardial infarction approximately five years ago. 5. Strong family history of coronary artery disease.  FINAL DIAGNOSES: 1. Chest pain, probably secondary to cocaine abuse. 2. History of cocaine abuse. 3. History of tobacco abuse. 4. Questionable history of myocardial infarction. 5. Strong family history of coronary artery disease.  DISCHARGE HOME MEDICATIONS: 1. Norvasc 2.5 mg 1 tablet daily. 2. Enteric-coated aspirin 325 mg p.o. q.d.  ACTIVITY:  As tolerated.  DIET:  Low salt, low cholesterol.  FOLLOWUP:  With me next week.  CONDITION AT DISCHARGE:  Stable.  BRIEF HISTORY AND HOSPITAL COURSE:   [name] is a 60 year old black male with past medical history significant for MI approximately five years ago, history of peptic ulcer disease, history of tobacco abuse, cocaine abuse in the past, strong family history of coronary artery disease.  He came to the ER via EMS complaining of retrosternal and precordial chest pain radiating to the left arm associated with diaphoresis, bilateral grade 9/10.   He received 2 sublingual nitroglycerin and 4 baby aspirin with partial relief of chest pain.  EKG done in the ER showed normal sinus rhythm with ST elevation in V1 to V3 and P wave inversion in anterolateral leads and LVH.  The patient denies any cardiac workup for prior MI, denies exertional chest pain in the recent past.  He states he was using marijuana during July 4 long weekend.  He used to do cocaine, and last abuse was approximately one year ago.  He denies recent cocaine abuse due to  typical angina pain, strong family history, and multiple risk factors.  EKG changes suspicious for MI.  The patient was advised of left catheterization and possible PTCA.  PAST MEDICAL HISTORY:  As above.  PAST SURGICAL HISTORY:  He had peptic ulcer repair approximately 10 years ago.  MEDICATIONS:  None.  ALLERGIES:  No known drug allergies.  SOCIAL HISTORY:  He works as Market researcher.  He smoked three-fourth pack per day for 11 years, drinks alcohol socially.  Used cocaine in the past, last use approximately one year ago.  He smoked marijuana approximately one week ago.  FAMILY HISTORY:  Father died of MI at the age of 83.  His mother died of MI at the age of 9.  One sister died of MI at the age of 18.  PHYSICAL EXAMINATION:  GENERAL:  Alert, awake, and oriented x 3 complaining of severe retrosternal chest pressure.  HEENT:  Conjunctivae pink.  NECK:  Supple.  No JVD, no bruits.  LUNGS:  Clear to auscultation without rhonchi or rales.  CARDIOVASCULAR:  S1, S2 normal.  There was no S3 or S4 gallop.  ABDOMEN:  Soft.   Bowel sounds present.  Nontender.  EXTREMITIES:  No clubbing, cyanosis, or edema.  LABORATORY DATA:  Cholesterol 158, HDL 69, LDL 76.  Two sets of CPKs were negative.  Two sets of troponin I were negative.  Hemoglobin 11.7, hematocrit 35.7, white count 7.0.  Sodium 134, potassium  4.4, chloride 104, bicarb 25, BUN 20, creatinine 1.1.  Chest x-ray: Normal.  EKG showed marked sinus bradycardia, left atrial enlargement, ST elevation in anterior leads with LVH and early repolarization.  BRIEF HOSPITAL COURSE:  Due to suspicious EKG and typical anginal pain responding to nitroglycerin and multiple risk factors, the patient was advised of left catheterization and possible angioplasty.  The patient underwent left cardiac catheterization on November 23, 1999, as per catheterization report without any complications.  The patient tolerated the procedure well.  The patient  did not have any further episodes of chest pain during the hospital stay.  The patient was discharged home by Dr. Shana Chute on July 12 and as advised to follow up with me in one week.  The patient also was advised to refrain from using cocaine. DD:  12/28/99 TD:  12/29/99 Job: [id number]

## 2010-09-30 NOTE — Cardiovascular Report (Signed)
[place]. [facility]  Patient:    [name], [name]                         MRN: [id number] Proc. Date: 11/23/99 Adm. Date:  [id number] Disc. Date: [id number] Attending:  Robynn Pane                        Cardiac Catheterization  STATIC DD:  11/23/99 TD:  11/24/99 Job: [id number]

## 2010-11-21 ENCOUNTER — Emergency Department (HOSPITAL_COMMUNITY)
Admission: EM | Admit: 2010-11-21 | Discharge: 2010-11-21 | Disposition: A | Discharge status: Home / Self Care | Payer: 59 | Attending: Emergency Medicine | Admitting: Emergency Medicine

## 2010-11-21 ENCOUNTER — Emergency Department (HOSPITAL_COMMUNITY): Payer: 59

## 2010-11-21 DIAGNOSIS — R109 Unspecified abdominal pain: Secondary | ICD-10-CM | POA: Insufficient documentation

## 2010-11-21 DIAGNOSIS — R079 Chest pain, unspecified: Secondary | ICD-10-CM | POA: Insufficient documentation

## 2010-11-21 DIAGNOSIS — R5381 Other malaise: Secondary | ICD-10-CM | POA: Insufficient documentation

## 2010-11-21 DIAGNOSIS — R9431 Abnormal electrocardiogram [ECG] [EKG]: Secondary | ICD-10-CM | POA: Insufficient documentation

## 2010-11-21 DIAGNOSIS — I252 Old myocardial infarction: Secondary | ICD-10-CM | POA: Insufficient documentation

## 2010-11-21 DIAGNOSIS — R110 Nausea: Secondary | ICD-10-CM | POA: Insufficient documentation

## 2010-11-21 LAB — COMPREHENSIVE METABOLIC PANEL
ALT: 11 U/L (ref 0–53)
AST: 19 U/L (ref 0–37)
CO2: 30 mEq/L (ref 19–32)
Chloride: 101 mEq/L (ref 96–112)
Creatinine, Ser: 1.09 mg/dL (ref 0.50–1.35)
GFR calc Af Amer: >60 mL/min (ref >60)
GFR calc non Af Amer: >60 mL/min (ref >60)
Glucose, Bld: 89 mg/dL (ref 70–99)
Total Bilirubin: 0.5 mg/dL (ref 0.3–1.2)

## 2010-11-21 LAB — DIFFERENTIAL
Basophils Absolute: 0.1 10*3/uL (ref 0.0–0.1)
Eosinophils Absolute: 0.7 10*3/uL (ref 0.0–0.7)
Eosinophils Relative: 8 % — ABNORMAL HIGH (ref 0–5)
Lymphs Abs: 2.9 10*3/uL (ref 0.7–4.0)
Monocytes Absolute: 0.7 10*3/uL (ref 0.1–1.0)
Neutrophils Relative %: 48 % (ref 43–77)

## 2010-11-21 LAB — URINALYSIS, ROUTINE W REFLEX MICROSCOPIC
Glucose, UA: NEGATIVE mg/dL
Ketones, ur: NEGATIVE mg/dL
Leukocytes, UA: NEGATIVE
Protein, ur: NEGATIVE mg/dL
Urobilinogen, UA: 0.2 mg/dL (ref 0.0–1.0)

## 2010-11-21 LAB — CBC
Hemoglobin: 14.8 g/dL (ref 13.0–17.0)
MCH: 23.1 pg — ABNORMAL LOW (ref 26.0–34.0)
MCHC: 32 g/dL (ref 30.0–36.0)
Platelets: 314 10*3/uL (ref 150–400)
RDW: 14.7 % (ref 11.5–15.5)

## 2010-11-21 LAB — TROPONIN I: Troponin I: <0.3 ng/mL (ref <0.30)

## 2010-11-21 LAB — CK TOTAL AND CKMB (NOT AT ARMC): Total CK: 138 U/L (ref 7–232)

## 2010-11-25 ENCOUNTER — Other Ambulatory Visit (HOSPITAL_COMMUNITY): Payer: Self-pay | Admitting: Cardiology

## 2010-12-16 ENCOUNTER — Other Ambulatory Visit (HOSPITAL_COMMUNITY): Payer: 59

## 2011-09-28 ENCOUNTER — Encounter (HOSPITAL_COMMUNITY): Payer: Self-pay | Admitting: Emergency Medicine

## 2011-09-28 ENCOUNTER — Emergency Department (HOSPITAL_COMMUNITY): Payer: BC Managed Care – PPO

## 2011-09-28 ENCOUNTER — Inpatient Hospital Stay (HOSPITAL_COMMUNITY)
Admission: EM | Admit: 2011-09-28 | Discharge: 2011-09-30 | DRG: 125 | Disposition: A | Discharge status: Home / Self Care | Payer: BC Managed Care – PPO | Attending: Cardiology | Admitting: Cardiology

## 2011-09-28 DIAGNOSIS — Z8711 Personal history of peptic ulcer disease: Secondary | ICD-10-CM

## 2011-09-28 DIAGNOSIS — Z91199 Patient's noncompliance with other medical treatment and regimen due to unspecified reason: Secondary | ICD-10-CM

## 2011-09-28 DIAGNOSIS — I498 Other specified cardiac arrhythmias: Secondary | ICD-10-CM | POA: Diagnosis present

## 2011-09-28 DIAGNOSIS — I252 Old myocardial infarction: Secondary | ICD-10-CM

## 2011-09-28 DIAGNOSIS — F17200 Nicotine dependence, unspecified, uncomplicated: Secondary | ICD-10-CM | POA: Diagnosis present

## 2011-09-28 DIAGNOSIS — Z9119 Patient's noncompliance with other medical treatment and regimen: Secondary | ICD-10-CM

## 2011-09-28 DIAGNOSIS — I219 Acute myocardial infarction, unspecified: Secondary | ICD-10-CM | POA: Insufficient documentation

## 2011-09-28 DIAGNOSIS — R531 Weakness: Secondary | ICD-10-CM

## 2011-09-28 DIAGNOSIS — I2510 Atherosclerotic heart disease of native coronary artery without angina pectoris: Principal | ICD-10-CM | POA: Diagnosis present

## 2011-09-28 DIAGNOSIS — R001 Bradycardia, unspecified: Secondary | ICD-10-CM

## 2011-09-28 DIAGNOSIS — M1990 Unspecified osteoarthritis, unspecified site: Secondary | ICD-10-CM | POA: Diagnosis present

## 2011-09-28 DIAGNOSIS — Z9861 Coronary angioplasty status: Secondary | ICD-10-CM

## 2011-09-28 DIAGNOSIS — I249 Acute ischemic heart disease, unspecified: Secondary | ICD-10-CM

## 2011-09-28 DIAGNOSIS — Z8249 Family history of ischemic heart disease and other diseases of the circulatory system: Secondary | ICD-10-CM

## 2011-09-28 DIAGNOSIS — I10 Essential (primary) hypertension: Secondary | ICD-10-CM | POA: Diagnosis present

## 2011-09-28 DIAGNOSIS — Y849 Medical procedure, unspecified as the cause of abnormal reaction of the patient, or of later complication, without mention of misadventure at the time of the procedure: Secondary | ICD-10-CM | POA: Diagnosis present

## 2011-09-28 DIAGNOSIS — E7800 Pure hypercholesterolemia, unspecified: Secondary | ICD-10-CM | POA: Diagnosis present

## 2011-09-28 DIAGNOSIS — T82897A Other specified complication of cardiac prosthetic devices, implants and grafts, initial encounter: Secondary | ICD-10-CM | POA: Diagnosis present

## 2011-09-28 HISTORY — DX: Unspecified osteoarthritis, unspecified site: M19.90

## 2011-09-28 HISTORY — DX: Acute myocardial infarction, unspecified: I21.9

## 2011-09-28 HISTORY — DX: Bradycardia, unspecified: R00.1

## 2011-09-28 HISTORY — DX: Supraventricular tachycardia: I47.1

## 2011-09-28 HISTORY — DX: Pure hypercholesterolemia, unspecified: E78.00

## 2011-09-28 HISTORY — DX: Essential (primary) hypertension: I10

## 2011-09-28 HISTORY — DX: Peptic ulcer, site unspecified, unspecified as acute or chronic, without hemorrhage or perforation: K27.9

## 2011-09-28 HISTORY — DX: Low back pain: M54.5

## 2011-09-28 HISTORY — DX: Low back pain, unspecified: M54.50

## 2011-09-28 HISTORY — DX: Angina pectoris, unspecified: I20.9

## 2011-09-28 HISTORY — DX: Atherosclerotic heart disease of native coronary artery without angina pectoris: I25.10

## 2011-09-28 HISTORY — DX: Other chronic pain: G89.29

## 2011-09-28 HISTORY — DX: Supraventricular tachycardia, unspecified: I47.10

## 2011-09-28 HISTORY — DX: Pneumonia, unspecified organism: J18.9

## 2011-09-28 HISTORY — DX: ST elevation (STEMI) myocardial infarction involving other coronary artery of inferior wall: I21.19

## 2011-09-28 LAB — DIFFERENTIAL
Basophils Relative: 1 % (ref 0–1)
Eosinophils Absolute: 0.5 10*3/uL (ref 0.0–0.7)
Eosinophils Relative: 4 % (ref 0–5)
Eosinophils Relative: 6 % — ABNORMAL HIGH (ref 0–5)
Lymphs Abs: 2.3 10*3/uL (ref 0.7–4.0)
Lymphs Abs: 3.4 10*3/uL (ref 0.7–4.0)
Monocytes Absolute: 0.6 10*3/uL (ref 0.1–1.0)
Monocytes Absolute: 0.9 10*3/uL (ref 0.1–1.0)
Monocytes Relative: 10 % (ref 3–12)
Neutro Abs: 3 10*3/uL (ref 1.7–7.7)
Neutrophils Relative %: 41 % — ABNORMAL LOW (ref 43–77)

## 2011-09-28 LAB — COMPREHENSIVE METABOLIC PANEL
BUN: 10 mg/dL (ref 6–23)
BUN: 11 mg/dL (ref 6–23)
CO2: 24 mEq/L (ref 19–32)
CO2: 31 mEq/L (ref 19–32)
Calcium: 9.2 mg/dL (ref 8.4–10.5)
Calcium: 9.8 mg/dL (ref 8.4–10.5)
Chloride: 104 mEq/L (ref 96–112)
Chloride: 101 mEq/L (ref 96–112)
Creatinine, Ser: 0.94 mg/dL (ref 0.50–1.35)
Creatinine, Ser: 1.11 mg/dL (ref 0.50–1.35)
GFR calc Af Amer: >90 mL/min (ref >90)
GFR calc Af Amer: 82 mL/min — ABNORMAL LOW (ref >90)
GFR calc non Af Amer: 90 mL/min — ABNORMAL LOW (ref >90)
GFR calc non Af Amer: 71 mL/min — ABNORMAL LOW (ref >90)
Total Bilirubin: 0.5 mg/dL (ref 0.3–1.2)
Total Bilirubin: 0.6 mg/dL (ref 0.3–1.2)

## 2011-09-28 LAB — CBC
HCT: 41.4 % (ref 39.0–52.0)
Hemoglobin: 15.9 g/dL (ref 13.0–17.0)
MCH: 23.8 pg — ABNORMAL LOW (ref 26.0–34.0)
MCH: 23.8 pg — ABNORMAL LOW (ref 26.0–34.0)
MCHC: 33.5 g/dL (ref 30.0–36.0)
MCV: 70.8 fL — ABNORMAL LOW (ref 78.0–100.0)
MCV: 71.2 fL — ABNORMAL LOW (ref 78.0–100.0)
Platelets: 296 10*3/uL (ref 150–400)
Platelets: 346 10*3/uL (ref 150–400)
RBC: 5.85 MIL/uL — ABNORMAL HIGH (ref 4.22–5.81)
RBC: 6.67 MIL/uL — ABNORMAL HIGH (ref 4.22–5.81)
RDW: 14.7 % (ref 11.5–15.5)
WBC: 6.3 10*3/uL (ref 4.0–10.5)

## 2011-09-28 LAB — MAGNESIUM: Magnesium: 2.3 mg/dL (ref 1.5–2.5)

## 2011-09-28 LAB — PROTIME-INR
INR: 1.12 (ref 0.00–1.49)
INR: 1.09 (ref 0.00–1.49)
Prothrombin Time: 14.6 seconds (ref 11.6–15.2)
Prothrombin Time: 14.3 seconds (ref 11.6–15.2)

## 2011-09-28 LAB — TROPONIN I: Troponin I: <0.3 ng/mL (ref <0.30)

## 2011-09-28 LAB — CARDIAC PANEL(CRET KIN+CKTOT+MB+TROPI)
CK, MB: 5 ng/mL — ABNORMAL HIGH (ref 0.3–4.0)
CK, MB: 4 ng/mL (ref 0.3–4.0)
Relative Index: 2.9 — ABNORMAL HIGH (ref 0.0–2.5)
Total CK: 138 U/L (ref 7–232)
Troponin I: <0.3 ng/mL (ref <0.30)
Troponin I: <0.3 ng/mL (ref <0.30)

## 2011-09-28 MED ORDER — ASPIRIN 81 MG PO CHEW: 324.0000 mg | CHEWABLE_TABLET | ORAL | Status: AC

## 2011-09-28 MED ORDER — CLOPIDOGREL BISULFATE 75 MG PO TABS
75.0000 mg | ORAL_TABLET | Freq: Every day | ORAL | Status: DC
  Administered 2011-09-29 – 2011-09-30 (×2): 75 mg via ORAL
  Filled 2011-09-28 (×2): qty 1

## 2011-09-28 MED ORDER — FAMOTIDINE 20 MG PO TABS
20.0000 mg | ORAL_TABLET | Freq: Two times a day (BID) | ORAL | Status: DC
  Administered 2011-09-28 – 2011-09-29 (×3): 20 mg via ORAL
  Filled 2011-09-28 (×6): qty 1

## 2011-09-28 MED ORDER — CLOPIDOGREL BISULFATE 75 MG PO TABS
300.0000 mg | ORAL_TABLET | Freq: Once | ORAL | Status: AC
  Administered 2011-09-28: 300 mg via ORAL
  Filled 2011-09-28: qty 4

## 2011-09-28 MED ORDER — NITROGLYCERIN 0.4 MG SL SUBL: 0.4000 mg | SUBLINGUAL_TABLET | SUBLINGUAL | Status: DC | PRN

## 2011-09-28 MED ORDER — AMLODIPINE BESYLATE 5 MG PO TABS
5.0000 mg | ORAL_TABLET | Freq: Every day | ORAL | Status: DC
  Administered 2011-09-28 – 2011-09-30 (×3): 5 mg via ORAL
  Filled 2011-09-28 (×4): qty 1

## 2011-09-28 MED ORDER — ALPRAZOLAM 0.25 MG PO TABS: 0.2500 mg | ORAL_TABLET | Freq: Two times a day (BID) | ORAL | Status: DC | PRN

## 2011-09-28 MED ORDER — NITROGLYCERIN IN D5W 200-5 MCG/ML-% IV SOLN
5.0000 ug/min | INTRAVENOUS | Status: DC
  Administered 2011-09-28: 5 ug/min via INTRAVENOUS
  Filled 2011-09-28: qty 250

## 2011-09-28 MED ORDER — ASPIRIN 300 MG RE SUPP
300.0000 mg | RECTAL | Status: AC
  Filled 2011-09-28: qty 1

## 2011-09-28 MED ORDER — ATORVASTATIN CALCIUM 80 MG PO TABS
80.0000 mg | ORAL_TABLET | Freq: Every day | ORAL | Status: DC
  Administered 2011-09-30: 80 mg via ORAL
  Filled 2011-09-28 (×3): qty 1

## 2011-09-28 MED ORDER — ONDANSETRON HCL 4 MG/2ML IJ SOLN: 4.0000 mg | Freq: Four times a day (QID) | INTRAMUSCULAR | Status: DC | PRN

## 2011-09-28 MED ORDER — HEPARIN (PORCINE) IN NACL 100-0.45 UNIT/ML-% IJ SOLN
800.0000 [IU]/h | INTRAMUSCULAR | Status: AC
  Administered 2011-09-28: 800 [IU]/h via INTRAVENOUS
  Filled 2011-09-28: qty 250

## 2011-09-28 MED ORDER — HEPARIN BOLUS VIA INFUSION
4000.0000 [IU] | Freq: Once | INTRAVENOUS | Status: AC
  Administered 2011-09-28: 4000 [IU] via INTRAVENOUS

## 2011-09-28 MED ORDER — ASPIRIN EC 81 MG PO TBEC
81.0000 mg | DELAYED_RELEASE_TABLET | Freq: Every day | ORAL | Status: DC
  Administered 2011-09-29 – 2011-09-30 (×2): 81 mg via ORAL
  Filled 2011-09-28 (×2): qty 1

## 2011-09-28 MED ORDER — MORPHINE SULFATE 4 MG/ML IJ SOLN
4.0000 mg | INTRAMUSCULAR | Status: AC
  Administered 2011-09-28: 4 mg via INTRAVENOUS
  Filled 2011-09-28: qty 1

## 2011-09-28 MED ORDER — HEPARIN (PORCINE) IN NACL 100-0.45 UNIT/ML-% IJ SOLN
800.0000 [IU]/h | INTRAMUSCULAR | Status: DC
  Filled 2011-09-28 (×2): qty 250

## 2011-09-28 MED ORDER — NITROGLYCERIN 0.3 MG SL SUBL
0.3000 mg | SUBLINGUAL_TABLET | SUBLINGUAL | Status: DC | PRN
  Administered 2011-09-28: 0.3 mg via SUBLINGUAL

## 2011-09-28 MED ORDER — ACETAMINOPHEN 325 MG PO TABS: 650.0000 mg | ORAL_TABLET | ORAL | Status: DC | PRN

## 2011-09-28 MED ORDER — NITROGLYCERIN 0.4 MG SL SUBL
0.4000 mg | SUBLINGUAL_TABLET | SUBLINGUAL | Status: DC | PRN
Start: 2011-09-28 — End: 2011-09-30

## 2011-09-28 NOTE — ED Notes (Signed)
[id number] Ready

## 2011-09-28 NOTE — H&P (Signed)
[name] is an 61 y.o. male.   Chief Complaint: Chest pain HPI: Patient is 61 year old male with past medical history significant for coronary artery disease history of inferior wall myocardial infarction in April of 2008 requiring primary PTCA stenting to distal RCA, hypertension, hypercholesteremia, history of supraventricular tachycardia, tobacco abuse, history of cocaine and marijuana abuse, history of peptic ulcer disease, and history of degenerative joint disease, history of positive family history of coronary artery disease came to the ER by EMS complaining of sudden onset of retrosternal and right-sided chest pain associated with feeling weak and dizzy and right arm numbness and no energy while at work. Patient describes chest pain as tightness grade 5-6/10 associated with mild shortness of breath and palpitation. Denies any lightheadedness or syncope patient received sublingual nitroglycerin and morphine sulfate with relief of chest pain. Patient noncompliance to medication and followup and has not been seen in my office in last a approximately one year.  Past Medical History  Diagnosis Date  . Coronary artery disease   . Hypertension   . MI (myocardial infarction)     History reviewed. No pertinent past surgical history.  History reviewed. No pertinent family history. Social History:  does not have a smoking history on file. He does not have any smokeless tobacco history on file. His alcohol and drug histories not on file.  Allergies: No Known Allergies   (Not in a hospital admission)  Results for orders placed during the hospital encounter of 09/28/11 (from the past 48 hour(s))  CBC     Status: Abnormal   Collection Time   09/28/11 10:13 AM      Component Value Range Comment   WBC 6.3  4.0 - 10.5 (K/uL)    RBC 5.85 (*) 4.22 - 5.81 (MIL/uL)    Hemoglobin 13.9  13.0 - 17.0 (g/dL)    HCT 91.4  78.2 - 95.6 (%)    MCV 70.8 (*) 78.0 - 100.0 (fL)    MCH 23.8 (*) 26.0 - 34.0 (pg)     MCHC 33.6  30.0 - 36.0 (g/dL)    RDW 21.3  08.6 - 57.8 (%)    Platelets 296  150 - 400 (K/uL)   DIFFERENTIAL     Status: Normal   Collection Time   09/28/11 10:13 AM      Component Value Range Comment   Neutrophils Relative 49  43 - 77 (%)    Lymphocytes Relative 36  12 - 46 (%)    Monocytes Relative 10  3 - 12 (%)    Eosinophils Relative 4  0 - 5 (%)    Basophils Relative 1  0 - 1 (%)    Neutro Abs 3.0  1.7 - 7.7 (K/uL)    Lymphs Abs 2.3  0.7 - 4.0 (K/uL)    Monocytes Absolute 0.6  0.1 - 1.0 (K/uL)    Eosinophils Absolute 0.3  0.0 - 0.7 (K/uL)    Basophils Absolute 0.1  0.0 - 0.1 (K/uL)    RBC Morphology TARGET CELLS     COMPREHENSIVE METABOLIC PANEL     Status: Abnormal   Collection Time   09/28/11 10:13 AM      Component Value Range Comment   Sodium 139  135 - 145 (mEq/L)    Potassium 4.2  3.5 - 5.1 (mEq/L)    Chloride 104  96 - 112 (mEq/L)    CO2 24  19 - 32 (mEq/L)    Glucose, Bld 90  70 - 99 (  mg/dL)    BUN 10  6 - 23 (mg/dL)    Creatinine, Ser 4.78  0.50 - 1.35 (mg/dL)    Calcium 9.2  8.4 - 10.5 (mg/dL)    Total Protein 6.7  6.0 - 8.3 (g/dL)    Albumin 3.7  3.5 - 5.2 (g/dL)    AST 18  0 - 37 (U/L)    ALT 10  0 - 53 (U/L)    Alkaline Phosphatase 47  39 - 117 (U/L)    Total Bilirubin 0.5  0.3 - 1.2 (mg/dL)    GFR calc non Af Amer 90 (*) >90 (mL/min)    GFR calc Af Amer >90  >90 (mL/min)   PROTIME-INR     Status: Normal   Collection Time   09/28/11 10:13 AM      Component Value Range Comment   Prothrombin Time 14.6  11.6 - 15.2 (seconds)    INR 1.12  0.00 - 1.49    TROPONIN I     Status: Normal   Collection Time   09/28/11 10:14 AM      Component Value Range Comment   Troponin I <0.30  <0.30 (ng/mL)    Ct Head Wo Contrast  09/28/2011  *RADIOLOGY REPORT*  Clinical Data: Right-sided weakness.  Dizziness.  CT HEAD WITHOUT CONTRAST  Technique:  Contiguous axial images were obtained from the base of the skull through the vertex without contrast.  Comparison: MRI  07/04/2005  Findings: No acute intracranial abnormality.  Specifically, no hemorrhage, hydrocephalus, mass lesion, acute infarction, or significant intracranial injury.  No acute calvarial abnormality. Visualized paranasal sinuses and mastoids clear.  Orbital soft tissues unremarkable.  IMPRESSION: No acute intracranial abnormality.  Original Report Authenticated By: Cyndie Chime, M.D.   Dg Chest Port 1 View  09/28/2011  *RADIOLOGY REPORT*  Clinical Data: Chest pain, right-sided numbness.  PORTABLE CHEST - 1 VIEW  Comparison: 12/01/2010  Findings: Hyperinflation of the lungs, stable.  No confluent airspace opacities visualized.  The left base is incompletely imaged.  Heart is normal size.  No visible effusions.  IMPRESSION: Hyperinflation.  No visible acute findings.  Left base incompletely imaged.  Original Report Authenticated By: Cyndie Chime, M.D.    Review of Systems  Constitutional: Negative for fever, chills, weight loss and malaise/fatigue.  Eyes: Negative for blurred vision, double vision and photophobia.  Respiratory: Positive for shortness of breath. Negative for cough, hemoptysis, sputum production and wheezing.   Cardiovascular: Positive for chest pain and palpitations. Negative for orthopnea, claudication, leg swelling and PND.  Gastrointestinal: Positive for nausea. Negative for vomiting and abdominal pain.  Neurological: Positive for dizziness. Negative for headaches.    Blood pressure 134/77, pulse 49, temperature 97.7 F (36.5 C), temperature source Oral, resp. rate 11, SpO2 100.00%. Physical Exam  Constitutional: He is oriented to person, place, and time. He appears well-developed and well-nourished.  HENT:  Head: Normocephalic and atraumatic.  Eyes: Conjunctivae are normal. Pupils are equal, round, and reactive to light. Left eye exhibits no discharge. No scleral icterus.  Neck: Normal range of motion. Neck supple. No JVD present. No tracheal deviation present. No  thyromegaly present.  Cardiovascular: Normal rate and regular rhythm.  Exam reveals no friction rub.   Murmur (Soft systolic murmur noted no S3 gallop) heard. Respiratory: Breath sounds normal. No respiratory distress. He has no wheezes. He has no rales.  GI: Soft. Bowel sounds are normal. He exhibits no distension and no mass. There is no tenderness. There is no  rebound and no guarding.  Musculoskeletal: Normal range of motion. He exhibits no edema and no tenderness.  Lymphadenopathy:    He has no cervical adenopathy.  Neurological: He is alert and oriented to person, place, and time.     Assessment/Plan Acute coronary syndrome rule out MI Coronary artery disease history of inferior wall myocardial infarction in the past status post PTCA stenting to distal RCA in the past Hypertension Hypercholesteremia History of SVT Tobacco abuse History of peptic ulcer disease History of cocaine and marijuana abuse Degenerative joint disease Positive family history of coronary artery disease Marked sinus bradycardia Plan As per orders Check old records Schedule for nuclear stress test in a.m. Will hold off beta blockers in view of marked sinus bradycardia  [name] N 09/28/2011, 12:17 PM

## 2011-09-28 NOTE — ED Provider Notes (Signed)
History     CSN: [id number]  Arrival date & time 09/28/11  0934   None     Chief Complaint  Patient presents with  . Chest Pain  . Tachycardia    (Consider location/radiation/quality/duration/timing/severity/associated sxs/prior treatment) Patient is a 61 y.o. male presenting with chest pain. The history is provided by the patient.  Chest Pain The chest pain began 1 - 2 hours ago. Chest pain occurs constantly. The chest pain is improving. Associated with: nothing. The pain is currently at 3/10. The severity of the pain is moderate. The quality of the pain is described as tightness. The pain does not radiate. Exacerbated by: nothing. Primary symptoms include shortness of breath (mild), palpitations and nausea. Pertinent negatives for primary symptoms include no fever, no cough, no abdominal pain and no vomiting.  The palpitations also occurred with shortness of breath (mild).  Associated symptoms include diaphoresis.  Pertinent negatives for associated symptoms include no numbness. Associated symptoms comments: Right sided weakness and numbness . Risk factors: hx of MI.     Past Medical History  Diagnosis Date  . Coronary artery disease   . Hypertension   . MI (myocardial infarction)     History reviewed. No pertinent past surgical history.  History reviewed. No pertinent family history.  History  Substance Use Topics  . Smoking status: Not on file  . Smokeless tobacco: Not on file  . Alcohol Use:       Review of Systems  Constitutional: Positive for diaphoresis. Negative for fever.  HENT: Negative for rhinorrhea, drooling and neck pain.   Eyes: Negative for pain.  Respiratory: Positive for shortness of breath (mild). Negative for cough.   Cardiovascular: Positive for chest pain and palpitations. Negative for leg swelling.  Gastrointestinal: Positive for nausea. Negative for vomiting, abdominal pain and diarrhea.  Genitourinary: Negative for dysuria and hematuria.    Musculoskeletal: Negative for gait problem.  Skin: Negative for color change.  Neurological: Negative for numbness and headaches.  Hematological: Negative for adenopathy.  Psychiatric/Behavioral: Negative for behavioral problems.  All other systems reviewed and are negative.    Allergies  Review of patient's allergies indicates no known allergies.  Home Medications   Current Outpatient Rx  Name Route Sig Dispense Refill  . ASPIRIN 81 MG PO CHEW Oral Chew 81 mg by mouth daily.      BP 129/84  Pulse 95  Temp(Src) 97.7 F (36.5 C) (Oral)  Resp 17  SpO2 96%  Physical Exam  Constitutional: He is oriented to person, place, and time. He appears well-developed and well-nourished.  HENT:  Head: Normocephalic and atraumatic.  Right Ear: External ear normal.  Left Ear: External ear normal.  Nose: Nose normal.  Mouth/Throat: Oropharynx is clear and moist. No oropharyngeal exudate.  Eyes: Conjunctivae and EOM are normal. Pupils are equal, round, and reactive to light.  Neck: Normal range of motion. Neck supple.  Cardiovascular: Normal rate, regular rhythm, normal heart sounds and intact distal pulses.  Exam reveals no gallop and no friction rub.   No murmur heard. Pulmonary/Chest: Effort normal and breath sounds normal. No respiratory distress. He has no wheezes.  Abdominal: Soft. Bowel sounds are normal. He exhibits no distension. There is no tenderness.  Musculoskeletal: Normal range of motion. He exhibits no edema and no tenderness.  Neurological: He is alert and oriented to person, place, and time. He has normal strength. He displays no tremor. No cranial nerve deficit or sensory deficit. He displays a negative Romberg sign. Coordination  and gait normal.       Mild altered sensation in RLE.   Skin: Skin is warm and dry.  Psychiatric: He has a normal mood and affect. His behavior is normal.    ED Course  Procedures (including critical care time)  Labs Reviewed - No data to  display No results found.   No diagnosis found.   Date: 09/28/2011  Rate: 89  Rhythm: normal sinus rhythm  QRS Axis: normal  Intervals: normal  ST/T Wave abnormalities: anterior ST elevation, probably due to LVH  Conduction Disutrbances:none  Narrative Interpretation: Inferior q wave. No new significant changes from previous.  Old EKG Reviewed: unchanged    MDM  10:03 AM 61 y.o. male w hx of MI s/p stent x2 on asa and plavix pw right sided weakness and chest tightness that began at approx 9am this morning while standing at work. Pt got 5 sl nitro, 8mg  morphine, 325mg  asa en route. Now has 3/10 cp. Pt AFVSS here, appears well on exam, has mild numbness in RLE, otherwise neurologically intact. Will plan for cp r/o and will get CT Head to r/o ischemia.    12:32 PM Discussed case w/ pt's cardiologist, Dr. Sharyn Lull. Will admit for cp r/o.  Clinical Impression 1. Chest pain   2. Right sided weakness         Purvis Sheffield, MD 09/28/11 [phone]

## 2011-09-28 NOTE — Progress Notes (Signed)
UR Completed Shanta Hartner Graves-Bigelow, RN,BSN [phone]  

## 2011-09-28 NOTE — ED Notes (Signed)
Per EMS: pt from work c/o midsternal chest pressure with generalized weakness, diaphoretic and tachycardia; pt sts feels same as previous MI; pt given 8mg  morphine, 5 SL nitro and 324mg  ASA with some relief

## 2011-09-28 NOTE — ED Notes (Signed)
3711-01 Ready 

## 2011-09-28 NOTE — Plan of Care (Signed)
Problem: Phase II Progression Outcomes Goal: Stress Test if indicated Outcome: Progressing Scheduled for 5/17.

## 2011-09-28 NOTE — Progress Notes (Signed)
ANTICOAGULATION CONSULT NOTE - Initial Consult  Pharmacy Consult for heparin Indication: chest pain/ACS  No Known Allergies  Patient Measurements: Height: 6' 0.83" (185 cm) Weight: 146 lb 6.2 oz (66.4 kg) IBW/kg (Calculated) : 79.52  Heparin Dosing Weight: 66 kg   Vital Signs: Temp: 97.7 F (36.5 C) (05/16 0940) Temp src: Oral (05/16 0940) BP: 140/80 mmHg (05/16 1308) Pulse Rate: 46  (05/16 1308)  Labs:  Basename 09/28/11 1014 09/28/11 1013  HGB -- 13.9  HCT -- 41.4  PLT -- 296  APTT -- --  LABPROT -- 14.6  INR -- 1.12  HEPARINUNFRC -- --  CREATININE -- 0.94  CKTOTAL -- --  CKMB -- --  TROPONINI <0.30 --    Estimated Creatinine Clearance: 79.5 ml/min (by C-G formula based on Cr of 0.94).   Medical History: Past Medical History  Diagnosis Date  . Coronary artery disease   . Hypertension   . MI (myocardial infarction)     Medications:  Scheduled:    .  morphine injection  4 mg Intravenous STAT   Infusions:    Assessment: 61 yo male with ACS will be put on heparin therpay. H/H 13.9/41.4; Plt 296; INR 1.12. Not on prior anticoag. Goal of Therapy:  Heparin level 0.3-0.7 units/ml Monitor platelets by anticoagulation protocol: Yes   Plan:  1) Heparin 4000 units iv bolus x1, then start heparin drip at 800 units/hr (= 10ml/hr) 2) Check an 6 hour heparin level after drip is started. 3) Daily heparin level and CBC.  Karagan Lehr, Tsz-Yin 09/28/2011,1:19 PM

## 2011-09-28 NOTE — ED Notes (Signed)
Attempt to call report to [phone] again

## 2011-09-28 NOTE — Care Management Note (Unsigned)
    Page 1 of 1   09/28/2011     5:09:10 PM   CARE MANAGEMENT NOTE 09/28/2011  Patient:  [name], [name]   Account Number:  [id number]  Date Initiated:  09/28/2011  Documentation initiated by:  GRAVES-BIGELOW,Shantana Christon  Subjective/Objective Assessment:   Pt admitted with cp. Plan for stress test in am.     Action/Plan:   CM will continue to monitor for disposition needs.   Anticipated DC Date:  09/29/2011   Anticipated DC Plan:  HOME/SELF CARE         Choice offered to / List presented to:             Status of service:  In process, will continue to follow Medicare Important Message given?   (If response is "NO", the following Medicare IM given date fields will be blank) Date Medicare IM given:   Date Additional Medicare IM given:    Discharge Disposition:    Per UR Regulation:    If discussed at Long Length of Stay Meetings, dates discussed:    Comments:

## 2011-09-28 NOTE — ED Notes (Signed)
Attempted to call report to floor; floor unable to accept to call back 

## 2011-09-28 NOTE — Progress Notes (Signed)
ANTICOAGULATION CONSULT NOTE - Initial Consult  Pharmacy Consult for heparin Indication: chest pain/ACS  No Known Allergies  Patient Measurements: Height: 6' 0.83" (185 cm) Weight: 146 lb 6.2 oz (66.4 kg) IBW/kg (Calculated) : 79.52  Heparin Dosing Weight: 66 kg   Vital Signs: Temp: 97.5 F (36.4 C) (05/16 1555) Temp src: Oral (05/16 1555) BP: 150/75 mmHg (05/16 1555) Pulse Rate: 59  (05/16 1555)  Labs:  Basename 09/28/11 1954 09/28/11 1717 09/28/11 1716 09/28/11 1014 09/28/11 1013  HGB -- 15.9 -- -- 13.9  HCT -- 47.5 -- -- 41.4  PLT -- 346 -- -- 296  APTT -- 190* -- -- --  LABPROT -- 14.3 -- -- 14.6  INR -- 1.09 -- -- 1.12  HEPARINUNFRC 0.61 -- -- -- --  CREATININE -- 1.11 -- -- 0.94  CKTOTAL -- -- 178 -- --  CKMB -- -- 5.0* -- --  TROPONINI -- -- <0.30 <0.30 --    Estimated Creatinine Clearance: 67.3 ml/min (by C-G formula based on Cr of 1.11).   Medical History: Past Medical History  Diagnosis Date  . Coronary artery disease   . Hypertension   . MI (myocardial infarction)     Medications:  Scheduled:     . amLODipine  5 mg Oral Daily  . aspirin  324 mg Oral NOW   Or  . aspirin  300 mg Rectal NOW  . aspirin EC  81 mg Oral Daily  . atorvastatin  80 mg Oral q1800  . clopidogrel  300 mg Oral Once  . clopidogrel  75 mg Oral Q breakfast  . famotidine  20 mg Oral BID  . heparin  800 Units/hr Intravenous To Major  . heparin  4,000 Units Intravenous Once  .  morphine injection  4 mg Intravenous STAT   Infusions:     . heparin    . nitroGLYCERIN 5 mcg/min (09/28/11 1911)    Assessment: 61 yo male with possible ACS started on heparin therapy,patient with known CAD (h/o MI and PCI to RCA). Troponin neg to date. Initial heparin level = 0.61  Goal of Therapy:  Heparin level 0.3-0.7 units/ml Monitor platelets by anticoagulation protocol: Yes   Plan:  1) Continue drip at 800 units/hr (= 57ml/hr) as heparin level is therapeutic. Follow-up with am heparin  level to verify therapeutic and not seeing effect from bolus.  2) Daily heparin level and CBC.  Dannielle Huh 09/28/2011,8:57 PM

## 2011-09-29 ENCOUNTER — Observation Stay (HOSPITAL_COMMUNITY): Payer: BC Managed Care – PPO

## 2011-09-29 ENCOUNTER — Encounter (HOSPITAL_COMMUNITY)
Admission: EM | Disposition: A | Discharge status: Home / Self Care | Payer: Self-pay | Source: Home / Self Care | Attending: Cardiology

## 2011-09-29 HISTORY — PX: LEFT HEART CATHETERIZATION WITH CORONARY ANGIOGRAM: SHX5451

## 2011-09-29 LAB — BASIC METABOLIC PANEL
Calcium: 8.8 mg/dL (ref 8.4–10.5)
GFR calc Af Amer: 87 mL/min — ABNORMAL LOW (ref >90)
GFR calc non Af Amer: 75 mL/min — ABNORMAL LOW (ref >90)
Glucose, Bld: 87 mg/dL (ref 70–99)
Potassium: 4.1 mEq/L (ref 3.5–5.1)
Sodium: 138 mEq/L (ref 135–145)

## 2011-09-29 LAB — LIPID PANEL
Cholesterol: 158 mg/dL (ref 0–200)
Total CHOL/HDL Ratio: 2.5 RATIO

## 2011-09-29 LAB — CARDIAC PANEL(CRET KIN+CKTOT+MB+TROPI)
CK, MB: 4.2 ng/mL — ABNORMAL HIGH (ref 0.3–4.0)
Total CK: 121 U/L (ref 7–232)
Troponin I: <0.3 ng/mL (ref <0.30)

## 2011-09-29 LAB — TSH: TSH: 1.091 u[IU]/mL (ref 0.350–4.500)

## 2011-09-29 LAB — HEPARIN LEVEL (UNFRACTIONATED): Heparin Unfractionated: 0.38 IU/mL (ref 0.30–0.70)

## 2011-09-29 LAB — POCT ACTIVATED CLOTTING TIME: Activated Clotting Time: 320 seconds

## 2011-09-29 SURGERY — LEFT HEART CATHETERIZATION WITH CORONARY ANGIOGRAM
Anesthesia: LOCAL

## 2011-09-29 MED ORDER — MIDAZOLAM HCL 2 MG/2ML IJ SOLN
INTRAMUSCULAR | Status: AC
  Filled 2011-09-29: qty 2

## 2011-09-29 MED ORDER — CEFAZOLIN SODIUM 1-5 GM-% IV SOLN: 1.0000 g | Freq: Once | INTRAVENOUS | Status: DC

## 2011-09-29 MED ORDER — BIVALIRUDIN 250 MG IV SOLR
INTRAVENOUS | Status: AC
  Filled 2011-09-29: qty 250

## 2011-09-29 MED ORDER — REGADENOSON 0.4 MG/5ML IV SOLN
0.4000 mg | Freq: Once | INTRAVENOUS | Status: AC
  Administered 2011-09-29: 0.4 mg via INTRAVENOUS

## 2011-09-29 MED ORDER — ASPIRIN 81 MG PO CHEW: 324.0000 mg | CHEWABLE_TABLET | ORAL | Status: DC

## 2011-09-29 MED ORDER — CEFAZOLIN SODIUM 1-5 GM-% IV SOLN
INTRAVENOUS | Status: AC
  Filled 2011-09-29: qty 50

## 2011-09-29 MED ORDER — ACETAMINOPHEN 325 MG PO TABS: 650.0000 mg | ORAL_TABLET | ORAL | Status: DC | PRN

## 2011-09-29 MED ORDER — SODIUM CHLORIDE 0.9 % IV SOLN: INTRAVENOUS | Status: DC

## 2011-09-29 MED ORDER — DIAZEPAM 5 MG PO TABS: 5.0000 mg | ORAL_TABLET | ORAL | Status: DC

## 2011-09-29 MED ORDER — SODIUM CHLORIDE 0.9 % IJ SOLN: 3.0000 mL | INTRAMUSCULAR | Status: DC | PRN

## 2011-09-29 MED ORDER — SODIUM CHLORIDE 0.9 % IV SOLN: 250.0000 mL | INTRAVENOUS | Status: DC | PRN

## 2011-09-29 MED ORDER — ALPRAZOLAM 0.25 MG PO TABS: 0.2500 mg | ORAL_TABLET | Freq: Two times a day (BID) | ORAL | Status: DC | PRN

## 2011-09-29 MED ORDER — PRASUGREL HCL 10 MG PO TABS
ORAL_TABLET | ORAL | Status: AC
  Filled 2011-09-29: qty 6

## 2011-09-29 MED ORDER — FENTANYL CITRATE 0.05 MG/ML IJ SOLN
INTRAMUSCULAR | Status: AC
  Filled 2011-09-29: qty 2

## 2011-09-29 MED ORDER — ADENOSINE 12 MG/4ML IV SOLN
12.0000 mL | Freq: Once | INTRAVENOUS | Status: DC
  Filled 2011-09-29: qty 12

## 2011-09-29 MED ORDER — TECHNETIUM TC 99M TETROFOSMIN IV KIT
10.0000 | PACK | Freq: Once | INTRAVENOUS | Status: AC | PRN
  Administered 2011-09-29: 10 via INTRAVENOUS

## 2011-09-29 MED ORDER — ONDANSETRON HCL 4 MG/2ML IJ SOLN: 4.0000 mg | Freq: Four times a day (QID) | INTRAMUSCULAR | Status: DC | PRN

## 2011-09-29 MED ORDER — HEPARIN (PORCINE) IN NACL 2-0.9 UNIT/ML-% IJ SOLN
INTRAMUSCULAR | Status: AC
  Filled 2011-09-29: qty 2000

## 2011-09-29 MED ORDER — NITROGLYCERIN 0.2 MG/ML ON CALL CATH LAB
INTRAVENOUS | Status: AC
  Filled 2011-09-29: qty 1

## 2011-09-29 MED ORDER — OXYCODONE-ACETAMINOPHEN 5-325 MG PO TABS: 1.0000 | ORAL_TABLET | ORAL | Status: DC | PRN

## 2011-09-29 MED ORDER — LIDOCAINE HCL (PF) 1 % IJ SOLN
INTRAMUSCULAR | Status: AC
  Filled 2011-09-29: qty 30

## 2011-09-29 MED ORDER — TECHNETIUM TC 99M TETROFOSMIN IV KIT
30.0000 | PACK | Freq: Once | INTRAVENOUS | Status: AC | PRN
  Administered 2011-09-29: 30 via INTRAVENOUS

## 2011-09-29 MED ORDER — SODIUM CHLORIDE 0.9 % IJ SOLN: 3.0000 mL | Freq: Two times a day (BID) | INTRAMUSCULAR | Status: DC

## 2011-09-29 NOTE — Progress Notes (Signed)
ANTICOAGULATION CONSULT NOTE - Follow Up Consult  Pharmacy Consult for Heparin Indication: chest pain/ACS  No Known Allergies  Patient Measurements: Height: 6' 0.83" (185 cm) Weight: 146 lb 6.2 oz (66.4 kg) IBW/kg (Calculated) : 79.52  Heparin Dosing Weight: 66 kg  Vital Signs: Temp: 98.5 F (36.9 C) (05/17 0642) Temp src: Oral (05/17 0642) BP: 134/75 mmHg (05/17 0831) Pulse Rate: 66  (05/17 0831)  Labs:  [name] 09/29/11 0539 09/29/11 0531 09/28/11 2238 09/28/11 1954 09/28/11 1717 09/28/11 1716 09/28/11 1013  HGB -- -- -- -- 15.9 -- 13.9  HCT -- -- -- -- 47.5 -- 41.4  PLT -- -- -- -- 346 -- 296  APTT -- -- -- -- 190* -- --  LABPROT -- -- -- -- 14.3 -- 14.6  INR -- -- -- -- 1.09 -- 1.12  HEPARINUNFRC 0.38 -- -- 0.61 -- -- --  CREATININE 1.06 -- -- -- 1.11 -- 0.94  CKTOTAL -- 121 138 -- -- 178 --  CKMB -- 4.2* 4.0 -- -- 5.0* --  TROPONINI -- <0.30 <0.30 -- -- <0.30 --    Estimated Creatinine Clearance: 70.5 ml/min (by C-G formula based on Cr of 1.06).   Medications:  Infusions:    . heparin    . DISCONTD: nitroGLYCERIN 5 mcg/min (09/28/11 1911)   Assessment: 77 YOM with known CAD (h/o MI and PCI to RCA) admitted 09/28/11 with chest pain on IV heparin. Heparin level is therapeutic. CBC wnl. No bleeding reported. Enzymes negative. Plan for stress test today.   Goal of Therapy:  Heparin level 0.3-0.7 units/ml Monitor platelets by anticoagulation protocol: Yes   Plan:  Continue heparin at 800 units/hr (8 ml/hr).  Check daily heparin levels while on therapy. Check CBC at least daily while on therapy. Will follow-up plans post-stress test.   [name], PharmD, BCPS Clinical Pharmacist [phone] 09/29/2011,8:37 AM

## 2011-09-29 NOTE — Consult Note (Signed)
Came to visit pt but currently in nuclear med.

## 2011-09-29 NOTE — Progress Notes (Signed)
Md called and obtained order to D/c nitro drip.

## 2011-09-29 NOTE — ED Provider Notes (Signed)
  I performed a history and physical examination of [name] and discussed his management with Dr. Romeo Apple.  I agree with the history, physical, assessment, and plan of care, with the following exceptions: None  I have also seen and agree with the interpretation of all imaging / ecg studies.  In short, this 61yo M w notable cardiac Hx now p/w CP, that improved only after significant interventions.  Although the patient became entirely asymptomatic, given his story, and RF, he was admitted for further E/M.  Mendell Bontempo, Elvis Coil, MD 09/29/11 [phone]

## 2011-09-29 NOTE — CV Procedure (Signed)
Cardiac cath/definite for measurement report dictated on 09/29/2011 dictation [phone]

## 2011-09-29 NOTE — Consult Note (Signed)
Pt back from his tests and in his room. He states that he was a 2 ppd smoker but has cut down to 1/2 ppd on his own gradually. Congratulated pt. He plans to continue cutting down gradually on his own. He is not interested in medical aids at this time. Referred to 1-800 quit now for f/u and support. Discussed oral fixation substitutes, second hand smoke and in home smoking policy. Reviewed and gave pt Written education/contact information.

## 2011-09-29 NOTE — Progress Notes (Signed)
EKG changes noted. Dr Sharyn Lull paged. PT is in no distress at this time and denies any pain. Enzymes have been negative. BP 120/70 HR 46. Will continue to monitor and awaiting return call from MD.

## 2011-09-29 NOTE — Progress Notes (Signed)
No new orders received. Pt still planned for stress test today.  Will continue to monitor.

## 2011-09-29 NOTE — Cardiovascular Report (Signed)
NAMEMONTERRIO, GERST NO.:  [id number]  MEDICAL RECORD NO.:  [id number]  LOCATION:  [facility]                         FACILITY:  [facility]  PHYSICIAN:  Dennis Killilea N. Sharyn Lull, M.D. DATE OF BIRTH:  [birth year]  DATE OF PROCEDURE:  09/29/2011 DATE OF DISCHARGE:                           CARDIAC CATHETERIZATION   PROCEDURE: 1. Left cardiac cath with selective left and right coronary     angiography via right groin using Judkins technique. 2. Fractional flow reserve measurement of the distal right coronary     artery using volcano flow wire. 3. Successful closure of right femoral arteriotomy using Perclose.  INDICATION FOR THE PROCEDURE:  Mr. 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  He came to the ER complaining of sudden onset of retrosternal and right-sided chest pain associated with feeling weak and dizzy and right arm numbness and no energy while at work.  The patient describes chest pain as tightness, grade 5-6/10, associated with mild shortness of breath and palpitation. Denies any lightheadedness or syncope.  He received sublingual nitro and morphine sulfate with relief of chest pain.  The patient is noncompliant to medication, followup, and has not been seen in my office for approximately 1 year.  The patient was admitted to telemetry unit.  MI was ruled out by serial enzymes and EKG.  The patient subsequently underwent today Lexiscan Myoview stress test, which showed old inferolateral wall myocardial infarction with a  moderate area of peri- infarct ischemia with ejection fraction of 57%.  Due to the chest pain, positive stress test, multiple risk factors, and history of prior MI, I discussed with the patient regarding left cath, possible PTCA with stenting, its risks and benefits, i.e., death, MI, stroke, need for emergency CABG, local vascular complications, etc. and consented for PCI.  PROCEDURE:  After obtaining the informed consent, the patient was brought to the cath lab and was placed on fluoroscopy table.  Right groin was prepped and draped in usual fashion.  Xylocaine 1% was used for local anesthesia in the right groin.  With the help of thin wall needle, a 6-French arterial sheath was placed.  The sheath was aspirated and flushed.  A 6-French left Judkins catheter was advanced over the wire under fluoroscopic guidance up to the ascending aorta.  Wire was pulled out. The catheter was aspirated and connected to the Manifold.  Catheter was further advanced and engaged into left coronary ostium.  Multiple views of the left system were taken.  The catheter was disengaged and was pulled out over the wire and was replaced with 6-French right Judkins catheter, which was advanced over the wire under fluoroscopic guidance up to the ascending aorta.  Wire was pulled out.  The catheter was aspirated and connected to the Manifold.  Catheter was further advanced and engaged into  right coronary ostium.  Multiple views of the right system were taken.  The catheter was disengaged and was pulled out over the wire and was replaced with a 6-French pigtail catheter, which was advanced over the wire under fluoroscopic guidance up to the ascending aorta.  Catheter was further advanced across the aortic valve into the LV.  LV pressures were recorded.  LV graft was done in 30-degree RAO position.  Post-angiographic pressures were recorded from LV and then pullback pressures were recorded from the aorta.  There  was no gradient across the aortic valve.  The pigtail catheter was pulled out over the wire.  Sheaths were aspirated and flushed.  FINDINGS:  LV showed infero-mid wall hypokinesia.  EF of approximately 50-55%.  Left main was long, which had 20-30% distal stenosis.  LAD had 30-40% mid stenosis.  Diagonal 1 and 2 were very small, which had mild disease.  Ramus had 20-25% ostial stenosis.  Left circumflex was small, which was patent.  RCA had 10-20% mid stenosis and 60-70% stenosis at the proximal and distal edge of the stent with haziness and also 40-50% mid in-stent restenosis in distal RCA.  PDA and PLV branches were small, which were patent.  Due to chest pain and positive nuclear stress test and moderate in-stent restenosis, FFR was done using volcano flow wire to evaluate the significance of this lesion.  FFR was measured after giving 150 mcg/kg/minute of IV adenosine over 3 minutes.  The FFR was 0.92, which was not hemodynamically significant.  The patient received IV Angiomax and 60 mg of prasugrel prior to FFR.  The arteriotomy at the end of the procedure was closed by using a Perclose without difficulty. The patient tolerated the procedure well.  There were no complications. The patient was transferred to recovery room in stable condition.     Eduardo Osier. Sharyn Lull, M.D.     M[facility]/MEDQ  D:  09/29/2011  T:  09/29/2011  Job:  [id number]

## 2011-09-29 NOTE — Progress Notes (Signed)
Subjective:  Patient presently denies any chest pain underwent nuclear stress test today which showed inferolateral wall scar with significant peri-infarct ischemia EF of 57%  Objective:  Vital Signs in the last 24 hours: Temp:  [97.8 F (36.6 C)-98.5 F (36.9 C)] 98.5 F (36.9 C) (05/17 1407) Pulse Rate:  [41-66] 53  (05/17 1407) Resp:  [16-18] 18  (05/17 1407) BP: (116-138)/(70-87) 116/70 mmHg (05/17 1407) SpO2:  [96 %-99 %] 97 % (05/17 1407)  Intake/Output from previous day:   Intake/Output from this shift: Total I/O In: 360 [P.O.:360] Out: -   Physical Exam: Neck: no adenopathy, no carotid bruit, no JVD and supple, symmetrical, trachea midline Lungs: clear to auscultation bilaterally Heart: regular rate and rhythm, S1, S2 normal and Soft systolic murmur noted no S3 gallop Abdomen: soft, non-tender; bowel sounds normal; no masses,  no organomegaly Extremities: extremities normal, atraumatic, no cyanosis or edema  Lab Results:  Basename 09/28/11 1717 09/28/11 1013  WBC 8.2 6.3  HGB 15.9 13.9  PLT 346 296    Basename 09/29/11 0539 09/28/11 1717  NA 138 141  K 4.1 4.5  CL 104 101  CO2 25 31  GLUCOSE 87 92  BUN 15 11  CREATININE 1.06 1.11    Basename 09/29/11 0531 09/28/11 2238  TROPONINI <0.30 <0.30   Hepatic Function Panel  Basename 09/28/11 1717  PROT 7.5  ALBUMIN 4.2  AST 20  ALT 11  ALKPHOS 55  BILITOT 0.6  BILIDIR --  IBILI --    Basename 09/29/11 0539  CHOL 158   No results found for this basename: PROTIME in the last 72 hours  Imaging: Imaging results have been reviewed and Ct Head Wo Contrast  09/28/2011  *RADIOLOGY REPORT*  Clinical Data: Right-sided weakness.  Dizziness.  CT HEAD WITHOUT CONTRAST  Technique:  Contiguous axial images were obtained from the base of the skull through the vertex without contrast.  Comparison: MRI 07/04/2005  Findings: No acute intracranial abnormality.  Specifically, no hemorrhage, hydrocephalus, mass lesion,  acute infarction, or significant intracranial injury.  No acute calvarial abnormality. Visualized paranasal sinuses and mastoids clear.  Orbital soft tissues unremarkable.  IMPRESSION: No acute intracranial abnormality.  Original Report Authenticated By: Cyndie Chime, M.D.   Nm Myocar Multi W/spect W/wall Motion / Ef  09/29/2011  *RADIOLOGY REPORT*  Clinical Data:  Chest pain.  MYOCARDIAL IMAGING WITH SPECT (REST AND PHARMACOLOGIC-STRESS) GATED LEFT VENTRICULAR WALL MOTION STUDY LEFT VENTRICULAR EJECTION FRACTION  Technique:  Standard myocardial SPECT imaging was performed after resting intravenous injection of 10 mCi Tc-73m tetrofosmin. Subsequently, intravenous infusion of Lexiscan was performed under the supervision of the Cardiology staff.  At peak effect of the drug, 30 mCi Tc-35m tetrofosmin was injected intravenously and standard myocardial SPECT  imaging was performed.  Quantitative gated imaging was also performed to evaluate left ventricular wall motion, and estimate left ventricular ejection fraction.  Comparison:  11/13/2008  Findings: SPECT imaging demonstrates defect on the rest images within the inferolateral wall in the area of previously seen old infarct.  The defect enlarges on the stress images compatible with significant periinfarct ischemia.  Quantitative gated analysis shows decreased motion within the inferolateral wall, otherwise unremarkable.  The resting left ventricular ejection fraction is 57% with end- diastolic volume of 81 ml and end-systolic volume of 34 ml.  IMPRESSION: Old inferolateral infarct with peri-infarct ischemia.  Ejection fraction 57%.  Original Report Authenticated By: Cyndie Chime, M.D.   Dg Chest Port 1 View  09/28/2011  *  RADIOLOGY REPORT*  Clinical Data: Chest pain, right-sided numbness.  PORTABLE CHEST - 1 VIEW  Comparison: 12/01/2010  Findings: Hyperinflation of the lungs, stable.  No confluent airspace opacities visualized.  The left base is incompletely  imaged.  Heart is normal size.  No visible effusions.  IMPRESSION: Hyperinflation.  No visible acute findings.  Left base incompletely imaged.  Original Report Authenticated By: Cyndie Chime, M.D.    Cardiac Studies:  Assessment/Plan:  Acute coronary syndrome MI ruled out positive nuclear stress test Coronary artery disease history of inferior wall myocardial infarction in the past status post PTCA stenting to distal RCA in the past  Hypertension  Hypercholesteremia  History of SVT  Tobacco abuse  History of peptic ulcer disease  History of cocaine and marijuana abuse  Degenerative joint disease  Positive family history of coronary artery disease  Marked sinus bradycardia Plan Discussed with patient regarding stress test result and left cardiac cath with selective left and right coronary angiograph EL [place] PTCA stenting its risk and benefits i.e. death MI stroke need for emergency CABG local by slow complications risk of restenosis etc. and consented for PCI  LOS: 1 day    Erva Koke N 09/29/2011, 4:09 PM

## 2011-09-30 MED ORDER — AMLODIPINE BESYLATE 5 MG PO TABS: 5.0000 mg | ORAL_TABLET | Freq: Every day | ORAL | Status: DC

## 2011-09-30 MED ORDER — CLOPIDOGREL BISULFATE 75 MG PO TABS: 75.0000 mg | ORAL_TABLET | Freq: Every day | ORAL | Status: AC

## 2011-09-30 MED ORDER — NITROGLYCERIN 0.4 MG SL SUBL: 0.4000 mg | SUBLINGUAL_TABLET | SUBLINGUAL | Status: DC | PRN

## 2011-09-30 MED ORDER — ATORVASTATIN CALCIUM 40 MG PO TABS: 40.0000 mg | ORAL_TABLET | Freq: Every day | ORAL | Status: DC

## 2011-09-30 NOTE — Progress Notes (Signed)
Pt ambulated to bathroom with assistance. Pt right groin site level 0. No complications. Will continue to monitor.

## 2011-09-30 NOTE — Discharge Summary (Signed)
NAMEGARYSON, STELLY NO.:  [id number]  MEDICAL RECORD NO.:  [id number]  LOCATION:  3711                         FACILITY:  [facility]  PHYSICIAN:  Eduardo Osier. Sharyn Lull, M.D. DATE OF BIRTH:  26-Oct-1950  DATE OF ADMISSION:  09/28/2011 DATE OF DISCHARGE:  09/30/2011                              DISCHARGE SUMMARY   ADMITTING DIAGNOSES:  Acute coronary syndrome, rule out myocardial infarction, coronary artery disease, history of inferior wall myocardial infarction in the past, status post PTCA stenting to RCA in 2008, hypertension, hypercholesteremia, history of supraventricular tachycardia, tobacco abuse, history of peptic ulcer disease, history of cocaine and marijuana abuse in the past, degenerative joint disease, positive family history of coronary artery disease, and marked sinus bradycardia.  DISCHARGE DIAGNOSES:  Stable angina, myocardial infarction ruled out, positive nuclear stress test, status post left cardiac cath, and fractional flow reserve measurement of the right coronary artery, which was not physiologically significant, coronary artery disease, history of inferior myocardial infarctions, status post PTCA stenting to distal RCA in April 2008, hypertension, hypercholesteremia, history of supraventricular tachycardia, tobacco abuse, history of peptic ulcer disease, history of cocaine and marijuana abuse in the past, degenerative joint disease, positive family history of coronary artery disease, and marked sinus bradycardia, stable.  DISCHARGE HOME MEDICATIONS: 1. Enteric-coated aspirin 81 mg 1 tablet daily. 2. Amlodipine 5 mg 1 tablet daily. 3. Atorvastatin 40 mg 1 tablet daily. 4. Clopidogrel 75 mg 1 tablet daily. 5. Nitrostat 0.4 mg use as directed.  DIET:  Low salt, low cholesterol.  ACTIVITY:  Increase activity slowly as tolerated.  Post cardiac cath instructions have been given.  Follow up with me in 1 week.  The patient has been advised to  refrain from smoking and avoid risk.  Condition at discharge is able.  BRIEF HISTORY AND HOSPITAL COURSE:  Mr. Lemmerman is 61 year old male with past medical history significant for coronary artery disease, history of inferior wall myocardial infarction in April 2008 requiring primary PTCA and stent to distal RCA, hypertension, hypercholesteremia, history of SVT, tobacco abuse, history of cocaine and marijuana abuse, history of peptic ulcer disease, history of degenerative joint disease, positive family history of coronary artery disease.  He came to the ER via EMS complaining of sudden onset of retrosternal and right-sided chest pain associated with feeling weak and dizzy and right arm numbness and no energy while at work.  The patient describes chest pain as tightness grade 5 to 6/10, associated with mild shortness of breath and palpitation.  Denies any lightheadedness or syncope.  The patient received sublingual nitro and morphine sulfate with relief of chest pain.  The patient is noncompliant to medication and followup and has not been seen in my office in the last approximately 1 year.  PAST MEDICAL HISTORY:  As above.  MEDICATION AT HOME:  He was taking aspirin off and on.  EXAMINATION:  VITAL SIGNS: His blood pressure was 134/77, pulse was 49. He was afebrile. EYES: Conjunctivae was pink. NECK: Supple.  No JVD.  No bruit. LUNGS: Clear to auscultation without rhonchi or rales. CARDIOVASCULAR: S1, S2 were normal, was bradycardic.  There was soft systolic murmur.  No S3,  gallop, or rub. ABDOMEN: Soft.  Bowel sounds were present.  Nontender. EXTREMITIES: No clubbing, cyanosis, or edema.  LABS:  The patient underwent Lexiscan Myoview yesterday which showed old inferolateral wall infarction with peri-infarct ischemia with EF of 57%. He has 2 sets of cardiac enzymes, troponin I were negative.  His total CK was 178, MB 5.1.  Next set, CK 138, MB 4.0.  Next set, CK 121, MB 4.2.   Sodium was 139, potassium 4.3, BUN 10, creatinine 0.94, hemoglobin was 13.9, hematocrit 41.4, white count of 6.3, TSH was 1.09. The patient also had CT of the brain in the ER which showed no acute intracranial abnormality.  EKG showed sinus bradycardia, left ventricular hypertrophy with strain pattern versus ischemia, old inferior wall myocardial infarction.  BRIEF HOSPITAL COURSE:  The patient was admitted to telemetry unit.  MI was ruled out by serial enzymes and EKG.  The patient subsequently underwent Lexiscan Myoview stress test, which showed inferolateral wall scarring with peri-infarct ischemia.  Subsequently, the patient underwent left cardiac cath with selective left and right coronary angiography, LV graphy and fractional flow reserve.  As per procedure report, the patient tolerated procedure well.  The patient's postprocedure did not have any episodes of chest pain during the hospital stay.  His groin is stable with no evidence of hematoma or bruit.  The patient has been ambulating in hallway without any problems and will be discharged home on above medications.  The patient will be followed up in my office in 1 week.     Eduardo Osier. Sharyn Lull, M.D.     [facility]/MEDQ  D:  09/30/2011  T:  09/30/2011  Job:  [id number]

## 2011-09-30 NOTE — Discharge Summary (Signed)
  Discharge summary dictated on 09/30/2011 dictation number is [id number]

## 2011-09-30 NOTE — Discharge Instructions (Signed)
Coronary Angiography with Stent This is a procedure to widen or open a narrow blood vessel of the heart (coronary artery). When a coronary artery becomes partially blocked it decreases blood flow to that area. This may lead to chest pain or a heart attack (myocardial infarction). Arteries may become blocked by cholesterol buildup (plaque) in the lining or wall. A stent is a small piece of metal that looks like a mesh or a spring. Stent placement may be done right after an angiogram that finds a blocked artery or as a treatment for a heart attack. RISKS AND COMPLICATIONS  Damage to the heart.   A blockage may return.   Bleeding at the site.   Blood clot to another part of the body.  PROCEDURE  You may be given a medication to help you relax before and during the procedure through an IV in your hand or arm.   A local anesthetic to make the area numb may be used before inserting the catheter (a long, hollow tube about the size of a piece of cooked spaghetti).   You will be prepared for the procedure by washing and shaving the area where the catheter will be inserted. This is usually done in the groin.   A specially trained doctor will insert the catheter with a guide wire into an artery. This is guided under a special type of X-ray (fluoroscopy) to the opening of the blocked artery.   Special dye is then injected and X-rays are taken.   A tiny wire is guided to the blocked spot and a balloon is inflated to make the artery wider. The stent is expanded and crushes the plaque into the wall of the vessel. The stent holds the area open like a scaffolding and improves the blood flow.   Sometimes the artery may be made wider using a laser or other tools to remove plaque.   When the blood flow is better, the catheter is removed. The lining of the artery will grow over the stent which stays where it was placed.  AFTER THE PROCEDURE  You will stay in bed for several hours.   The access site will  be watched and you will be checked frequently.   Blood tests, X-rays and an EKG may be done.   You may stay in the hospital overnight for observation.  SEEK IMMEDIATE MEDICAL CARE IF:   You develop chest pain, shortness of breath, feel faint, or pass out.   There is bleeding, swelling, or drainage from the catheter insertion site.   You develop pain, discoloration, coldness, or severe bruising in the leg or arm that held the catheter.   You see blood in your urine or stool. This may be bright red blood in urine or stools, or also appear as black, tarry stools.   You have a fever.  Document Released: 11/05/2002 Document Revised: 04/20/2011 Document Reviewed: 06/28/2007 [facility] Patient Information 2012 [place], [place].Coronary Angiography Coronary angiography is an X-ray procedure used to look at the arteries in the heart. In this procedure, a dye is injected through a long, hollow tube (catheter). The catheter is about the size of a piece of cooked spaghetti. The catheter injects a dye into an artery in your groin. X-rays are then taken to show if there is a blockage in the arteries of your heart. BEFORE THE PROCEDURE   Let your caregiver know if you have allergies to shellfish or contrast dye. Also let your caregiver know if you have kidney problems  or failure.   Do not eat or drink starting from midnight up to the time of the procedure, or as directed.   You may drink enough water to take your medications the morning of the procedure if you were instructed to do so.   You should be at the hospital or outpatient facility where the procedure is to be done 60 minutes prior to the procedure or as directed.  PROCEDURE  You may be given an IV medication to help you relax before the procedure.   You will be prepared for the procedure by washing and shaving the area where the catheter will be inserted. This is usually done in the groin but may be done in the fold of your arm by your elbow.     A medicine will be given to numb your groin where the catheter will be inserted.   A specially trained doctor will insert the catheter into an artery in your groin. The catheter is guided by using a special type of X-ray (fluoroscopy) to the blood vessel being examined.   A special dye is then injected into the catheter and X-rays are taken. The dye helps to show where any narrowing or blockages are located in the heart arteries.  AFTER THE PROCEDURE   After the procedure you will be kept in bed lying flat for several hours. You will be instructed to not bend or cross your legs.   The groin insertion site will be watched and checked frequently.   The pulse in your feet will be checked frequently.   Additional blood tests, X-rays and an EKG may be done.   You may stay in the hospital overnight for observation.  SEEK IMMEDIATE MEDICAL CARE IF:   You develop chest pain, shortness of breath, feel faint, or pass out.   There is bleeding, swelling, or drainage from the catheter insertion site.   You develop pain, discoloration, coldness, or severe bruising in the leg or area where the catheter was inserted.   You have a fever.  Document Released: 11/05/2002 Document Revised: 04/20/2011 Document Reviewed: 12/25/2007 [facility] Patient Information 2012 [place], [place].

## 2011-10-02 MED FILL — Dextrose Inj 5%: INTRAVENOUS | Qty: 50 | Status: AC

## 2011-12-09 ENCOUNTER — Encounter: Payer: Self-pay | Admitting: Family Medicine

## 2011-12-11 ENCOUNTER — Encounter: Payer: Self-pay | Admitting: Family Medicine

## 2012-07-01 ENCOUNTER — Encounter: Payer: Self-pay | Admitting: Family Medicine

## 2012-07-01 ENCOUNTER — Ambulatory Visit (INDEPENDENT_AMBULATORY_CARE_PROVIDER_SITE_OTHER): Payer: BC Managed Care – PPO | Admitting: Family Medicine

## 2012-07-01 VITALS — BP 142/79 | HR 59 | Temp 98.0°F | Ht 73.0 in | Wt 155.0 lb

## 2012-07-01 DIAGNOSIS — R059 Cough, unspecified: Secondary | ICD-10-CM | POA: Insufficient documentation

## 2012-07-01 DIAGNOSIS — R05 Cough: Secondary | ICD-10-CM | POA: Insufficient documentation

## 2012-07-01 HISTORY — DX: Cough, unspecified: R05.9

## 2012-07-01 MED ORDER — AZITHROMYCIN 500 MG PO TABS: 500.0000 mg | ORAL_TABLET | Freq: Every day | ORAL | Status: DC

## 2012-07-01 MED ORDER — GUAIFENESIN-DM 100-10 MG/5ML PO SYRP: 5.0000 mL | ORAL_SOLUTION | Freq: Three times a day (TID) | ORAL | Status: DC | PRN

## 2012-07-01 NOTE — Assessment & Plan Note (Signed)
He has had productive yellow cough that has been worsening over the past 5 days and chills but no fevers.  D/Dx: viral/bacterial URI, pneumonia, influenza -Although likely first, he has had severe episodes of pneumonia in the past. Due to his significant cardiac history and history of tobacco, treat at this time for CAP with azithromycin. -Rx for cough medicine given.  -Advised to take Tylenol as needed for pain/fevers (okay and will not interact with cardiac medications).  -Follow-up as needed, notably if chest pain, symptoms worsen despite course of antibiotics.

## 2012-07-01 NOTE — Patient Instructions (Addendum)
Try the cough medicine Take the antibiotic for 5 days  If you feel worse or are not getting better in a week, please follow-up with [place]  Work note through Tuesday

## 2012-07-01 NOTE — Progress Notes (Signed)
  Subjective:    Patient ID: [name], male    DOB: 11/21/1950, 62 y.o.   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  Smoking status noted. He has had pneumonia twice in the past. He had been hospitalized once. Not intubated.      Objective:   Physical Exam Gen: appears ill and uncomfortable PSYCH: appropriate to questions, alert and oriented fully HEENT:   Head: mild tenderness along maxillary sinuses L>R   Eyes: normal conjunctiva without injection or tearing   Nose: nasal congestion with clear nasal discharge   Mouth: MMM; no tonsillar adenopathy; no oropharyngeal erythema NECK: no LAD CV: RRR PULM: NI WOB; CTAB without w/r/r SKIN: warm, dry, mildly diaphoretic    Assessment & Plan:

## 2012-07-18 ENCOUNTER — Encounter: Payer: Self-pay | Admitting: *Deleted

## 2012-07-18 ENCOUNTER — Telehealth: Payer: Self-pay | Admitting: Family Medicine

## 2012-07-18 ENCOUNTER — Ambulatory Visit (INDEPENDENT_AMBULATORY_CARE_PROVIDER_SITE_OTHER): Payer: BC Managed Care – PPO | Admitting: Family Medicine

## 2012-07-18 ENCOUNTER — Encounter: Payer: Self-pay | Admitting: Family Medicine

## 2012-07-18 VITALS — BP 154/88 | HR 59 | Temp 98.5°F | Ht 73.0 in | Wt 151.2 lb

## 2012-07-18 DIAGNOSIS — S0501XA Injury of conjunctiva and corneal abrasion without foreign body, right eye, initial encounter: Secondary | ICD-10-CM | POA: Insufficient documentation

## 2012-07-18 DIAGNOSIS — H5710 Ocular pain, unspecified eye: Secondary | ICD-10-CM

## 2012-07-18 DIAGNOSIS — S058X9A Other injuries of unspecified eye and orbit, initial encounter: Secondary | ICD-10-CM

## 2012-07-18 DIAGNOSIS — H5711 Ocular pain, right eye: Secondary | ICD-10-CM

## 2012-07-18 HISTORY — DX: Injury of conjunctiva and corneal abrasion without foreign body, right eye, initial encounter: S05.01XA

## 2012-07-18 MED ORDER — POLYMYXIN B-TRIMETHOPRIM 10000-0.1 UNIT/ML-% OP SOLN: 1.0000 [drp] | OPHTHALMIC | Status: DC

## 2012-07-18 NOTE — Assessment & Plan Note (Addendum)
Obvious corneal abrasion of the R eye w/ decreased visual acuity. No apparent globe injury Start on Polytrim drops Refer to ortho of urgent evaluation. Staff and pt working on being seen later today/tomorrow Work note provided to allow to stay out of working w/ machinery until [employer].  Precuations given

## 2012-07-18 NOTE — Telephone Encounter (Signed)
Returned call to patient.  Was at work yesterday and got something in his eye ("possibly dust or metal").  C/o right eye "pain and pressure and swollen on top and bottom."  Patient does not have an eye doctor.  Scheduled appt for this morning [facility] with Dr. Konrad Dolores at 10:30 am.  Gaylene Brooks, RN

## 2012-07-18 NOTE — Patient Instructions (Addendum)
You have an abrasion of your cornea You need to be seen by an opthamologist Please start using the eye drops as directed. Please call back if you have any worsening of your eye pain or visual ability  Corneal Abrasion The cornea is the clear covering at the front and center of the eye. When looking at the colored portion (iris) of the eye, you are looking through that person's cornea.  This very thin tissue is made up of many layers. The surface layer is a single layer of cells called the corneal epithelium. This is one of the most sensitive tissues in the body. If a scratch or injury causes the corneal epithelium to come off, it is called a corneal abrasion. If the injury extends to the tissues below the epithelium, the condition is called a corneal ulcer.  CAUSES   Scratches.  Trauma.  Foreign body in the eye.  Some people have recurrences of abrasions in the area of the original injury even after they heal. This is called recurrent erosion syndrome. Recurrent erosion syndromes generally improve and go away with time. SYMPTOMS   Eye pain.  Difficulty or inability to keep the injured eye open.  The eye becomes very sensitive to light.  Recurrent erosions tend to happen suddenly, first thing in the morning  usually upon awakening and opening the eyes. DIAGNOSIS  Your eye professional can diagnose a corneal abrasion during an eye exam. Dye is usually placed in the eye using a drop or a small paper strip moistened by the patient's tears. When the eye is examined with a special light, the abrasion shows up clearly because of the dye. TREATMENT   Small abrasions may be treated with antibiotic drops or ointment alone.  Usually a pressure patch is specially applied. Pressure patches prevent the eye from blinking, allowing the corneal epithelium to heal. Because blinking is less, a pressure patch also reduces the amount of pain present in the eye during healing. Most corneal abrasions heal  within 2-3 days with no effect on vision. WARNING: Do not drive or operate machinery while your eye is patched. Your ability to judge distances is impaired.  If abrasion becomes infected and spreads to the deeper tissues of the cornea, a corneal ulcer can result. This is serious because it can cause corneal scarring. Corneal scars interfere with light passing through the cornea, and cause a loss of vision in the involved eye.  If your caregiver has given you a follow-up appointment, it is very important to keep that appointment. Not keeping the appointment could result in a severe eye infection or permanent loss of vision. If there is any problem keeping the appointment, you must call back to this facility for assistance. SEEK MEDICAL CARE IF:   You have pain, light sensitivity and a scratchy feeling in one eye (or both).  Your pressure patch keeps loosening up and you can blink your eye under the patch after treatment.  Any kind of discharge develops from the involved eye after treatment or if the lids stick together in the morning.  You have the same symptoms in the morning as you did with the original abrasion days, weeks or months after the abrasion healed. MAKE SURE YOU:   Understand these instructions.  Will watch your condition.  Will get help right away if you are not doing well or get worse. Document Released: 04/28/2000 Document Revised: 07/24/2011 Document Reviewed: 12/05/2007 [facility] Patient Information 2013 [place], [place].

## 2012-07-18 NOTE — Progress Notes (Signed)
[name] is a 62 y.o. male who presents to [facility] today for R eye pain  Injury to R eye occurred yesterday. Works at a [employer]. 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 Immediately painful/pressure and feels like something in the eye. Swelling started last night. No real pain, just pressure. No alleivating factors. Tried a cold rag w/ some benefit. Has noticed a change in vision. No discharge out of eye. No h/o previous injuries to eyes.    The following portions of the patient's history were reviewed and updated as appropriate: allergies, current medications, past medical history, family and social history, and problem list.  Patient is a smoker (1/2ppd)  Past Medical History  Diagnosis Date  . Coronary artery disease   . Hypertension   . High cholesterol   . Arthritis   . Chronic lower back pain   . Angina   . Peptic ulcer disease   . DJD (degenerative joint disease)   . SVT (supraventricular tachycardia)   . Sinus bradycardia 09/28/11  . 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 MI (myocardial infarction) 2011  . Pneumonia     "when I was younger"  . Walking pneumonia ~ 2009    ROS as above otherwise neg.    Medications reviewed. Current Outpatient Prescriptions  Medication Sig Dispense Refill  . amLODipine (NORVASC) 5 MG tablet Take 1 tablet (5 mg total) by mouth daily.  30 tablet  3  . aspirin 81 MG chewable tablet Chew 81 mg by mouth daily.      Marland Kitchen atorvastatin (LIPITOR) 40 MG tablet Take 1 tablet (40 mg total) by mouth daily at 6 PM.  30 tablet  3  . azithromycin (ZITHROMAX) 500 MG tablet Take 1 tablet (500 mg total) by mouth daily.  5 tablet  0  . clopidogrel (PLAVIX) 75 MG tablet Take 1 tablet (75 mg total) by mouth daily with breakfast.  30 tablet  3  . guaiFENesin-dextromethorphan (ROBITUSSIN DM) 100-10 MG/5ML syrup Take 5 mLs by mouth 3 (three) times daily as needed for cough.  118 mL  0  . nitroGLYCERIN  (NITROSTAT) 0.4 MG SL tablet Place 1 tablet (0.4 mg total) under the tongue every 5 (five) minutes x 3 doses as needed for chest pain.  25 tablet  3   No current facility-administered medications for this visit.    Exam: BP 154/88  Pulse 59  Temp(Src) 98.5 F (36.9 C) (Oral)  Ht 6\' 1"  (1.854 m)  Wt 151 lb 3 oz (68.578 kg)  BMI 19.95 kg/m2 Gen: Well NAD HEENT: EOMI,  MMM, R eye injection w/o obvious foreign body or globe injury. Opthalmoscope exam w/o obvious injury. Fluorescence lamp exam w/ significant pooling of fluorescence under the pupil and medial aspects of the iris.  Lungs: CTABL Nl WOB Heart: RRR no MRG Neuro: CN 2-12 grossly intact. Moves all extremities spontaneously  Abd: NABS, NT, ND Exts: Non edematous BL  LE, warm and well perfused.   No results found for this or any previous visit (from the past 72 hour(s)).

## 2012-07-18 NOTE — Telephone Encounter (Signed)
Got something in his eye yesterday at work and now it is puffy and red - wants to come in

## 2013-01-03 ENCOUNTER — Ambulatory Visit (INDEPENDENT_AMBULATORY_CARE_PROVIDER_SITE_OTHER): Payer: BC Managed Care – PPO | Admitting: Family Medicine

## 2013-01-03 ENCOUNTER — Encounter: Payer: Self-pay | Admitting: Family Medicine

## 2013-01-03 VITALS — BP 141/58 | HR 60 | Temp 98.3°F | Ht 71.0 in | Wt 145.0 lb

## 2013-01-03 DIAGNOSIS — J209 Acute bronchitis, unspecified: Secondary | ICD-10-CM

## 2013-01-03 MED ORDER — BENZONATATE 100 MG PO CAPS: 100.0000 mg | ORAL_CAPSULE | Freq: Two times a day (BID) | ORAL | Status: DC | PRN

## 2013-01-03 MED ORDER — AZITHROMYCIN 500 MG PO TABS: 500.0000 mg | ORAL_TABLET | Freq: Every day | ORAL | Status: DC

## 2013-01-03 NOTE — Progress Notes (Signed)
Subjective:     Patient ID: [name], male   DOB: [birth year], 62 y.o.   MRN: [id number]  HPI  [name] is a 62 yo with a hx of HTN, HLD, CAD, PUD, hx of MI and hx of recurrent PNA who presents today for persistent cough.   - has had a cough for the last 10 days - fits of coughing - worse at nighttime -occasionally productive  Started off with nasal congestion and sore throat. That has resolved No fevers, chills, sob, wheezing   +Fatigue +smoker- 0.5 PPD    Review of Systems See above     Objective:   Physical Exam GEN: NAD HEENT: nares erythematous without swelling. Oropharynx clear. Poor dentition CV: RRR, no murmurs/rubs/gallops PULM: diffuse rhonchi worse in middle lobes than apices.  Good air movement throughout.   ABD: soft EXT: no swelling, 2+ pulses     Assessment:     Acute bronchitis       Plan:     -suspect working atypical PNA vs acute bronchitis  - rx of azithromycin 500mg  x 5 days for pertussis coverage also - encouraged to quit smoking - tessalon perrles for cough given he needs to cont working - f/u if no improvement or worsening - work note given  Encouraged him to set up appt with PCP also as has not been seen in this clinic for routine care in months.

## 2013-01-03 NOTE — Patient Instructions (Addendum)
Take your antibiotics Lets try the tessalon perrles for cough so that you can keep working through it Let us know if things don't get better.

## 2013-02-24 ENCOUNTER — Ambulatory Visit (INDEPENDENT_AMBULATORY_CARE_PROVIDER_SITE_OTHER): Payer: BC Managed Care – PPO | Admitting: Family Medicine

## 2013-02-24 ENCOUNTER — Encounter: Payer: Self-pay | Admitting: Family Medicine

## 2013-02-24 VITALS — BP 122/67 | HR 56 | Temp 98.8°F | Ht 71.0 in | Wt 156.0 lb

## 2013-02-24 DIAGNOSIS — J329 Chronic sinusitis, unspecified: Secondary | ICD-10-CM | POA: Insufficient documentation

## 2013-02-24 MED ORDER — AZITHROMYCIN 500 MG PO TABS: 500.0000 mg | ORAL_TABLET | Freq: Every day | ORAL | Status: DC

## 2013-02-24 MED ORDER — BENZONATATE 100 MG PO CAPS: 100.0000 mg | ORAL_CAPSULE | Freq: Two times a day (BID) | ORAL | Status: DC | PRN

## 2013-02-24 NOTE — Progress Notes (Signed)
Family Medicine Office Visit Note   Subjective:   Patient ID: [name], male  DOB: [birth year], 62 y.o.. MRN: [id number]   Pt that comes today for an appointment complaining of sinus pain for about 3 days. Patient reports has had upper respiratory symptoms for a while and since Friday he started having frontal headache pressure like, decreased and his sense of smell, and yellowish/greenish rhinorrhea. He also reports mild dry cough and is requesting Jerilynn Som he had use in the past with good response. Patient denies any fever reports chills and night. No nausea vomiting or diarrhea. Denies shortness of breath or sputum production.    Review of Systems:  Per HPI  Objective:   Physical Exam: Gen:  NAD HEENT: Moist mucous membranes. Tenderness to pressure over frontal and maxillary sinuses. Decreased translucency of right frontal and right maxillary sinuses. Erythematosus oropharynx without exudates. Neck without adenopathies and supple. CV: Regular rate and rhythm, no murmurs rubs or gallops PULM: Clear to auscultation bilaterally. EXT: No edema Neuro: Alert and oriented x3. No focalization  Assessment & Plan:

## 2013-02-24 NOTE — Assessment & Plan Note (Signed)
Per clinical impression.  We'll start azithromycin for 5 days. Tessalon pearls added for his symptoms of cough, although discussed this might be secondary to postnasal dripping.  Discussed signs of worsening condition that should prompt re-evaluation. Followup as needed. Letter for work sent.

## 2013-02-24 NOTE — Patient Instructions (Signed)

## 2013-05-30 ENCOUNTER — Other Ambulatory Visit (HOSPITAL_COMMUNITY): Payer: Self-pay | Admitting: Cardiology

## 2013-05-30 DIAGNOSIS — R079 Chest pain, unspecified: Secondary | ICD-10-CM

## 2013-06-01 ENCOUNTER — Encounter (HOSPITAL_COMMUNITY): Payer: Self-pay | Admitting: Emergency Medicine

## 2013-06-01 ENCOUNTER — Emergency Department (HOSPITAL_COMMUNITY): Payer: BC Managed Care – PPO

## 2013-06-01 ENCOUNTER — Observation Stay (HOSPITAL_COMMUNITY)
Admission: EM | Admit: 2013-06-01 | Discharge: 2013-06-02 | Disposition: A | Discharge status: Home / Self Care | Payer: BC Managed Care – PPO | Attending: Cardiology | Admitting: Cardiology

## 2013-06-01 DIAGNOSIS — Z792 Long term (current) use of antibiotics: Secondary | ICD-10-CM | POA: Insufficient documentation

## 2013-06-01 DIAGNOSIS — R079 Chest pain, unspecified: Secondary | ICD-10-CM

## 2013-06-01 DIAGNOSIS — E7800 Pure hypercholesterolemia, unspecified: Secondary | ICD-10-CM | POA: Insufficient documentation

## 2013-06-01 DIAGNOSIS — Z79899 Other long term (current) drug therapy: Secondary | ICD-10-CM | POA: Insufficient documentation

## 2013-06-01 DIAGNOSIS — I252 Old myocardial infarction: Secondary | ICD-10-CM | POA: Insufficient documentation

## 2013-06-01 DIAGNOSIS — I498 Other specified cardiac arrhythmias: Secondary | ICD-10-CM | POA: Insufficient documentation

## 2013-06-01 DIAGNOSIS — I209 Angina pectoris, unspecified: Secondary | ICD-10-CM | POA: Insufficient documentation

## 2013-06-01 DIAGNOSIS — M129 Arthropathy, unspecified: Secondary | ICD-10-CM | POA: Insufficient documentation

## 2013-06-01 DIAGNOSIS — Z7902 Long term (current) use of antithrombotics/antiplatelets: Secondary | ICD-10-CM | POA: Insufficient documentation

## 2013-06-01 DIAGNOSIS — Z7982 Long term (current) use of aspirin: Secondary | ICD-10-CM | POA: Insufficient documentation

## 2013-06-01 DIAGNOSIS — F17200 Nicotine dependence, unspecified, uncomplicated: Secondary | ICD-10-CM | POA: Insufficient documentation

## 2013-06-01 DIAGNOSIS — I10 Essential (primary) hypertension: Secondary | ICD-10-CM | POA: Insufficient documentation

## 2013-06-01 DIAGNOSIS — M1990 Unspecified osteoarthritis, unspecified site: Secondary | ICD-10-CM | POA: Insufficient documentation

## 2013-06-01 DIAGNOSIS — Z9861 Coronary angioplasty status: Secondary | ICD-10-CM | POA: Insufficient documentation

## 2013-06-01 DIAGNOSIS — R0789 Other chest pain: Principal | ICD-10-CM | POA: Insufficient documentation

## 2013-06-01 DIAGNOSIS — G8929 Other chronic pain: Secondary | ICD-10-CM | POA: Insufficient documentation

## 2013-06-01 DIAGNOSIS — I2510 Atherosclerotic heart disease of native coronary artery without angina pectoris: Secondary | ICD-10-CM | POA: Insufficient documentation

## 2013-06-01 DIAGNOSIS — R209 Unspecified disturbances of skin sensation: Secondary | ICD-10-CM | POA: Insufficient documentation

## 2013-06-01 DIAGNOSIS — Z8711 Personal history of peptic ulcer disease: Secondary | ICD-10-CM | POA: Insufficient documentation

## 2013-06-01 DIAGNOSIS — Z8701 Personal history of pneumonia (recurrent): Secondary | ICD-10-CM | POA: Insufficient documentation

## 2013-06-01 LAB — CBC WITH DIFFERENTIAL/PLATELET
Basophils Absolute: 0.1 10*3/uL (ref 0.0–0.1)
Basophils Relative: 1 % (ref 0–1)
Eosinophils Absolute: 0.7 10*3/uL (ref 0.0–0.7)
Eosinophils Relative: 8 % — ABNORMAL HIGH (ref 0–5)
HEMATOCRIT: 44.6 % (ref 39.0–52.0)
HEMOGLOBIN: 14.6 g/dL (ref 13.0–17.0)
Lymphocytes Relative: 35 % (ref 12–46)
Lymphs Abs: 3 10*3/uL (ref 0.7–4.0)
MCH: 23.5 pg — AB (ref 26.0–34.0)
MCHC: 32.7 g/dL (ref 30.0–36.0)
MCV: 71.9 fL — AB (ref 78.0–100.0)
Monocytes Absolute: 0.8 10*3/uL (ref 0.1–1.0)
Monocytes Relative: 10 % (ref 3–12)
Neutro Abs: 4.1 10*3/uL (ref 1.7–7.7)
Neutrophils Relative %: 47 % (ref 43–77)
Platelets: 315 10*3/uL (ref 150–400)
RBC: 6.2 MIL/uL — ABNORMAL HIGH (ref 4.22–5.81)
RDW: 15.4 % (ref 11.5–15.5)
WBC: 8.7 10*3/uL (ref 4.0–10.5)

## 2013-06-01 LAB — BASIC METABOLIC PANEL
BUN: 10 mg/dL (ref 6–23)
CHLORIDE: 102 meq/L (ref 96–112)
CO2: 24 meq/L (ref 19–32)
Calcium: 9.4 mg/dL (ref 8.4–10.5)
Creatinine, Ser: 1.17 mg/dL (ref 0.50–1.35)
GFR calc Af Amer: 77 mL/min — ABNORMAL LOW (ref >90)
GFR, EST NON AFRICAN AMERICAN: 66 mL/min — AB (ref >90)
GLUCOSE: 76 mg/dL (ref 70–99)
POTASSIUM: 4.1 meq/L (ref 3.7–5.3)
SODIUM: 141 meq/L (ref 137–147)

## 2013-06-01 LAB — TROPONIN I: Troponin I: <0.3 ng/mL (ref <0.30)

## 2013-06-01 LAB — POCT I-STAT TROPONIN I: Troponin i, poc: 0 ng/mL (ref 0.00–0.08)

## 2013-06-01 MED ORDER — AMLODIPINE BESYLATE 5 MG PO TABS
5.0000 mg | ORAL_TABLET | Freq: Every day | ORAL | Status: DC
  Administered 2013-06-02: 5 mg via ORAL
  Filled 2013-06-01: qty 1

## 2013-06-01 MED ORDER — CLOPIDOGREL BISULFATE 75 MG PO TABS
75.0000 mg | ORAL_TABLET | Freq: Every day | ORAL | Status: DC
  Administered 2013-06-02: 75 mg via ORAL
  Filled 2013-06-01: qty 1

## 2013-06-01 MED ORDER — METOPROLOL TARTRATE 12.5 MG HALF TABLET
12.5000 mg | ORAL_TABLET | Freq: Two times a day (BID) | ORAL | Status: DC
  Administered 2013-06-01 – 2013-06-02 (×2): 12.5 mg via ORAL
  Filled 2013-06-01 (×4): qty 1

## 2013-06-01 MED ORDER — SODIUM CHLORIDE 0.9 % IJ SOLN: 3.0000 mL | INTRAMUSCULAR | Status: DC | PRN

## 2013-06-01 MED ORDER — ASPIRIN EC 81 MG PO TBEC
81.0000 mg | DELAYED_RELEASE_TABLET | Freq: Every day | ORAL | Status: DC
  Administered 2013-06-02: 81 mg via ORAL
  Filled 2013-06-01: qty 1

## 2013-06-01 MED ORDER — ACETAMINOPHEN 325 MG PO TABS: 650.0000 mg | ORAL_TABLET | ORAL | Status: DC | PRN

## 2013-06-01 MED ORDER — HEPARIN (PORCINE) IN NACL 100-0.45 UNIT/ML-% IJ SOLN
900.0000 [IU]/h | INTRAMUSCULAR | Status: DC
  Administered 2013-06-01: 850 [IU]/h via INTRAVENOUS
  Filled 2013-06-01 (×3): qty 250

## 2013-06-01 MED ORDER — SODIUM CHLORIDE 0.9 % IV SOLN: 250.0000 mL | INTRAVENOUS | Status: DC | PRN

## 2013-06-01 MED ORDER — NITROGLYCERIN 0.4 MG SL SUBL: 0.4000 mg | SUBLINGUAL_TABLET | SUBLINGUAL | Status: DC | PRN

## 2013-06-01 MED ORDER — HEPARIN BOLUS VIA INFUSION
4000.0000 [IU] | Freq: Once | INTRAVENOUS | Status: AC
  Administered 2013-06-01: 4000 [IU] via INTRAVENOUS
  Filled 2013-06-01: qty 4000

## 2013-06-01 MED ORDER — ASPIRIN 81 MG PO CHEW
324.0000 mg | CHEWABLE_TABLET | Freq: Once | ORAL | Status: AC
  Administered 2013-06-01: 324 mg via ORAL
  Filled 2013-06-01: qty 4

## 2013-06-01 MED ORDER — SODIUM CHLORIDE 0.9 % IJ SOLN: 3.0000 mL | Freq: Two times a day (BID) | INTRAMUSCULAR | Status: DC

## 2013-06-01 MED ORDER — ONDANSETRON HCL 4 MG/2ML IJ SOLN: 4.0000 mg | Freq: Four times a day (QID) | INTRAMUSCULAR | Status: DC | PRN

## 2013-06-01 MED ORDER — ATORVASTATIN CALCIUM 40 MG PO TABS
40.0000 mg | ORAL_TABLET | Freq: Every day | ORAL | Status: DC
  Administered 2013-06-01: 40 mg via ORAL
  Filled 2013-06-01 (×3): qty 1

## 2013-06-01 NOTE — Progress Notes (Signed)
ANTICOAGULATION CONSULT NOTE - Initial Consult  Pharmacy Consult for Heparin Indication: chest pain/ACS  No Known Allergies  Patient Measurements: 70.8 kg 71" Heparin Dosing Weight: 70.8 kg  Vital Signs: Temp: 98.6 F (37 C) (01/18 1344) Temp src: Oral (01/18 1344) BP: 139/70 mmHg (01/18 1745) Pulse Rate: 50 (01/18 1745)  Labs:  Recent Labs  06/01/13 1349  HGB 14.6  HCT 44.6  PLT 315  CREATININE 1.17    The CrCl is unknown because both a height and weight (above a minimum accepted value) are required for this calculation.   Medical History: Past Medical History  Diagnosis Date  . Coronary artery disease   . Hypertension   . High cholesterol   . Arthritis   . Chronic lower back pain   . Angina   . Peptic ulcer disease   . DJD (degenerative joint disease)   . SVT (supraventricular tachycardia)   . Sinus bradycardia 09/28/11  . 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 MI (myocardial infarction) 2011  . Pneumonia     "when I was younger"  . Walking pneumonia ~ 2009    Medications:  Prescriptions prior to admission  Medication Sig Dispense Refill  . amLODipine (NORVASC) 5 MG tablet Take 5 mg by mouth daily.      Marland Kitchen aspirin EC 81 MG tablet Take 81 mg by mouth daily.      Marland Kitchen atorvastatin (LIPITOR) 40 MG tablet Take 40 mg by mouth daily at 6 PM.       . clopidogrel (PLAVIX) 75 MG tablet Take 75 mg by mouth daily.      . nitroGLYCERIN (NITROSTAT) 0.4 MG SL tablet Place 0.4 mg under the tongue every 5 (five) minutes as needed for chest pain.        Assessment: 63 y/o male with a history of CAD who presented to the ED with chest pain. Pharmacy consulted to begin heparin for chest pain, r/o MI. Plan is for nuclear stress test in the morning. No bleeding noted, CBC is normal at baseline.  Goal of Therapy:  Heparin level 0.3-0.7 units/ml Monitor platelets by anticoagulation protocol: Yes   Plan:  - Heparin 4000 unit IV bolus then infuse at 850 units/hr - 6 hr heparin level -  Daily heparin level and CBC - Monitor for signs/symptoms of bleeding  [facility], [place].D., BCPS Clinical Pharmacist Pager: [phone] 06/01/2013 6:25 PM

## 2013-06-01 NOTE — ED Notes (Signed)
Pt presents to department for evaluation of L sided chest pain radiating to L arm. Onset yesterday after taking new medication (Isosorbide Mononitrate). 5/10 sharp intermittent chest pain upon arrival to ED. Respirations unlabored. Pt is alert and oriented x4.

## 2013-06-01 NOTE — H&P (Signed)
[name] is an 63 y.o. male.   Chief Complaint: Chest pain HPI: 63 year old male with CAD, Inferior wall MI and RCA stent in 2008, hypertension, hypercholesterolemia, tobacco use disorder, has left sided chest pain radiating to left arm without shortness of breath or sweating. EKG shows Sinus rhythm with LVH, old inferior MI and lateral ischemia.  Past Medical History  Diagnosis Date  . Coronary artery disease   . Hypertension   . High cholesterol   . Arthritis   . Chronic lower back pain   . Angina   . Peptic ulcer disease   . DJD (degenerative joint disease)   . SVT (supraventricular tachycardia)   . Sinus bradycardia 09/28/11  . 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 MI (myocardial infarction) 2011  . Pneumonia     "when I was younger"  . Walking pneumonia ~ 2009      Past Surgical History  Procedure Laterality Date  . Colectomy  1990's    "ulcer burst; took out part of my colon"  . Coronary angioplasty with stent placement  08/2006    "1"  . Coronary angioplasty with stent placement  2011    "1"    No family history on file. Social History:  reports that he has been smoking Cigarettes.  He has a 26 pack-year smoking history. He has never used smokeless tobacco. He reports that he drinks about 7.2 ounces of alcohol per week. He reports that he uses illicit drugs (Marijuana and Cocaine).  Allergies: No Known Allergies   (Not in a hospital admission)  Results for orders placed during the hospital encounter of 06/01/13 (from the past 48 hour(s))  CBC WITH DIFFERENTIAL     Status: Abnormal   Collection Time    06/01/13  1:49 PM      Result Value Range   WBC 8.7  4.0 - 10.5 K/uL   RBC 6.20 (*) 4.22 - 5.81 MIL/uL   Hemoglobin 14.6  13.0 - 17.0 g/dL   HCT 44.6  39.0 - 52.0 %   MCV 71.9 (*) 78.0 - 100.0 fL   MCH 23.5 (*) 26.0 - 34.0 pg   MCHC 32.7  30.0 - 36.0 g/dL   RDW 15.4  11.5 - 15.5 %   Platelets 315  150 - 400 K/uL   Neutrophils Relative % 47  43 - 77 %   Neutro Abs 4.1   1.7 - 7.7 K/uL   Lymphocytes Relative 35  12 - 46 %   Lymphs Abs 3.0  0.7 - 4.0 K/uL   Monocytes Relative 10  3 - 12 %   Monocytes Absolute 0.8  0.1 - 1.0 K/uL   Eosinophils Relative 8 (*) 0 - 5 %   Eosinophils Absolute 0.7  0.0 - 0.7 K/uL   Basophils Relative 1  0 - 1 %   Basophils Absolute 0.1  0.0 - 0.1 K/uL  BASIC METABOLIC PANEL     Status: Abnormal   Collection Time    06/01/13  1:49 PM      Result Value Range   Sodium 141  137 - 147 mEq/L   Potassium 4.1  3.7 - 5.3 mEq/L   Chloride 102  96 - 112 mEq/L   CO2 24  19 - 32 mEq/L   Glucose, Bld 76  70 - 99 mg/dL   BUN 10  6 - 23 mg/dL   Creatinine, Ser 1.17  0.50 - 1.35 mg/dL   Calcium 9.4  8.4 - 10.5  mg/dL   GFR calc non Af Amer 66 (*) >90 mL/min   GFR calc Af Amer 77 (*) >90 mL/min   Comment: (NOTE)     The eGFR has been calculated using the CKD EPI equation.     This calculation has not been validated in all clinical situations.     eGFR's persistently <90 mL/min signify possible Chronic Kidney     Disease.  POCT I-STAT TROPONIN I     Status: None   Collection Time    06/01/13  3:15 PM      Result Value Range   Troponin i, poc 0.00  0.00 - 0.08 ng/mL   Comment 3            Comment: Due to the release kinetics of cTnI,     a negative result within the first hours     of the onset of symptoms does not rule out     myocardial infarction with certainty.     If myocardial infarction is still suspected,     repeat the test at appropriate intervals.   Dg Chest 2 View  06/01/2013   CLINICAL DATA:  History of previous MI and cardiac dysrhythmia and long-term history of smoking now with left-sided chest discomfort  EXAM: CHEST  2 VIEW  COMPARISON:  Portable chest x-ray of Sep 28, 2011  FINDINGS: The lungs are mildly hyperinflated with hemidiaphragm flattening. The cardiopericardial silhouette is normal in size. The mediastinum is normal in width. There is no pleural effusion or pneumothorax. There is mild tortuosity of the  descending thoracic aorta. The observed portions of the bony thorax appear normal. .  IMPRESSION: 1. There is mild hyperinflation which may reflect COPD. There is no focal pneumonia. 2. There is no evidence of CHF. There is no pleural or pericardial effusion and no pneumothorax.   Electronically Signed   By: David  Martinique   On: 06/01/2013 15:00    ROS Constitutional: Negative for fever, chills, weight loss and malaise/fatigue.  Eyes: Negative for blurred vision, double vision and photophobia.  Respiratory: Positive for shortness of breath. Negative for cough, hemoptysis, sputum production and wheezing.  Cardiovascular: Positive for chest pain and palpitations. Negative for orthopnea, claudication, leg swelling and PND.  Gastrointestinal: Positive for nausea. Negative for vomiting and abdominal pain.  Neurological: Positive for dizziness. Negative for headaches  Blood pressure 129/66, pulse 53, temperature 98.6 F (37 C), temperature source Oral, resp. rate 16, SpO2 99.00%.  Physical Exam  Constitutional: He is oriented to person, place, and time. He appears well-developed and well-nourished.  HENT: Head: Normocephalic and atraumatic. Brown eyes, conjunctivae are normal. Pupils are equal, round, and reactive to light. No scleral icterus.  Neck: Normal range of motion. Neck supple. No JVD present. No tracheal deviation present. No thyromegaly present.  Cardiovascular: Normal rate and regular rhythm. Exam reveals no friction rub. II/VI systolic murmur.  Respiratory: Breath sounds normal. No respiratory distress. He has no wheezes. He has no rales.  GI: Soft. Bowel sounds are normal. He exhibits no distension and no mass. There is no tenderness. There is no rebound and no guarding.  Musculoskeletal: Normal range of motion. He exhibits no edema and no tenderness.  Lymphadenopathy: He has no cervical adenopathy.  Neurological: He is alert and oriented to person, place, and time. Moves all 4  extremities.  Assessment/Plan Acute coronary syndrome rule out MI  Coronary artery disease  History of inferior wall myocardial infarction  Status post PTCA stenting to distal  RCA   Hypertension  Hypercholesteremia  History of SVT  Tobacco abuse  History of peptic ulcer disease  History of cocaine and marijuana abuse  Degenerative joint disease  Sinus bradycardia   Plan Place in observation. Nuclear stress test in AM. Very small dose B-blocker if tolerated.  Neville Walston S 06/01/2013, 5:00 PM

## 2013-06-01 NOTE — ED Provider Notes (Signed)
CSN: [id number]     Arrival date & time 06/01/13  1335 History   First MD Initiated Contact with Patient 06/01/13 1608     Chief Complaint  Patient presents with  . Chest Pain   (Consider location/radiation/quality/duration/timing/severity/associated sxs/prior Treatment) HPI Comments: Patient with a history of CAD, MI s/p stents presents today with a chief complaint of chest pain.  He reports that the pain has been occurring intermittently since yesterday.  He describes the pain as a "sharp" pain.  He reports that the pain comes on both at rest and with exertion.  He states that the pain typically lasts a couple of minutes and then resolves without intervention.  Pain located left anterior chest and radiates down the left arm.  He reports associated numbness/tingling of the left arm.  He denies any chest pain at this time.  He denies any SOB, nausea, vomiting, diaphoresis, dizziness, or lightheadedness.  Denies cough, fever, or chills.  He reports that he took Imdur for the pain earlier today and felt that it made the pain worse.  He is followed by Dr. Terrence Dupont with Cardiology.    The history is provided by the patient.    Past Medical History  Diagnosis Date  . Coronary artery disease   . Hypertension   . High cholesterol   . Arthritis   . Chronic lower back pain   . Angina   . Peptic ulcer disease   . DJD (degenerative joint disease)   . SVT (supraventricular tachycardia)   . Sinus bradycardia 09/28/11  . 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 MI (myocardial infarction) 2011  . Pneumonia     "when I was younger"  . Walking pneumonia ~ 2009   Past Surgical History  Procedure Laterality Date  . Colectomy  1990's    "ulcer burst; took out part of my colon"  . Coronary angioplasty with stent placement  08/2006    "1"  . Coronary angioplasty with stent placement  2011    "1"   No family history on file. History  Substance Use Topics  . Smoking status: Current Every Day Smoker -- 0.50 packs/day  for 52 years    Types: Cigarettes  . Smokeless tobacco: Never Used  . Alcohol Use: 7.2 oz/week    12 Cans of beer per week    Review of Systems  All other systems reviewed and are negative.    Allergies  Review of patient's allergies indicates no known allergies.  Home Medications   Current Outpatient Rx  Name  Route  Sig  Dispense  Refill  . amLODipine (NORVASC) 5 MG tablet   Oral   Take 5 mg by mouth daily.         Marland Kitchen aspirin 81 MG chewable tablet   Oral   Chew 81 mg by mouth daily.         Marland Kitchen atorvastatin (LIPITOR) 40 MG tablet   Oral   Take 40 mg by mouth daily.         Marland Kitchen azithromycin (ZITHROMAX) 500 MG tablet   Oral   Take 1 tablet (500 mg total) by mouth daily.   5 tablet   0   . azithromycin (ZITHROMAX) 500 MG tablet   Oral   Take 1 tablet (500 mg total) by mouth daily.   5 tablet   0   . benzonatate (TESSALON) 100 MG capsule   Oral   Take 1 capsule (100 mg total) by mouth 2 (two) times daily as  needed for cough.   20 capsule   0   . clopidogrel (PLAVIX) 75 MG tablet   Oral   Take 75 mg by mouth daily.         Marland Kitchen guaiFENesin-dextromethorphan (ROBITUSSIN DM) 100-10 MG/5ML syrup   Oral   Take 5 mLs by mouth 3 (three) times daily as needed for cough.   118 mL   0   . EXPIRED: nitroGLYCERIN (NITROSTAT) 0.4 MG SL tablet   Sublingual   Place 1 tablet (0.4 mg total) under the tongue every 5 (five) minutes x 3 doses as needed for chest pain.   25 tablet   3   . trimethoprim-polymyxin b (POLYTRIM) ophthalmic solution   Right Eye   Place 1 drop into the right eye every 4 (four) hours. Treat for 5 days   10 mL   0    BP 129/66  Pulse 53  Temp(Src) 98.6 F (37 C) (Oral)  Resp 16  SpO2 99% Physical Exam  Nursing note and vitals reviewed. Constitutional: He appears well-developed and well-nourished.  HENT:  Head: Normocephalic and atraumatic.  Mouth/Throat: Oropharynx is clear and moist.  Neck: Normal range of motion. Neck supple.   Cardiovascular: Normal rate, regular rhythm, normal heart sounds and intact distal pulses.   Pulmonary/Chest: Effort normal and breath sounds normal. No respiratory distress. He has no wheezes. He has no rales.  Abdominal: Soft. Bowel sounds are normal. He exhibits no distension. There is no tenderness. There is no rebound and no guarding.  Musculoskeletal: Normal range of motion.  No lower extremity edema bilaterally  Neurological: He is alert.  Skin: Skin is warm and dry.  Psychiatric: He has a normal mood and affect.    ED Course  Procedures (including critical care time) Labs Review Labs Reviewed  CBC WITH DIFFERENTIAL - Abnormal; Notable for the following:    RBC 6.20 (*)    MCV 71.9 (*)    MCH 23.5 (*)    Eosinophils Relative 8 (*)    All other components within normal limits  BASIC METABOLIC PANEL  POCT I-STAT TROPONIN I   Imaging Review Dg Chest 2 View  06/01/2013   CLINICAL DATA:  History of previous MI and cardiac dysrhythmia and long-term history of smoking now with left-sided chest discomfort  EXAM: CHEST  2 VIEW  COMPARISON:  Portable chest x-ray of Sep 28, 2011  FINDINGS: The lungs are mildly hyperinflated with hemidiaphragm flattening. The cardiopericardial silhouette is normal in size. The mediastinum is normal in width. There is no pleural effusion or pneumothorax. There is mild tortuosity of the descending thoracic aorta. The observed portions of the bony thorax appear normal. .  IMPRESSION: 1. There is mild hyperinflation which may reflect COPD. There is no focal pneumonia. 2. There is no evidence of CHF. There is no pleural or pericardial effusion and no pneumothorax.   Electronically Signed   By: David  Martinique   On: 06/01/2013 15:00    EKG Interpretation    Date/Time:  Sunday June 01 2013 13:40:13 EST Ventricular Rate:  51 PR Interval:  162 QRS Duration: 92 QT Interval:  406 QTC Calculation: 374 R Axis:   83 Text Interpretation:  Sinus bradycardia  Possible Left atrial enlargement Cannot rule out Inferior infarct , age undetermined T wave abnormality, consider lateral ischemia Abnormal ECG Sinus rhythm ST-t wave abnormality No significant change since last tracing Abnormal ekg Confirmed by Carmin Muskrat  MD [phone]) on 06/01/2013 1:49:19 PM  4:47 PM Discussed with Dr. Doylene Canard who is covering for Dr. Terrence Dupont.  He states that he will come see patient in the ED and admit. MDM  No diagnosis found. Patient with a history of MI s/p cardiac stents presents today with intermittent chest pain radiating down his left arm.  Pain comes on both at rest and with exertion.  No active chest pain during ED course.  EKG showing no significant change from previously.  Initial troponin negative.  CXR negative.  Patient discussed with Dr. Doylene Canard who evaluated the patient in the ED and agreed to admit the patient    Hyman Bible, PA-C 06/01/13 2010

## 2013-06-01 NOTE — ED Provider Notes (Signed)
Medical screening examination/treatment/procedure(s) were performed by non-physician practitioner and as supervising physician I was immediately available for consultation/collaboration.  EKG Interpretation    Date/Time:  Sunday June 01 2013 13:40:13 EST Ventricular Rate:  51 PR Interval:  162 QRS Duration: 92 QT Interval:  406 QTC Calculation: 374 R Axis:   83 Text Interpretation:  Sinus bradycardia Possible Left atrial enlargement Cannot rule out Inferior infarct , age undetermined T wave abnormality, consider lateral ischemia Abnormal ECG Sinus rhythm ST-t wave abnormality No significant change since last tracing Abnormal ekg Confirmed by Carmin Muskrat  MD [phone]) on 06/01/2013 1:49:19 PM Also confirmed by Emmerson Shuffield  MD, Jazminn Pomales ([id number])  on 06/01/2013 4:35:31 PM              Malvin Johns, MD 06/01/13 2021

## 2013-06-02 ENCOUNTER — Observation Stay (HOSPITAL_COMMUNITY): Payer: BC Managed Care – PPO

## 2013-06-02 LAB — CBC
HCT: 41.1 % (ref 39.0–52.0)
HEMATOCRIT: 39.4 % (ref 39.0–52.0)
HEMOGLOBIN: 13.7 g/dL (ref 13.0–17.0)
Hemoglobin: 13.2 g/dL (ref 13.0–17.0)
MCH: 24 pg — AB (ref 26.0–34.0)
MCH: 24 pg — AB (ref 26.0–34.0)
MCHC: 33.5 g/dL (ref 30.0–36.0)
MCHC: 33.3 g/dL (ref 30.0–36.0)
MCV: 71.5 fL — ABNORMAL LOW (ref 78.0–100.0)
MCV: 71.9 fL — AB (ref 78.0–100.0)
PLATELETS: 287 10*3/uL (ref 150–400)
PLATELETS: 282 10*3/uL (ref 150–400)
RBC: 5.72 MIL/uL (ref 4.22–5.81)
RBC: 5.51 MIL/uL (ref 4.22–5.81)
RDW: 15.1 % (ref 11.5–15.5)
RDW: 15.2 % (ref 11.5–15.5)
WBC: 8.2 10*3/uL (ref 4.0–10.5)
WBC: 8.2 10*3/uL (ref 4.0–10.5)

## 2013-06-02 LAB — BASIC METABOLIC PANEL
BUN: 12 mg/dL (ref 6–23)
CHLORIDE: 105 meq/L (ref 96–112)
CO2: 23 meq/L (ref 19–32)
Calcium: 8.8 mg/dL (ref 8.4–10.5)
Creatinine, Ser: 1.03 mg/dL (ref 0.50–1.35)
GFR calc Af Amer: 89 mL/min — ABNORMAL LOW (ref >90)
GFR calc non Af Amer: 77 mL/min — ABNORMAL LOW (ref >90)
Glucose, Bld: 135 mg/dL — ABNORMAL HIGH (ref 70–99)
Potassium: 3.8 mEq/L (ref 3.7–5.3)
Sodium: 141 mEq/L (ref 137–147)

## 2013-06-02 LAB — LIPID PANEL
CHOL/HDL RATIO: 2.3 ratio
CHOLESTEROL: 134 mg/dL (ref 0–200)
HDL: 58 mg/dL (ref >39)
LDL Cholesterol: 64 mg/dL (ref 0–99)
Triglycerides: 60 mg/dL (ref <150)
VLDL: 12 mg/dL (ref 0–40)

## 2013-06-02 LAB — PROTIME-INR
INR: 1.04 (ref 0.00–1.49)
PROTHROMBIN TIME: 13.4 s (ref 11.6–15.2)

## 2013-06-02 LAB — TROPONIN I
Troponin I: <0.3 ng/mL (ref <0.30)
Troponin I: <0.3 ng/mL (ref <0.30)

## 2013-06-02 LAB — HEPARIN LEVEL (UNFRACTIONATED)
Heparin Unfractionated: 0.32 IU/mL (ref 0.30–0.70)
Heparin Unfractionated: 0.27 IU/mL — ABNORMAL LOW (ref 0.30–0.70)

## 2013-06-02 MED ORDER — TECHNETIUM TC 99M SESTAMIBI GENERIC - CARDIOLITE
30.0000 | Freq: Once | INTRAVENOUS | Status: AC | PRN
  Administered 2013-06-02: 30 via INTRAVENOUS

## 2013-06-02 MED ORDER — TECHNETIUM TC 99M SESTAMIBI GENERIC - CARDIOLITE
10.0000 | Freq: Once | INTRAVENOUS | Status: AC | PRN
  Administered 2013-06-02: 10 via INTRAVENOUS

## 2013-06-02 MED ORDER — HEPARIN (PORCINE) IN NACL 100-0.45 UNIT/ML-% IJ SOLN
1050.0000 [IU]/h | INTRAMUSCULAR | Status: DC
  Filled 2013-06-02: qty 250

## 2013-06-02 MED ORDER — REGADENOSON 0.4 MG/5ML IV SOLN
INTRAVENOUS | Status: AC
  Administered 2013-06-02: 0.4 mg via INTRAVENOUS
  Filled 2013-06-02: qty 5

## 2013-06-02 MED ORDER — REGADENOSON 0.4 MG/5ML IV SOLN
0.4000 mg | Freq: Once | INTRAVENOUS | Status: AC
  Administered 2013-06-02: 0.4 mg via INTRAVENOUS
  Filled 2013-06-02: qty 5

## 2013-06-02 NOTE — Discharge Instructions (Signed)
Chest Pain (Nonspecific) °It is often hard to give a specific diagnosis for the cause of chest pain. There is always a chance that your pain could be related to something serious, such as a heart attack or a blood clot in the lungs. You need to follow up with your caregiver for further evaluation. °CAUSES  °· Heartburn. °· Pneumonia or bronchitis. °· Anxiety or stress. °· Inflammation around your heart (pericarditis) or lung (pleuritis or pleurisy). °· A blood clot in the lung. °· A collapsed lung (pneumothorax). It can develop suddenly on its own (spontaneous pneumothorax) or from injury (trauma) to the chest. °· Shingles infection (herpes zoster virus). °The chest wall is composed of bones, muscles, and cartilage. Any of these can be the source of the pain. °· The bones can be bruised by injury. °· The muscles or cartilage can be strained by coughing or overwork. °· The cartilage can be affected by inflammation and become sore (costochondritis). °DIAGNOSIS  °Lab tests or other studies, such as X-rays, electrocardiography, stress testing, or cardiac imaging, may be needed to find the cause of your pain.  °TREATMENT  °· Treatment depends on what may be causing your chest pain. Treatment may include: °· Acid blockers for heartburn. °· Anti-inflammatory medicine. °· Pain medicine for inflammatory conditions. °· Antibiotics if an infection is present. °· You may be advised to change lifestyle habits. This includes stopping smoking and avoiding alcohol, caffeine, and chocolate. °· You may be advised to keep your head raised (elevated) when sleeping. This reduces the chance of acid going backward from your stomach into your esophagus. °· Most of the time, nonspecific chest pain will improve within 2 to 3 days with rest and mild pain medicine. °HOME CARE INSTRUCTIONS  °· If antibiotics were prescribed, take your antibiotics as directed. Finish them even if you start to feel better. °· For the next few days, avoid physical  activities that bring on chest pain. Continue physical activities as directed. °· Do not smoke. °· Avoid drinking alcohol. °· Only take over-the-counter or prescription medicine for pain, discomfort, or fever as directed by your caregiver. °· Follow your caregiver's suggestions for further testing if your chest pain does not go away. °· Keep any follow-up appointments you made. If you do not go to an appointment, you could develop lasting (chronic) problems with pain. If there is any problem keeping an appointment, you must call to reschedule. °SEEK MEDICAL CARE IF:  °· You think you are having problems from the medicine you are taking. Read your medicine instructions carefully. °· Your chest pain does not go away, even after treatment. °· You develop a rash with blisters on your chest. °SEEK IMMEDIATE MEDICAL CARE IF:  °· You have increased chest pain or pain that spreads to your arm, neck, jaw, back, or abdomen. °· You develop shortness of breath, an increasing cough, or you are coughing up blood. °· You have severe back or abdominal pain, feel nauseous, or vomit. °· You develop severe weakness, fainting, or chills. °· You have a fever. °THIS IS AN EMERGENCY. Do not wait to see if the pain will go away. Get medical help at once. Call your local emergency services (911 in [place] Do not drive yourself to the hospital. °MAKE SURE YOU:  °· Understand these instructions. °· Will watch your condition. °· Will get help right away if you are not doing well or get worse. °Document Released: 02/08/2005 Document Revised: 07/24/2011 Document Reviewed: 12/05/2007 °[employer]® Patient Information ©2014 [employer]   [employer]. ° °

## 2013-06-02 NOTE — Progress Notes (Signed)
ANTICOAGULATION CONSULT NOTE - Follow Up Consult  Pharmacy Consult for Heparin Indication: chest pain/ACS  No Known Allergies  Patient Measurements: Height: 6\' 1"  (185.4 cm) Weight: 153 lb 7 oz (69.6 kg) IBW/kg (Calculated) : 79.9 Heparin Dosing Weight: 70kg  Vital Signs: Temp: 98.5 F (36.9 C) (01/19 0630) Temp src: Oral (01/19 0630) BP: 118/64 mmHg (01/19 0856) Pulse Rate: 52 (01/19 0630)  Labs:  Recent Labs  06/01/13 1349 06/01/13 2020 06/02/13 0050 06/02/13 0055 06/02/13 0659  HGB 14.6  --  13.2  --  13.7  HCT 44.6  --  39.4  --  41.1  PLT 315  --  287  --  282  LABPROT  --   --  13.4  --   --   INR  --   --  1.04  --   --   HEPARINUNFRC  --   --  0.32  --  0.27*  CREATININE 1.17  --  1.03  --   --   TROPONINI  --  <0.30  --  <0.30 <0.30    Estimated Creatinine Clearance: 75.1 ml/min (by C-G formula based on Cr of 1.03).   Medications:  Heparin @ 900 units/hr  Assessment: 62yom continues on heparin for chest pain. Heparin level is slightly below goal. Troponins are negative. He had a stress test this morning - results pending.  Goal of Therapy:  Heparin level 0.3-0.7 units/ml Monitor platelets by anticoagulation protocol: Yes   Plan:  1) Increase heparin to 1050 units/hr 2) Follow up stress test results  Deboraha Sprang 06/02/2013,10:41 AM

## 2013-06-02 NOTE — Progress Notes (Signed)
Subjective:  Patient denies any further chest pain. Tolerated nuclear stress test okay  Objective:  Vital Signs in the last 24 hours: Temp:  [98.4 F (36.9 C)-98.6 F (37 C)] 98.5 F (36.9 C) (01/19 0630) Pulse Rate:  [49-71] 52 (01/19 0630) Resp:  [14-20] 16 (01/19 0630) BP: (103-148)/(55-82) 118/64 mmHg (01/19 0856) SpO2:  [96 %-100 %] 96 % (01/19 0630) Weight:  [69.6 kg (153 lb 7 oz)] 69.6 kg (153 lb 7 oz) (01/18 1829)  Intake/Output from previous day: 01/18 0701 - 01/19 0700 In: -  Out: 900 [Urine:900] Intake/Output from this shift:    Physical Exam: Neck: no adenopathy, no carotid bruit, no JVD and supple, symmetrical, trachea midline Lungs: clear to auscultation bilaterally Heart: regular rate and rhythm, S1, S2 normal, no murmur, click, rub or gallop Abdomen: soft, non-tender; bowel sounds normal; no masses,  no organomegaly Extremities: extremities normal, atraumatic, no cyanosis or edema  Lab Results:  Recent Labs  06/02/13 0050 06/02/13 0659  WBC 8.2 8.2  HGB 13.2 13.7  PLT 287 282    Recent Labs  06/01/13 1349 06/02/13 0050  NA 141 141  K 4.1 3.8  CL 102 105  CO2 24 23  GLUCOSE 76 135*  BUN 10 12  CREATININE 1.17 1.03    Recent Labs  06/02/13 0055 06/02/13 0659  TROPONINI <0.30 <0.30   Hepatic Function Panel No results found for this basename: PROT, ALBUMIN, AST, ALT, ALKPHOS, BILITOT, BILIDIR, IBILI,  in the last 72 hours  Recent Labs  06/02/13 0050  CHOL 134   No results found for this basename: PROTIME,  in the last 72 hours  Imaging: Imaging results have been reviewed and Dg Chest 2 View  06/01/2013   CLINICAL DATA:  History of previous MI and cardiac dysrhythmia and long-term history of smoking now with left-sided chest discomfort  EXAM: CHEST  2 VIEW  COMPARISON:  Portable chest x-ray of Sep 28, 2011  FINDINGS: The lungs are mildly hyperinflated with hemidiaphragm flattening. The cardiopericardial silhouette is normal in size.  The mediastinum is normal in width. There is no pleural effusion or pneumothorax. There is mild tortuosity of the descending thoracic aorta. The observed portions of the bony thorax appear normal. .  IMPRESSION: 1. There is mild hyperinflation which may reflect COPD. There is no focal pneumonia. 2. There is no evidence of CHF. There is no pleural or pericardial effusion and no pneumothorax.   Electronically Signed   By: David  Martinique   On: 06/01/2013 15:00   Nm Myocar Multi W/spect W/wall Motion / Ef  06/02/2013   CLINICAL DATA:  Chest pain. Coronary artery disease. Previous myocardial infarct and coronary stent placement.  EXAM: MYOCARDIAL IMAGING WITH SPECT (REST AND PHARMACOLOGIC-STRESS)  GATED LEFT VENTRICULAR WALL MOTION STUDY  LEFT VENTRICULAR EJECTION FRACTION  TECHNIQUE: Standard myocardial SPECT imaging was performed after resting intravenous injection of 10 mCi Tc-86m sestamibi. Subsequently, intravenous infusion of Lexiscan was performed under the supervision of the Cardiology staff. At peak effect of the drug, 30 mCi Tc-1m sestamibi was injected intravenously and standard myocardial SPECT imaging was performed. Quantitative gated imaging was also performed to evaluate left ventricular wall motion, and estimate left ventricular ejection fraction.  COMPARISON:  09/29/2011  FINDINGS: Exam quality is good. SPECT images show a large fixed inferior wall myocardial perfusion defect on both stress and rest images, consistent with myocardial scar. No reversible myocardial perfusion defects are seen to suggest presence of myocardial ischemia.  Gated left ventricular wall  motion study shows inferior wall hypokinesis.  Calculated left ventricular ejection fraction is 55%.  IMPRESSION: Large inferior wall myocardial scar. No evidence of pharmacologic induced myocardial ischemia.  Left ventricular inferior wall hypokinesis, with calculated ejection fraction of 55%.   Electronically Signed   By: Earle Gell M.D.    On: 06/02/2013 11:38    Cardiac Studies:  Assessment/Plan:  Status post atypical chest pain MI ruled out negative nuclear stress test Coronary artery disease  History of inferior wall myocardial infarction  Status post PTCA stenting to distal RCA  Hypertension  Hypercholesteremia  History of SVT  Tobacco abuse  History of peptic ulcer disease  History of cocaine and marijuana abuse  Degenerative joint disease  Sinus bradycardia  Plan DC home Discharge summary dictated  LOS: 1 day    [name] N 06/02/2013, 1:02 PM

## 2013-06-02 NOTE — Discharge Summary (Signed)
  Discharge summary dictated on 06/02/2013 dictation number is [phone]

## 2013-06-02 NOTE — Progress Notes (Signed)
ANTICOAGULATION CONSULT NOTE - Follow Up Consult  Pharmacy Consult for heparin Indication: chest pain/ACS  Labs:  Recent Labs  06/01/13 1349 06/01/13 2020 06/02/13 0050 06/02/13 0055  HGB 14.6  --  13.2  --   HCT 44.6  --  39.4  --   PLT 315  --  287  --   LABPROT  --   --  13.4  --   INR  --   --  1.04  --   HEPARINUNFRC  --   --  0.32  --   CREATININE 1.17  --  1.03  --   TROPONINI  --  <0.30  --  <0.30    Assessment: 63yo male therapeutic on heparin with initial dosing for CP though at very low end of goal.  Goal of Therapy:  Heparin level 0.3-0.7 units/ml   Plan:  Will increase heparin gtt slightly to 900 units/hr to help ensure level stays in range and check level with next troponin.  Wynona Neat, PharmD, BCPS  06/02/2013,1:53 AM

## 2013-06-03 NOTE — Discharge Summary (Signed)
[name], [name] NO.:  [id number]  MEDICAL RECORD NO.:  [id number]  LOCATION:  [id number]                        FACILITY:  [facility]  PHYSICIAN:  [name] N. [name], M.D. DATE OF BIRTH:  06/09/1950  DATE OF ADMISSION:  06/01/2013 DATE OF DISCHARGE:  06/02/2013                              DISCHARGE SUMMARY   ADMITTING DIAGNOSES: 1. Acute coronary syndrome, rule out myocardial infarction. 2. Coronary artery disease. 3. 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 4. Hypertension. 5. Hypercholesteremia. 6. History of supraventricular tachycardia. 7. Tobacco abuse. 8. History of peptic ulcer disease in the past. 9. 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 10.Degenerative joint disease. 11.Sinus bradycardia.  DISCHARGE DIAGNOSES: 1. Status post chest pain, myocardial infarction ruled out.  Negative     nuclear stress test. 2. Coronary artery disease, history of inferior wall myocardial     infarction status post percutaneous coronary intervention to right     coronary artery in the past. 3. Hypertension. 4. Hypercholesteremia. 5. History of supraventricular tachycardia. 6. Tobacco abuse. 7. History of peptic ulcer disease in the past. 8. History of cocaine and marijuana abuse. 9. Degenerative joint disease. 10.Sinus bradycardia.  DISCHARGE HOME MEDICATIONS:  Amlodipine 5 mg 1 tablet daily, aspirin 81 mg 1 tablet daily, atorvastatin 40 mg 1 tablet daily, clopidogrel 75 mg 1 tablet daily, Nitrostat sublingual use as directed.  DIET:  Low-salt, low-cholesterol.  ACTIVITY:  Increase activity as tolerated.  The patient has been advised for smoking cessation, diet, and exercise.  FOLLOWUP:  With me in 1 week.  CONDITION AT DISCHARGE:  Stable.  BRIEF HISTORY AND HOSPITAL COURSE:  [name] is a 63 year old male with past medical history  significant for coronary artery disease, history of inferior wall MI, status post PCI to RCA in the past, hypertension, hypercholesteremia, tobacco abuse, history of marijuana and cocaine abuse in the past.  He came to the ER complaining of left-sided chest pain radiating to left arm without shortness of breath or sweating.  EKG showed sinus rhythm with LVH, old inferior wall MI, and lateral ischemic changes.  PAST MEDICAL HISTORY:  As above.  PHYSICAL EXAMINATION:  GENERAL:  He was alert, awake, oriented x3. VITAL SIGNS:  His blood pressure was 129/66, pulse was 53.  He was afebrile. HEENT:  Conjunctivae was pink. NECK:  Supple.  No JVD.  No bruit. LUNGS:  Clear to auscultation without rhonchi or rales. CARDIOVASCULAR:  S1, S2 was normal.  There was no S3, gallop.  There was 2/6 systolic murmur. ABDOMEN:  Soft.  Bowel sounds were present, nontender. EXTREMITIES:  There is no clubbing, cyanosis, or edema.  LABORATORY DATA:  Sodium was 141, potassium 4.1, BUN 10, creatinine 1.17.  Three sets of troponin-I were negative.  Cholesterol was 134, triglycerides 60, HDL 58, LDL was 64, hemoglobin was 14.6, hematocrit 44.6, white count of 8.7.  Nuclear stress test showed large inferior wall myocardial scar.  No evidence of myocardial ischemia with EF of 55%.  BRIEF HOSPITAL COURSE:  The patient was  admitted to telemetry unit.  MI was ruled out by serial enzymes and EKG.  The patient subsequently underwent nuclear stress test as above.  The patient did not have any episodes of anginal chest pain during hospital stay.  The patient will be discharged home on above medications and will be followed up in my office in 1 week.     [name]. [name], M.D.     MNH/MEDQ  D:  06/02/2013  T:  06/03/2013  Job:  [id number]

## 2013-06-13 ENCOUNTER — Ambulatory Visit (HOSPITAL_COMMUNITY): Payer: BC Managed Care – PPO

## 2013-06-13 ENCOUNTER — Encounter (HOSPITAL_COMMUNITY): Payer: BC Managed Care – PPO

## 2014-04-23 ENCOUNTER — Encounter (HOSPITAL_COMMUNITY): Payer: Self-pay | Admitting: Cardiology

## 2014-07-24 ENCOUNTER — Encounter: Payer: Self-pay | Admitting: Gastroenterology

## 2015-01-03 ENCOUNTER — Encounter (HOSPITAL_COMMUNITY): Payer: Self-pay | Admitting: Emergency Medicine

## 2015-01-03 ENCOUNTER — Emergency Department (HOSPITAL_COMMUNITY)
Admission: EM | Admit: 2015-01-03 | Discharge: 2015-01-03 | Disposition: A | Discharge status: Home / Self Care | Payer: Self-pay | Attending: Emergency Medicine | Admitting: Emergency Medicine

## 2015-01-03 DIAGNOSIS — G8929 Other chronic pain: Secondary | ICD-10-CM | POA: Insufficient documentation

## 2015-01-03 DIAGNOSIS — I252 Old myocardial infarction: Secondary | ICD-10-CM | POA: Insufficient documentation

## 2015-01-03 DIAGNOSIS — K0889 Other specified disorders of teeth and supporting structures: Secondary | ICD-10-CM

## 2015-01-03 DIAGNOSIS — Z8711 Personal history of peptic ulcer disease: Secondary | ICD-10-CM | POA: Insufficient documentation

## 2015-01-03 DIAGNOSIS — E780 Pure hypercholesterolemia: Secondary | ICD-10-CM | POA: Insufficient documentation

## 2015-01-03 DIAGNOSIS — Z7982 Long term (current) use of aspirin: Secondary | ICD-10-CM | POA: Insufficient documentation

## 2015-01-03 DIAGNOSIS — Z720 Tobacco use: Secondary | ICD-10-CM | POA: Insufficient documentation

## 2015-01-03 DIAGNOSIS — I10 Essential (primary) hypertension: Secondary | ICD-10-CM | POA: Insufficient documentation

## 2015-01-03 DIAGNOSIS — Z7902 Long term (current) use of antithrombotics/antiplatelets: Secondary | ICD-10-CM | POA: Insufficient documentation

## 2015-01-03 DIAGNOSIS — Z9861 Coronary angioplasty status: Secondary | ICD-10-CM | POA: Insufficient documentation

## 2015-01-03 DIAGNOSIS — M1990 Unspecified osteoarthritis, unspecified site: Secondary | ICD-10-CM | POA: Insufficient documentation

## 2015-01-03 DIAGNOSIS — K088 Other specified disorders of teeth and supporting structures: Secondary | ICD-10-CM | POA: Insufficient documentation

## 2015-01-03 DIAGNOSIS — Z79899 Other long term (current) drug therapy: Secondary | ICD-10-CM | POA: Insufficient documentation

## 2015-01-03 DIAGNOSIS — I25119 Atherosclerotic heart disease of native coronary artery with unspecified angina pectoris: Secondary | ICD-10-CM | POA: Insufficient documentation

## 2015-01-03 DIAGNOSIS — Z8701 Personal history of pneumonia (recurrent): Secondary | ICD-10-CM | POA: Insufficient documentation

## 2015-01-03 MED ORDER — HYDROCODONE-ACETAMINOPHEN 5-325 MG PO TABS
1.0000 | ORAL_TABLET | Freq: Once | ORAL | Status: AC
  Administered 2015-01-03: 1 via ORAL
  Filled 2015-01-03: qty 1

## 2015-01-03 MED ORDER — HYDROCODONE-ACETAMINOPHEN 5-325 MG PO TABS: 2.0000 | ORAL_TABLET | ORAL | Status: DC | PRN

## 2015-01-03 MED ORDER — CLINDAMYCIN HCL 150 MG PO CAPS: 150.0000 mg | ORAL_CAPSULE | Freq: Four times a day (QID) | ORAL | Status: DC

## 2015-01-03 NOTE — Discharge Instructions (Signed)

## 2015-01-03 NOTE — ED Provider Notes (Signed)
CSN: [id number]     Arrival date & time 01/03/15  1351 History  This chart was scribed for non-physician practitioner, Alyse Low, PA-C, working with Dorie Rank, MD, by Stephania Fragmin, ED Scribe. This patient was seen in room WTR8/WTR8 and the patient's care was started at 3:37 PM.   Chief Complaint  Patient presents with  . Dental Pain  . Abscess   Patient is a 64 y.o. male presenting with tooth pain and abscess. The history is provided by the patient. No language interpreter was used.  Dental Pain Location:  Lower Lower teeth location:  21/LL 1st bicuspid and 20/LL 2nd bicuspid Severity:  Severe Onset quality:  Gradual Duration:  2 days Timing:  Constant Progression:  Worsening Chronicity:  New Relieved by:  None tried Worsened by:  Nothing tried Ineffective treatments:  None tried Associated symptoms: fever (subjective) and gum swelling   Abscess Associated symptoms: fever (subjective)     HPI Comments: [name] is a 64 y.o. male who presents to the Emergency Department complaining of gradual-onset, constant, severe, worsening left lower dental pain and infection that began 2 days ago. He endorses an associated subjective fever. Patient states he does not have a dentist. Patient has NKDA.    Past Medical History  Diagnosis Date  . Coronary artery disease   . Hypertension   . High cholesterol   . Arthritis   . Chronic lower back pain   . Angina   . Peptic ulcer disease   . DJD (degenerative joint disease)   . SVT (supraventricular tachycardia)   . Sinus bradycardia 09/28/11  . 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 MI (myocardial infarction) 2011  . Pneumonia     "when I was younger"  . Walking pneumonia ~ 2009   Past Surgical History  Procedure Laterality Date  . Colectomy  1990's    "ulcer burst; took out part of my colon"  . Coronary angioplasty with stent placement  08/2006    "1"  . Coronary angioplasty with stent placement  2011    "1"  . Left heart catheterization with  coronary angiogram N/A 09/29/2011    Procedure: LEFT HEART CATHETERIZATION WITH CORONARY ANGIOGRAM;  Surgeon: [name], MD;  Location: [facility] CATH LAB;  Service: Cardiovascular;  Laterality: N/A;   History reviewed. No pertinent family history. Social History  Substance Use Topics  . Smoking status: Current Every Day Smoker -- 0.50 packs/day for 52 years    Types: Cigarettes  . Smokeless tobacco: Never Used  . Alcohol Use: 7.2 oz/week    12 Cans of beer per week    Review of Systems  Constitutional: Positive for fever (subjective).  HENT: Positive for dental problem.   All other systems reviewed and are negative.     Allergies  Review of patient's allergies indicates no known allergies.  Home Medications   Prior to Admission medications   Medication Sig Start Date End Date Taking? Authorizing Provider  amLODipine (NORVASC) 5 MG tablet Take 5 mg by mouth daily. 11/19/12  Yes Historical Provider, MD  aspirin EC 81 MG tablet Take 81 mg by mouth daily.   Yes Historical Provider, MD  atorvastatin (LIPITOR) 40 MG tablet Take 40 mg by mouth daily at 6 PM.  10/09/12  Yes Historical Provider, MD  clopidogrel (PLAVIX) 75 MG tablet Take 75 mg by mouth daily. 11/19/12  Yes Historical Provider, MD  ibuprofen (ADVIL,MOTRIN) 200 MG tablet Take 400 mg by mouth every 6 (six) hours as needed  for moderate pain.   Yes Historical Provider, MD  nitroGLYCERIN (NITROSTAT) 0.4 MG SL tablet Place 0.4 mg under the tongue every 5 (five) minutes as needed for chest pain.   Yes Historical Provider, MD  PRESCRIPTION MEDICATION Take 1 tablet by mouth once. Muscle relaxer.   Yes Historical Provider, MD   BP 171/78 mmHg  Pulse 60  Temp(Src) 99 F (37.2 C) (Oral)  Resp 17  SpO2 100% Physical Exam  Constitutional: He is oriented to person, place, and time. He appears well-developed and well-nourished. No distress.  HENT:  Head: Normocephalic and atraumatic.  Decay from the first premolar back on the left  lower side, with swelling at the first and second premolar area.   Eyes: Conjunctivae and EOM are normal.  Neck: Neck supple. No tracheal deviation present.  Cardiovascular: Normal rate.   Pulmonary/Chest: Effort normal. No respiratory distress.  Musculoskeletal: Normal range of motion.  Neurological: He is alert and oriented to person, place, and time.  Skin: Skin is warm and dry.  Psychiatric: He has a normal mood and affect. His behavior is normal.  Nursing note and vitals reviewed.   ED Course  Procedures (including critical care time)  DIAGNOSTIC STUDIES: Oxygen Saturation is 100% on RA, normal by my interpretation.    COORDINATION OF CARE: 3:37 PM - Discussed treatment plan with pt at bedside which includes Rx antibiotics, pain-relieving medication, and referral for dentist. Pt verbalized understanding and agreed to plan. Patient states he is not driving today.   MDM  Pt has an abscess to lower gum.  Pt advised antibiotics gargles, Call dentist for evaluation   Final diagnoses:  Toothache    benitz Hydrocodone clindamycin    Fransico Meadow, PA-C 01/03/15 1902  Dorie Rank, MD 01/06/15 1045

## 2015-01-03 NOTE — ED Notes (Signed)
PT ADVISED THAT HIS HEART RATE IS LOW

## 2015-01-03 NOTE — ED Notes (Signed)
PT c/o dental pain and abscess 2days.  Reports 10/10 pain.  Swelling noted l lower face.  NAD.

## 2015-09-03 ENCOUNTER — Encounter: Payer: Self-pay | Admitting: Gastroenterology

## 2015-09-11 ENCOUNTER — Emergency Department (HOSPITAL_COMMUNITY): Payer: Self-pay

## 2015-09-11 ENCOUNTER — Emergency Department (HOSPITAL_COMMUNITY)
Admission: EM | Admit: 2015-09-11 | Discharge: 2015-09-11 | Disposition: A | Discharge status: Home / Self Care | Payer: Self-pay | Attending: Emergency Medicine | Admitting: Emergency Medicine

## 2015-09-11 ENCOUNTER — Encounter (HOSPITAL_COMMUNITY): Payer: Self-pay

## 2015-09-11 DIAGNOSIS — R531 Weakness: Secondary | ICD-10-CM | POA: Insufficient documentation

## 2015-09-11 DIAGNOSIS — Z8701 Personal history of pneumonia (recurrent): Secondary | ICD-10-CM | POA: Insufficient documentation

## 2015-09-11 DIAGNOSIS — Z8711 Personal history of peptic ulcer disease: Secondary | ICD-10-CM | POA: Insufficient documentation

## 2015-09-11 DIAGNOSIS — G8929 Other chronic pain: Secondary | ICD-10-CM | POA: Insufficient documentation

## 2015-09-11 DIAGNOSIS — F17210 Nicotine dependence, cigarettes, uncomplicated: Secondary | ICD-10-CM | POA: Insufficient documentation

## 2015-09-11 DIAGNOSIS — M5432 Sciatica, left side: Secondary | ICD-10-CM | POA: Insufficient documentation

## 2015-09-11 DIAGNOSIS — I25119 Atherosclerotic heart disease of native coronary artery with unspecified angina pectoris: Secondary | ICD-10-CM | POA: Insufficient documentation

## 2015-09-11 DIAGNOSIS — Z7982 Long term (current) use of aspirin: Secondary | ICD-10-CM | POA: Insufficient documentation

## 2015-09-11 DIAGNOSIS — I10 Essential (primary) hypertension: Secondary | ICD-10-CM | POA: Insufficient documentation

## 2015-09-11 DIAGNOSIS — E7800 Pure hypercholesterolemia, unspecified: Secondary | ICD-10-CM | POA: Insufficient documentation

## 2015-09-11 DIAGNOSIS — R200 Anesthesia of skin: Secondary | ICD-10-CM | POA: Insufficient documentation

## 2015-09-11 DIAGNOSIS — R29898 Other symptoms and signs involving the musculoskeletal system: Secondary | ICD-10-CM

## 2015-09-11 DIAGNOSIS — Z79899 Other long term (current) drug therapy: Secondary | ICD-10-CM | POA: Insufficient documentation

## 2015-09-11 DIAGNOSIS — I252 Old myocardial infarction: Secondary | ICD-10-CM | POA: Insufficient documentation

## 2015-09-11 DIAGNOSIS — Z7902 Long term (current) use of antithrombotics/antiplatelets: Secondary | ICD-10-CM | POA: Insufficient documentation

## 2015-09-11 LAB — RAPID URINE DRUG SCREEN, HOSP PERFORMED
AMPHETAMINES: NOT DETECTED
BENZODIAZEPINES: NOT DETECTED
Barbiturates: NOT DETECTED
COCAINE: NOT DETECTED
OPIATES: NOT DETECTED
TETRAHYDROCANNABINOL: NOT DETECTED

## 2015-09-11 LAB — CBC
HCT: 39.8 % (ref 39.0–52.0)
Hemoglobin: 12.5 g/dL — ABNORMAL LOW (ref 13.0–17.0)
MCH: 22.8 pg — AB (ref 26.0–34.0)
MCHC: 31.4 g/dL (ref 30.0–36.0)
MCV: 72.5 fL — ABNORMAL LOW (ref 78.0–100.0)
PLATELETS: 257 10*3/uL (ref 150–400)
RBC: 5.49 MIL/uL (ref 4.22–5.81)
RDW: 15.6 % — ABNORMAL HIGH (ref 11.5–15.5)
WBC: 6.3 10*3/uL (ref 4.0–10.5)

## 2015-09-11 LAB — I-STAT TROPONIN, ED: Troponin i, poc: 0 ng/mL (ref 0.00–0.08)

## 2015-09-11 LAB — PROTIME-INR
INR: 1.14 (ref 0.00–1.49)
PROTHROMBIN TIME: 14.8 s (ref 11.6–15.2)

## 2015-09-11 LAB — I-STAT CHEM 8, ED
BUN: 14 mg/dL (ref 6–20)
CHLORIDE: 105 mmol/L (ref 101–111)
Calcium, Ion: 1.17 mmol/L (ref 1.13–1.30)
Creatinine, Ser: 1 mg/dL (ref 0.61–1.24)
Glucose, Bld: 84 mg/dL (ref 65–99)
HEMATOCRIT: 45 % (ref 39.0–52.0)
Hemoglobin: 15.3 g/dL (ref 13.0–17.0)
POTASSIUM: 4 mmol/L (ref 3.5–5.1)
SODIUM: 140 mmol/L (ref 135–145)
TCO2: 25 mmol/L (ref 0–100)

## 2015-09-11 LAB — COMPREHENSIVE METABOLIC PANEL
ALBUMIN: 3.4 g/dL — AB (ref 3.5–5.0)
ALK PHOS: 35 U/L — AB (ref 38–126)
ALT: 15 U/L — ABNORMAL LOW (ref 17–63)
AST: 22 U/L (ref 15–41)
Anion gap: 8 (ref 5–15)
BUN: 11 mg/dL (ref 6–20)
CO2: 24 mmol/L (ref 22–32)
Calcium: 9 mg/dL (ref 8.9–10.3)
Chloride: 109 mmol/L (ref 101–111)
Creatinine, Ser: 1.08 mg/dL (ref 0.61–1.24)
GFR calc Af Amer: >60 mL/min (ref >60)
GFR calc non Af Amer: >60 mL/min (ref >60)
GLUCOSE: 87 mg/dL (ref 65–99)
Potassium: 4.1 mmol/L (ref 3.5–5.1)
SODIUM: 141 mmol/L (ref 135–145)
Total Bilirubin: 0.7 mg/dL (ref 0.3–1.2)
Total Protein: 5.9 g/dL — ABNORMAL LOW (ref 6.5–8.1)

## 2015-09-11 LAB — DIFFERENTIAL
BASOS ABS: 0 10*3/uL (ref 0.0–0.1)
Basophils Relative: 1 %
EOS ABS: 0.3 10*3/uL (ref 0.0–0.7)
EOS PCT: 4 %
LYMPHS PCT: 37 %
Lymphs Abs: 2.4 10*3/uL (ref 0.7–4.0)
Monocytes Absolute: 0.6 10*3/uL (ref 0.1–1.0)
Monocytes Relative: 10 %
NEUTROS PCT: 48 %
Neutro Abs: 3.1 10*3/uL (ref 1.7–7.7)

## 2015-09-11 LAB — URINALYSIS, ROUTINE W REFLEX MICROSCOPIC
Bilirubin Urine: NEGATIVE
Glucose, UA: NEGATIVE mg/dL
Hgb urine dipstick: NEGATIVE
Ketones, ur: NEGATIVE mg/dL
Leukocytes, UA: NEGATIVE
Nitrite: NEGATIVE
PH: 5.5 (ref 5.0–8.0)
Protein, ur: NEGATIVE mg/dL
Specific Gravity, Urine: 1.012 (ref 1.005–1.030)

## 2015-09-11 LAB — APTT: APTT: 29 s (ref 24–37)

## 2015-09-11 LAB — ETHANOL: Alcohol, Ethyl (B): <5 mg/dL (ref <5)

## 2015-09-11 MED ORDER — HYDROCODONE-ACETAMINOPHEN 5-325 MG PO TABS
1.0000 | ORAL_TABLET | Freq: Once | ORAL | Status: AC
  Administered 2015-09-11: 1 via ORAL
  Filled 2015-09-11: qty 1

## 2015-09-11 MED ORDER — IBUPROFEN 400 MG PO TABS: 400.0000 mg | ORAL_TABLET | Freq: Three times a day (TID) | ORAL | Status: DC

## 2015-09-11 MED ORDER — HYDROCODONE-ACETAMINOPHEN 5-325 MG PO TABS: 1.0000 | ORAL_TABLET | ORAL | Status: DC | PRN

## 2015-09-11 MED ORDER — METHYLPREDNISOLONE 4 MG PO TBPK: ORAL_TABLET | ORAL | Status: DC

## 2015-09-11 NOTE — ED Notes (Signed)
Pt here for left flank pain and leg pain, onset today, worse with ambulation

## 2015-09-11 NOTE — ED Notes (Signed)
Patient transported to MRI 

## 2015-09-11 NOTE — ED Notes (Signed)
Ambulated Patient approximately 150 ft. Pt. reports continued pain and numbness.  Gait is unsteady but patient is able to ambulate with a limp. Pt. denies any shortness of breath or difficulty other than leg issue.

## 2015-09-11 NOTE — Discharge Instructions (Signed)

## 2015-09-11 NOTE — ED Provider Notes (Signed)
CSN: [id number]     Arrival date & time 09/11/15  [id number] History   First MD Initiated Contact with Patient 09/11/15 [phone]     Chief Complaint  Patient presents with  . Flank Pain     (Consider location/radiation/quality/duration/timing/severity/associated sxs/prior Treatment) HPI Comments: Patient complains of one-week history of left-sided low back pain causing pain and numbness and tingling in his left leg with some weakness. Pain has been worsening over the past week. No falls or trauma. Using Aleve with some relief. Denies any bowel or bladder incontinence. Denies any difficulty with urination. No testicular pain, abdominal pain or chest pain. Denies any history of back problems. Denies any previous back surgeries. History of CAD with stents, peptic ulcer disease is not diabetic. He is not having any chest pain currently..   Patient is a 64 y.o. male presenting with flank pain. The history is provided by the patient.  Flank Pain Pertinent negatives include no chest pain, no abdominal pain, no headaches and no shortness of breath.    Past Medical History  Diagnosis Date  . Coronary artery disease   . Hypertension   . High cholesterol   . Arthritis   . Chronic lower back pain   . Angina   . Peptic ulcer disease   . DJD (degenerative joint disease)   . SVT (supraventricular tachycardia) ([place])   . Sinus bradycardia 09/28/11  . Inferior MI ([place]) 08/2006  . MI (myocardial infarction) ([place]) 2011  . Pneumonia     "when I was younger"  . Walking pneumonia ~ 2009   Past Surgical History  Procedure Laterality Date  . Colectomy  1990's    "ulcer burst; took out part of my colon"  . Coronary angioplasty with stent placement  08/2006    "1" cypher drug eluting stent ok for up to 3T   . Cardiac catheterization    . Left heart catheterization with coronary angiogram N/A 09/29/2011    Procedure: LEFT HEART CATHETERIZATION WITH CORONARY ANGIOGRAM;  Surgeon: [name], MD;  Location: [facility]  CATH LAB;  Service: Cardiovascular;  Laterality: N/A;   History reviewed. No pertinent family history. Social History  Substance Use Topics  . Smoking status: Current Every Day Smoker -- 0.50 packs/day for 52 years    Types: Cigarettes  . Smokeless tobacco: Never Used  . Alcohol Use: 7.2 oz/week    12 Cans of beer per week    Review of Systems  Constitutional: Negative for fever, activity change and appetite change.  Respiratory: Negative for cough, chest tightness and shortness of breath.   Cardiovascular: Negative for chest pain and leg swelling.  Gastrointestinal: Negative for nausea, vomiting and abdominal pain.  Genitourinary: Positive for flank pain. Negative for penile swelling.  Musculoskeletal: Positive for back pain. Negative for myalgias and arthralgias.  Skin: Negative for rash.  Neurological: Positive for weakness and numbness. Negative for dizziness, light-headedness and headaches.  A complete 10 system review of systems was obtained and all systems are negative except as noted in the HPI and PMH.      Allergies  Review of patient's allergies indicates no known allergies.  Home Medications   Prior to Admission medications   Medication Sig Start Date End Date Taking? Authorizing Provider  amLODipine (NORVASC) 5 MG tablet Take 5 mg by mouth daily. 11/19/12  Yes Historical Provider, MD  aspirin EC 81 MG tablet Take 81 mg by mouth daily.   Yes Historical Provider, MD  atorvastatin (LIPITOR) 40 MG tablet  Take 40 mg by mouth daily at 6 PM.  10/09/12  Yes Historical Provider, MD  clopidogrel (PLAVIX) 75 MG tablet Take 75 mg by mouth daily. 11/19/12  Yes Historical Provider, MD  nitroGLYCERIN (NITROSTAT) 0.4 MG SL tablet Place 0.4 mg under the tongue every 5 (five) minutes as needed for chest pain.   Yes Historical Provider, MD  HYDROcodone-acetaminophen (NORCO/VICODIN) 5-325 MG tablet Take 1 tablet by mouth every 4 (four) hours as needed. 09/11/15   Ezequiel Essex, MD   ibuprofen (ADVIL,MOTRIN) 400 MG tablet Take 1 tablet (400 mg total) by mouth 3 (three) times daily. 09/11/15   Ezequiel Essex, MD  methylPREDNISolone (MEDROL DOSEPAK) 4 MG TBPK tablet As directed 09/11/15   Ezequiel Essex, MD   BP 142/72 mmHg  Pulse 44  Temp(Src) 97.8 F (36.6 C) (Oral)  Resp 14  Ht 6' (1.829 m)  Wt 145 lb (65.772 kg)  BMI 19.66 kg/m2  SpO2 99% Physical Exam  Constitutional: He is oriented to person, place, and time. He appears well-developed and well-nourished. No distress.  HENT:  Head: Normocephalic and atraumatic.  Mouth/Throat: Oropharynx is clear and moist. No oropharyngeal exudate.  Eyes: Conjunctivae and EOM are normal. Pupils are equal, round, and reactive to light.  Neck: Normal range of motion. Neck supple.  No meningismus.  Cardiovascular: Normal rate, regular rhythm, normal heart sounds and intact distal pulses.   No murmur heard. Pulmonary/Chest: Effort normal and breath sounds normal. No respiratory distress.  Abdominal: Soft. There is no tenderness. There is no rebound and no guarding.  Musculoskeletal: Normal range of motion. He exhibits tenderness. He exhibits no edema.  Left paraspinal lumbar back pain, no midline pain  Neurological: He is alert and oriented to person, place, and time. No cranial nerve deficit. He exhibits normal muscle tone. Coordination normal.  4/5 strength of left lower leg and left arm. Decreased grip strength on left. Cranial nerves II-12 intact, no facial asymmetry, tongue midline, no pronator drift.   Ankle plantar and dorsiflexion intact. Great toe extension intact bilaterally. +2 DP and PT pulses. +2 patellar reflexes on R, +1 reflex on L. Normal gait.   Skin: Skin is warm.  Psychiatric: He has a normal mood and affect. His behavior is normal.  Nursing note and vitals reviewed.   ED Course  Procedures (including critical care time) Labs Review Labs Reviewed  CBC - Abnormal; Notable for the following:     Hemoglobin 12.5 (*)    MCV 72.5 (*)    MCH 22.8 (*)    RDW 15.6 (*)    All other components within normal limits  COMPREHENSIVE METABOLIC PANEL - Abnormal; Notable for the following:    Total Protein 5.9 (*)    Albumin 3.4 (*)    ALT 15 (*)    Alkaline Phosphatase 35 (*)    All other components within normal limits  URINALYSIS, ROUTINE W REFLEX MICROSCOPIC (NOT AT [facility])  ETHANOL  PROTIME-INR  APTT  DIFFERENTIAL  URINE RAPID DRUG SCREEN, HOSP PERFORMED  I-STAT CHEM 8, ED  I-STAT TROPOININ, ED  CBG MONITORING, ED    Imaging Review Ct Head Wo Contrast  09/11/2015  CLINICAL DATA:  Left-sided weakness for several days EXAM: CT HEAD WITHOUT CONTRAST TECHNIQUE: Contiguous axial images were obtained from the base of the skull through the vertex without intravenous contrast. COMPARISON:  09/28/2011 FINDINGS: Brain parenchyma, ventricular system, and extra-axial space are within normal limits. No mass effect, midline shift, or acute hemorrhage. Cranium is intact. IMPRESSION: No  acute intracranial pathology. Electronically Signed   By: Marybelle Killings M.D.   On: 09/11/2015 09:04   Mr Brain Wo Contrast  09/11/2015  CLINICAL DATA:  Left-sided weakness.  Left flank and leg pain. EXAM: MRI HEAD WITHOUT CONTRAST TECHNIQUE: Multiplanar, multiecho pulse sequences of the brain and surrounding structures were obtained without intravenous contrast. COMPARISON:  Head CT 09/11/2015 and MRI 07/04/2005 FINDINGS: There is no evidence of acute infarct, intracranial hemorrhage, mass, midline shift, or extra-axial fluid collection. Ventricles and sulci are within normal limits for age. T2 hyperintensities in the periventricular cerebral white matter bilaterally have progressed from 2007 and are nonspecific but compatible with mild chronic small vessel ischemic disease. Minimal chronic small vessel ischemic changes are noted in the pons. Orbits are unremarkable. A small right maxillary sinus mucous retention cyst and  trace bilateral mastoid effusions are noted. Major intracranial vascular flow voids are preserved. IMPRESSION: 1. No acute intracranial abnormality. 2. Mild chronic small vessel ischemic disease. Electronically Signed   By: Logan Bores M.D.   On: 09/11/2015 12:24   Mr Lumbar Spine Wo Contrast  09/11/2015  CLINICAL DATA:  Left flank pain, leg pain EXAM: MRI LUMBAR SPINE WITHOUT CONTRAST TECHNIQUE: Multiplanar, multisequence MR imaging of the lumbar spine was performed. No intravenous contrast was administered. COMPARISON:  None. FINDINGS: Segmentation: Transitional anatomy of the lumbar spine. Please refer to the enumerated sagittal images prior to intervention. Alignment: The vertebral bodies of the lumbar spine are normal in alignment. Bones: The vertebral bodies of the lumbar spine are normal in size. There is normal bone marrow signal demonstrated throughout the vertebra. The visualized portions of the SI joints are unremarkable. Conus medullaris: Extends to the L1 level and appears normal. The nerve roots of the cauda equina and the filum terminale are normal. Paraspinal and other soft tissues: There is no focal abnormality. The imaged intra-abdominal contents are unremarkable. Disc levels: Disc spaces: Degenerative disc disease with mild disc height loss at L5-S1. T12-L1: No significant disc bulge. No evidence of neural foraminal stenosis. No central canal stenosis. L1-L2: No significant disc bulge. No evidence of neural foraminal stenosis. No central canal stenosis. L2-L3: No significant disc bulge. No evidence of neural foraminal stenosis. No central canal stenosis. L3-L4: Minimal broad-based disc bulge. Mild bilateral facet arthropathy. No evidence of neural foraminal stenosis. No central canal stenosis. L4-L5: Mild broad-based disc bulge. Mild bilateral facet arthropathy. Mild bilateral foraminal narrowing. No central canal stenosis. L5-S1: Mild broad-based disc bulge. Mild bilateral facet arthropathy.  Moderate bilateral foraminal stenosis. No central canal stenosis. IMPRESSION: 1. No acute injury of the lumbar spine. 2. At L5-S1 there is a mild broad-based disc bulge. Mild bilateral facet arthropathy. Moderate bilateral foraminal stenosis. 3. At L4-5 there is a mild broad-based disc bulge. Mild bilateral facet arthropathy. Mild bilateral foraminal narrowing. Electronically Signed   By: Kathreen Devoid   On: 09/11/2015 13:13   I have personally reviewed and evaluated these images and lab results as part of my medical decision-making.   EKG Interpretation   Date/Time:  Saturday September 11 2015 06:43:21 EDT Ventricular Rate:  46 PR Interval:  159 QRS Duration: 99 QT Interval:  465 QTC Calculation: 407 R Axis:   76 Text Interpretation:  Sinus bradycardia Biatrial enlargement Borderline T  wave abnormalities ST elevation, consider anterior injury - not new No  acute changes No significant change since last tracing Confirmed by  Kathrynn Humble, MD, ANKIT ([id number]) on 09/11/2015 7:13:08 AM      MDM  Final diagnoses:  Left leg weakness  Sciatica of left side   Patient with left leg weakness and low back pain for the past one week. On exam also noted to have left arm weakness. Patient was not aware of this. Last seen normal one week ago.  Will obtain labs, CT head. CT head negative. Labs reassuring.. Distal pulses intact  MRI shows no evidence of stroke.  L spine MRI shows small disc bulges without cord compression or cauda equina. Will treat as sciatica with steroids, pain control. Followup with PCP and spine surgery. He is able to ambulate. HR in 40-50s as previously. Sinus on EKG  He has no symptoms.  Return precautions discussed.  Ezequiel Essex, MD 09/11/15 1750

## 2016-08-19 ENCOUNTER — Encounter (HOSPITAL_COMMUNITY): Payer: Self-pay | Admitting: Emergency Medicine

## 2016-08-19 ENCOUNTER — Emergency Department (HOSPITAL_COMMUNITY): Payer: Self-pay

## 2016-08-19 ENCOUNTER — Observation Stay (HOSPITAL_COMMUNITY)
Admission: EM | Admit: 2016-08-19 | Discharge: 2016-08-20 | Disposition: A | Discharge status: Home / Self Care | Payer: Self-pay | Attending: Cardiovascular Disease | Admitting: Cardiovascular Disease

## 2016-08-19 DIAGNOSIS — Z7982 Long term (current) use of aspirin: Secondary | ICD-10-CM | POA: Insufficient documentation

## 2016-08-19 DIAGNOSIS — E785 Hyperlipidemia, unspecified: Secondary | ICD-10-CM | POA: Insufficient documentation

## 2016-08-19 DIAGNOSIS — F17210 Nicotine dependence, cigarettes, uncomplicated: Secondary | ICD-10-CM | POA: Insufficient documentation

## 2016-08-19 DIAGNOSIS — R079 Chest pain, unspecified: Principal | ICD-10-CM | POA: Diagnosis present

## 2016-08-19 DIAGNOSIS — I10 Essential (primary) hypertension: Secondary | ICD-10-CM | POA: Insufficient documentation

## 2016-08-19 DIAGNOSIS — Z8679 Personal history of other diseases of the circulatory system: Secondary | ICD-10-CM | POA: Insufficient documentation

## 2016-08-19 DIAGNOSIS — Z955 Presence of coronary angioplasty implant and graft: Secondary | ICD-10-CM | POA: Insufficient documentation

## 2016-08-19 DIAGNOSIS — J069 Acute upper respiratory infection, unspecified: Secondary | ICD-10-CM | POA: Insufficient documentation

## 2016-08-19 DIAGNOSIS — Z79899 Other long term (current) drug therapy: Secondary | ICD-10-CM | POA: Insufficient documentation

## 2016-08-19 DIAGNOSIS — I420 Dilated cardiomyopathy: Secondary | ICD-10-CM | POA: Insufficient documentation

## 2016-08-19 DIAGNOSIS — I2510 Atherosclerotic heart disease of native coronary artery without angina pectoris: Secondary | ICD-10-CM | POA: Insufficient documentation

## 2016-08-19 DIAGNOSIS — D72829 Elevated white blood cell count, unspecified: Secondary | ICD-10-CM | POA: Insufficient documentation

## 2016-08-19 DIAGNOSIS — I252 Old myocardial infarction: Secondary | ICD-10-CM | POA: Insufficient documentation

## 2016-08-19 LAB — RAPID STREP SCREEN (MED CTR MEBANE ONLY): STREPTOCOCCUS, GROUP A SCREEN (DIRECT): NEGATIVE

## 2016-08-19 LAB — DIFFERENTIAL
BASOS ABS: 0 10*3/uL (ref 0.0–0.1)
BASOS PCT: 0 %
Eosinophils Absolute: 0 10*3/uL (ref 0.0–0.7)
Eosinophils Relative: 0 %
LYMPHS ABS: 1.6 10*3/uL (ref 0.7–4.0)
LYMPHS PCT: 6 %
MONO ABS: 2.8 10*3/uL — AB (ref 0.1–1.0)
MONOS PCT: 11 %
NEUTROS ABS: 20.4 10*3/uL — AB (ref 1.7–7.7)
Neutrophils Relative %: 82 %

## 2016-08-19 LAB — HEPATIC FUNCTION PANEL
ALT: 19 U/L (ref 17–63)
AST: 25 U/L (ref 15–41)
Albumin: 3.8 g/dL (ref 3.5–5.0)
Alkaline Phosphatase: 49 U/L (ref 38–126)
BILIRUBIN DIRECT: 0.1 mg/dL (ref 0.1–0.5)
BILIRUBIN INDIRECT: 0.6 mg/dL (ref 0.3–0.9)
TOTAL PROTEIN: 7.2 g/dL (ref 6.5–8.1)
Total Bilirubin: 0.7 mg/dL (ref 0.3–1.2)

## 2016-08-19 LAB — HEPARIN LEVEL (UNFRACTIONATED)
Heparin Unfractionated: 0.22 IU/mL — ABNORMAL LOW (ref 0.30–0.70)
Heparin Unfractionated: 0.22 IU/mL — ABNORMAL LOW (ref 0.30–0.70)

## 2016-08-19 LAB — CBC
HEMATOCRIT: 41.4 % (ref 39.0–52.0)
HEMOGLOBIN: 14 g/dL (ref 13.0–17.0)
MCH: 24.1 pg — ABNORMAL LOW (ref 26.0–34.0)
MCHC: 33.8 g/dL (ref 30.0–36.0)
MCV: 71.4 fL — ABNORMAL LOW (ref 78.0–100.0)
Platelets: 233 10*3/uL (ref 150–400)
RBC: 5.8 MIL/uL (ref 4.22–5.81)
RDW: 15.2 % (ref 11.5–15.5)
WBC: 26.7 10*3/uL — AB (ref 4.0–10.5)

## 2016-08-19 LAB — BASIC METABOLIC PANEL
ANION GAP: 7 (ref 5–15)
BUN: 12 mg/dL (ref 6–20)
CALCIUM: 9.2 mg/dL (ref 8.9–10.3)
CO2: 23 mmol/L (ref 22–32)
Chloride: 103 mmol/L (ref 101–111)
Creatinine, Ser: 1.38 mg/dL — ABNORMAL HIGH (ref 0.61–1.24)
GFR calc Af Amer: >60 mL/min (ref >60)
GFR, EST NON AFRICAN AMERICAN: 53 mL/min — AB (ref >60)
GLUCOSE: 160 mg/dL — AB (ref 65–99)
Potassium: 4 mmol/L (ref 3.5–5.1)
Sodium: 133 mmol/L — ABNORMAL LOW (ref 135–145)

## 2016-08-19 LAB — INFLUENZA PANEL BY PCR (TYPE A & B)
Influenza A By PCR: NEGATIVE
Influenza B By PCR: NEGATIVE

## 2016-08-19 LAB — TROPONIN I
Troponin I: 0.1 ng/mL (ref <0.03)
Troponin I: 0.03 ng/mL (ref <0.03)

## 2016-08-19 LAB — I-STAT TROPONIN, ED: TROPONIN I, POC: 0.01 ng/mL (ref 0.00–0.08)

## 2016-08-19 MED ORDER — PHENOL 1.4 % MT LIQD
1.0000 | OROMUCOSAL | Status: DC | PRN
  Administered 2016-08-19: 1 via OROMUCOSAL
  Filled 2016-08-19: qty 177

## 2016-08-19 MED ORDER — HEPARIN BOLUS VIA INFUSION
3000.0000 [IU] | Freq: Once | INTRAVENOUS | Status: AC
  Administered 2016-08-19: 3000 [IU] via INTRAVENOUS
  Filled 2016-08-19: qty 3000

## 2016-08-19 MED ORDER — ATORVASTATIN CALCIUM 40 MG PO TABS
40.0000 mg | ORAL_TABLET | Freq: Every day | ORAL | Status: DC
  Administered 2016-08-19: 40 mg via ORAL
  Filled 2016-08-19 (×2): qty 1

## 2016-08-19 MED ORDER — NITROGLYCERIN 0.4 MG SL SUBL
0.4000 mg | SUBLINGUAL_TABLET | SUBLINGUAL | Status: DC | PRN
  Administered 2016-08-19 (×2): 0.4 mg via SUBLINGUAL
  Filled 2016-08-19: qty 1

## 2016-08-19 MED ORDER — ACETAMINOPHEN 325 MG PO TABS
650.0000 mg | ORAL_TABLET | ORAL | Status: DC | PRN
  Administered 2016-08-19: 650 mg via ORAL
  Filled 2016-08-19: qty 2

## 2016-08-19 MED ORDER — CLOPIDOGREL BISULFATE 75 MG PO TABS
75.0000 mg | ORAL_TABLET | Freq: Every day | ORAL | Status: DC
  Administered 2016-08-19 – 2016-08-20 (×2): 75 mg via ORAL
  Filled 2016-08-19 (×2): qty 1

## 2016-08-19 MED ORDER — HEPARIN (PORCINE) IN NACL 100-0.45 UNIT/ML-% IJ SOLN
1150.0000 [IU]/h | INTRAMUSCULAR | Status: DC
  Administered 2016-08-19: 800 [IU]/h via INTRAVENOUS
  Administered 2016-08-20: 1150 [IU]/h via INTRAVENOUS
  Filled 2016-08-19 (×3): qty 250

## 2016-08-19 MED ORDER — MORPHINE SULFATE (PF) 2 MG/ML IV SOLN
4.0000 mg | Freq: Once | INTRAVENOUS | Status: AC
  Administered 2016-08-19: 4 mg via INTRAVENOUS
  Filled 2016-08-19 (×2): qty 2

## 2016-08-19 MED ORDER — ASPIRIN EC 81 MG PO TBEC
81.0000 mg | DELAYED_RELEASE_TABLET | Freq: Every day | ORAL | Status: DC
  Administered 2016-08-20: 81 mg via ORAL
  Filled 2016-08-19: qty 1

## 2016-08-19 MED ORDER — ASPIRIN 81 MG PO CHEW
324.0000 mg | CHEWABLE_TABLET | Freq: Once | ORAL | Status: AC
  Administered 2016-08-19: 324 mg via ORAL
  Filled 2016-08-19: qty 4

## 2016-08-19 MED ORDER — ONDANSETRON HCL 4 MG/2ML IJ SOLN: 4.0000 mg | Freq: Four times a day (QID) | INTRAMUSCULAR | Status: DC | PRN

## 2016-08-19 MED ORDER — ENSURE ENLIVE PO LIQD
237.0000 mL | Freq: Two times a day (BID) | ORAL | Status: DC
  Administered 2016-08-19 – 2016-08-20 (×2): 237 mL via ORAL

## 2016-08-19 NOTE — ED Notes (Signed)
[phone] Patient's wife, Langley Gauss

## 2016-08-19 NOTE — ED Notes (Signed)
[facility] called for transport. 

## 2016-08-19 NOTE — ED Triage Notes (Addendum)
Pt complaint of intermittent mid chest tightness for 2 days; pt verbalizes associated weakness, sweats, and right neck pain. Hx of MI.

## 2016-08-19 NOTE — ED Provider Notes (Signed)
[facility] DEPT Provider Note   CSN: [id number] Arrival date & time: 08/19/16  3716     History   Chief Complaint Chief Complaint  Patient presents with  . Chest Pain    HPI [name] is a 66 y.o. male.  HPI   66 year old male with a history of coronary artery disease presents with concern for chest tightness that began last night. 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 Last night and started spontaneously at rest and continued until this morning. Described as starting on the right side of his head and jaw then radiating down his right neck, followed by developing a chest tightness across the retrosternal area. Reports associated diaphoresis, fatigue and lightheadedness. Denies any nausea or vomiting. Denies leg pain or swelling. Denies history of DVTs, recent surgery or immobilization.  Sees Dr. Terrence Dupont, last catheterization appears to be in 2013.  Past Medical History:  Diagnosis Date  . Angina   . Arthritis   . Chronic lower back pain   . Coronary artery disease   . DJD (degenerative joint disease)   . High cholesterol   . Hypertension   . 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 MI (myocardial infarction) 2011  . Peptic ulcer disease   . Pneumonia    "when I was younger"  . Sinus bradycardia 09/28/11  . SVT (supraventricular tachycardia) (Uncertain)   . Walking pneumonia ~ 2009    Patient Active Problem List   Diagnosis Date Noted  . Chest pain 08/19/2016  . Chest pain at rest 06/01/2013  . Rhinosinusitis 02/24/2013  . Abrasion of cornea, right 07/18/2012  . Cough 07/01/2012  . Acute coronary syndrome ([place]) 09/28/2011  . MI (myocardial infarction)   . HIP PAIN, RIGHT 06/09/2010  . TOBACCO USER 02/20/2009  . Hyperlipidemia LDL goal < 70 01/12/2009  . SUBSTANCE ABUSE, MULTIPLE 01/12/2009  . HYPERTENSION 01/12/2009  . SUPRAVENTRICULAR TACHYCARDIA 01/12/2009  . GERD 01/12/2009    . DEGENERATIVE JOINT DISEASE 01/12/2009  . TOBACCO ABUSE, HX OF 01/12/2009  . HYPERTENSION, BENIGN ESSENTIAL 07/21/2008  . CORONARY, ARTERIOSCLEROSIS 07/12/2006  . GASTRIC ULCER ACUTE WITH HEMORRHAGE 07/12/2006    Past Surgical History:  Procedure Laterality Date  . CARDIAC CATHETERIZATION    . COLECTOMY  1990's   "ulcer burst; took out part of my colon"  . CORONARY ANGIOPLASTY WITH STENT PLACEMENT  08/2006   "1" cypher drug eluting stent ok for up to 3T   . LEFT HEART CATHETERIZATION WITH CORONARY ANGIOGRAM N/A 09/29/2011   Procedure: LEFT HEART CATHETERIZATION WITH CORONARY ANGIOGRAM;  Surgeon: [name], MD;  Location: [facility] CATH [facility];  Service: Cardiovascular;  Laterality: N/A;       Home Medications    Prior to Admission medications   Medication Sig Start Date End Date Taking? Authorizing Provider  acetaminophen (TYLENOL) 325 MG tablet Take 325 mg by mouth every 6 (six) hours as needed.   Yes Historical Provider, MD  amLODipine (NORVASC) 2.5 MG tablet Take 2.5 mg by mouth daily.  11/19/12  Yes Historical Provider, MD  aspirin EC 81 MG tablet Take 81 mg by mouth daily.   Yes Historical Provider, MD  atorvastatin (LIPITOR) 40 MG tablet Take 40 mg by mouth daily at 6 PM.  10/09/12  Yes Historical Provider, MD  clopidogrel (PLAVIX) 75 MG tablet Take 75 mg by mouth daily. 11/19/12  Yes Historical Provider, MD  ibuprofen (ADVIL,MOTRIN)  400 MG tablet Take 1 tablet (400 mg total) by mouth 3 (three) times daily. Patient taking differently: Take 200 mg by mouth 3 (three) times daily.  09/11/15  Yes Ezequiel Essex, MD  nitroGLYCERIN (NITROSTAT) 0.4 MG SL tablet Place 0.4 mg under the tongue every 5 (five) minutes as needed for chest pain.    Historical Provider, MD    Family History No family history on file.  Social History Social History  Substance Use Topics  . Smoking status: Current Every Day Smoker    Packs/day: 0.50    Years: 52.00    Types: Cigarettes  . Smokeless tobacco:  Never Used  . Alcohol use 7.2 oz/week    12 Cans of beer per week     Allergies   Patient has no known allergies.   Review of Systems Review of Systems  Constitutional: Positive for diaphoresis and fatigue. Negative for fever.  HENT: Negative for sore throat.   Eyes: Negative for visual disturbance.  Respiratory: Positive for shortness of breath. Negative for cough.   Cardiovascular: Positive for chest pain. Negative for leg swelling.  Gastrointestinal: Negative for abdominal pain, diarrhea, nausea and vomiting.  Genitourinary: Negative for difficulty urinating and dysuria.  Musculoskeletal: Positive for neck pain. Negative for back pain.  Skin: Negative for rash.  Neurological: Positive for light-headedness and headaches. Negative for syncope.     Physical Exam Updated Vital Signs BP (!) 172/76 (BP Location: Left Arm)   Pulse (!) 54   Temp 98.5 F (36.9 C) (Oral)   Resp 15   Ht 6\' 1"  (1.854 m)   Wt 139 lb 14.4 oz (63.5 kg)   SpO2 100%   BMI 18.46 kg/m   Physical Exam  Constitutional: He is oriented to person, place, and time. He appears well-developed and well-nourished. No distress.  HENT:  Head: Normocephalic and atraumatic.  Eyes: Conjunctivae and EOM are normal.  Neck: Normal range of motion.  Cardiovascular: Normal rate, regular rhythm, normal heart sounds and intact distal pulses.  Exam reveals no gallop and no friction rub.   No murmur heard. Pulmonary/Chest: Effort normal and breath sounds normal. No respiratory distress. He has no wheezes. He has no rales.  Abdominal: Soft. He exhibits no distension. There is no tenderness. There is no guarding.  Musculoskeletal: He exhibits no edema.  Neurological: He is alert and oriented to person, place, and time.  Skin: Skin is warm and dry. He is not diaphoretic.  Nursing note and vitals reviewed.    ED Treatments / Results  Labs (all labs ordered are listed, but only abnormal results are displayed) Labs  Reviewed  BASIC METABOLIC PANEL - Abnormal; Notable for the following:       Result Value   Sodium 133 (*)    Glucose, Bld 160 (*)    Creatinine, Ser 1.38 (*)    GFR calc non Af Amer 53 (*)    All other components within normal limits  CBC - Abnormal; Notable for the following:    WBC 26.7 (*)    MCV 71.4 (*)    MCH 24.1 (*)    All other components within normal limits  DIFFERENTIAL - Abnormal; Notable for the following:    Neutro Abs 20.4 (*)    Monocytes Absolute 2.8 (*)    All other components within normal limits  TROPONIN I - Abnormal; Notable for the following:    Troponin I 0.10 (*)    All other components within normal limits  HEPATIC FUNCTION  PANEL  HIV ANTIBODY (ROUTINE TESTING)  HEPARIN LEVEL (UNFRACTIONATED)  TROPONIN I  TROPONIN I  HEPARIN LEVEL (UNFRACTIONATED)  BASIC METABOLIC PANEL  LIPID PANEL  CBC  PROTIME-INR  HEPARIN LEVEL (UNFRACTIONATED)  HEPARIN LEVEL (UNFRACTIONATED)  I-STAT TROPOININ, ED    EKG  EKG Interpretation None       Radiology Dg Chest 2 View  Result Date: 08/19/2016 CLINICAL DATA:  Pt c/o central cp x 3 days, reports prev hx "blockages and stents" x 3. EXAM: CHEST  2 VIEW COMPARISON:  Chest x-ray dated 06/01/2013. FINDINGS: The heart size and mediastinal contours are within normal limits. Both lungs are clear. The visualized skeletal structures are unremarkable. IMPRESSION: No active cardiopulmonary disease. No evidence of pneumonia or pulmonary edema. Electronically Signed   By: Franki Cabot M.D.   On: 08/19/2016 10:42    Procedures Procedures (including critical care time)  Medications Ordered in ED Medications  nitroGLYCERIN (NITROSTAT) SL tablet 0.4 mg (0.4 mg Sublingual Given 08/19/16 1017)  aspirin EC tablet 81 mg (not administered)  acetaminophen (TYLENOL) tablet 650 mg (not administered)  ondansetron (ZOFRAN) injection 4 mg (not administered)  atorvastatin (LIPITOR) tablet 40 mg (40 mg Oral Given 08/19/16 1642)    clopidogrel (PLAVIX) tablet 75 mg (75 mg Oral Given 08/19/16 1348)  heparin ADULT infusion 100 units/mL (25000 units/270mL sodium chloride 0.45%) (800 Units/hr Intravenous New Bag/Given 08/19/16 1318)  feeding supplement (ENSURE ENLIVE) (ENSURE ENLIVE) liquid 237 mL (237 mLs Oral Given 08/19/16 1700)  aspirin chewable tablet 324 mg (324 mg Oral Given 08/19/16 1008)  morphine 2 MG/ML injection 4 mg (4 mg Intravenous Given 08/19/16 1049)  heparin bolus via infusion 3,000 Units (3,000 Units Intravenous Bolus from Bag 08/19/16 1318)     Initial Impression / Assessment and Plan / ED Course  I have reviewed the triage vital signs and the nursing notes.  Pertinent labs & imaging results that were available during my care of the patient were reviewed by me and considered in my medical decision making (see chart for details).     66 year old male with history of CAD, hypertension, hyperlipidemia, history of SVT, presents with concern of chest pain. Differential diagnosis for chest pain includes pulmonary embolus, dissection, pneumothorax, pneumonia, ACS, myocarditis, pericarditis.  EKG was done and evaluate by me and showed similar elevation in V3 to prior, more exaggerated T-wave inversions in the inferior leads in comparison to prior. Chest x-ray was done and evaluated by me and radiology and showed no sign of pneumonia or pneumothorax. Patient is low risk [place], no hypoxia, no tachypnea, no hx of DVT  and have low suspicion for PE.  Troponin negative.  Patient's symptoms concerning for angina. Consulted Dr. Doylene Canard.  Patient given aspirin. Pain improved with nitroglycerin, however he reports lightheadedness. He was given morphine for additional pain. Labs are significant for a leukocytosis of 26.7. Patient denies any infectious symptoms, denies IV drug use or other drug use. Unclear etiology of his significant leukocytosis at this time.  Patient will be transferred to [facility] for further care with Dr.  Doylene Canard.   Final Clinical Impressions(s) / ED Diagnoses   Final diagnoses:  Chest pain, unspecified type  Chest pain with high risk for cardiac etiology    New Prescriptions Current Discharge Medication List       Gareth Morgan, MD 08/19/16 1754

## 2016-08-19 NOTE — ED Notes (Signed)
BP decreased from 110 to 100 second MTG given with continued monitoring, pain down to 8/10

## 2016-08-19 NOTE — Progress Notes (Signed)
CRITICAL VALUE ALERT  Critical value received:  Troponin (0.10)  Date of notification:  08/19/16  Time of notification:  0141  Critical value read back:Yes.    Nurse who received alert:  Magdalene River  MD notified (1st page):  Dr. Doylene Canard  Time of first page:  [phone]  Responding MD:  Dr. Doylene Canard  Time MD responded:  [phone]

## 2016-08-19 NOTE — Progress Notes (Signed)
ANTICOAGULATION CONSULT NOTE - Follow Up Consult  Pharmacy Consult for Heparin Indication: chest pain/ACS  No Known Allergies  Patient Measurements: Height: 6\' 1"  (185.4 cm) Weight: 139 lb 14.4 oz (63.5 kg) IBW/kg (Calculated) : 79.9 Heparin Dosing Weight:    Vital Signs: Temp: 98.5 F (36.9 C) (04/07 1509) Temp Source: Oral (04/07 1509) BP: 172/76 (04/07 1509) Pulse Rate: 54 (04/07 1509)  Labs:  Recent Labs  08/19/16 0953 08/19/16 1600 08/19/16 1811  HGB 14.0  --   --   HCT 41.4  --   --   PLT 233  --   --   HEPARINUNFRC  --   --  0.22*  0.22*  CREATININE 1.38*  --   --   TROPONINI  --  0.10*  --     Estimated Creatinine Clearance: 49.2 mL/min (A) (by C-G formula based on SCr of 1.38 mg/dL (H)).   Assessment: Anticoag: Transferred from [place] with CP. HL 0.22 less than goal.  Goal of Therapy:  Heparin level 0.3-0.7 units/ml Monitor platelets by anticoagulation protocol: Yes   Plan:  Increase IV heparin to 950 units/hr Recheck heparin level in 6 hrs. Daily HL and CBC   Render [name] [place], PharmD, BCPS Clinical Staff Pharmacist Pager [phone]  [name] 08/19/2016,7:18 PM

## 2016-08-19 NOTE — ED Notes (Signed)
1010 1st NTG SL given due to CP 9/10

## 2016-08-19 NOTE — Progress Notes (Signed)
Pt reports right side of throat hurting and worsens with swallowing.  Pt also reports he has had fevers and malaise for the last 3 days.  Pt noted to be afebrile today. Dr. Doylene Canard notified with new orders.  Will continue to monitor.

## 2016-08-19 NOTE — H&P (Signed)
Referring Physician: Charolette Forward, MD  [name] is an 66 y.o. male.                       Chief Complaint: Chest pain  HPI: 66 year old male with recurrent chest pain has PMH of CAD, Inferior wall MI with RCA stent in 2008, hypertension, dyslipidemia, tobacco use disorder. EKG shows sinus rhythm with LVH and old inferior wall MI and lateral wall ischemia.  Past Medical History:  Diagnosis Date  . Angina   . Arthritis   . Chronic lower back pain   . Coronary artery disease   . DJD (degenerative joint disease)   . High cholesterol   . Hypertension   . 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 MI (myocardial infarction) 2011  . Peptic ulcer disease   . Pneumonia    "when I was younger"  . Sinus bradycardia 09/28/11  . SVT (supraventricular tachycardia) ([place])   . Walking pneumonia ~ 2009      Past Surgical History:  Procedure Laterality Date  . CARDIAC CATHETERIZATION    . COLECTOMY  1990's   "ulcer burst; took out part of my colon"  . CORONARY ANGIOPLASTY WITH STENT PLACEMENT  08/2006   "1" cypher drug eluting stent ok for up to 3T   . LEFT HEART CATHETERIZATION WITH CORONARY ANGIOGRAM N/A 09/29/2011   Procedure: LEFT HEART CATHETERIZATION WITH CORONARY ANGIOGRAM;  Surgeon: [name], MD;  Location: [facility] CATH [facility];  Service: Cardiovascular;  Laterality: N/A;    No family history on file. Social History:  reports that he has been smoking Cigarettes.  He has a 26.00 pack-year smoking history. He has never used smokeless tobacco. He reports that he drinks about 7.2 oz of alcohol per week . He reports that he uses drugs, including Marijuana and Cocaine.  Allergies: No Known Allergies   (Not in a hospital admission)  Results for orders placed or performed during the hospital encounter of 08/19/16 (from the past 48 hour(s))  Hepatic function panel     Status: None   Collection Time: 08/19/16  9:50 AM  Result Value Ref Range   Total Protein 7.2 6.5 - 8.1 g/dL   Albumin 3.8 3.5 - 5.0  g/dL   AST 25 15 - 41 U/L   ALT 19 17 - 63 U/L   Alkaline Phosphatase 49 38 - 126 U/L   Total Bilirubin 0.7 0.3 - 1.2 mg/dL   Bilirubin, Direct 0.1 0.1 - 0.5 mg/dL   Indirect Bilirubin 0.6 0.3 - 0.9 mg/dL  Basic metabolic panel     Status: Abnormal   Collection Time: 08/19/16  9:53 AM  Result Value Ref Range   Sodium 133 (L) 135 - 145 mmol/L   Potassium 4.0 3.5 - 5.1 mmol/L   Chloride 103 101 - 111 mmol/L   CO2 23 22 - 32 mmol/L   Glucose, Bld 160 (H) 65 - 99 mg/dL   BUN 12 6 - 20 mg/dL   Creatinine, Ser 1.38 (H) 0.61 - 1.24 mg/dL   Calcium 9.2 8.9 - 10.3 mg/dL   GFR calc non Af Amer 53 (L) >60 mL/min   GFR calc Af Amer >60 >60 mL/min    Comment: (NOTE) The eGFR has been calculated using the CKD EPI equation. This calculation has not been validated in all clinical situations. eGFR's persistently <60 mL/min signify possible Chronic Kidney Disease.    Anion gap 7 5 - 15  CBC  Status: Abnormal   Collection Time: 08/19/16  9:53 AM  Result Value Ref Range   WBC 26.7 (H) 4.0 - 10.5 K/uL   RBC 5.80 4.22 - 5.81 MIL/uL   Hemoglobin 14.0 13.0 - 17.0 g/dL   HCT 41.4 39.0 - 52.0 %   MCV 71.4 (L) 78.0 - 100.0 fL   MCH 24.1 (L) 26.0 - 34.0 pg   MCHC 33.8 30.0 - 36.0 g/dL   RDW 15.2 11.5 - 15.5 %   Platelets 233 150 - 400 K/uL  Differential     Status: Abnormal   Collection Time: 08/19/16  9:53 AM  Result Value Ref Range   Neutrophils Relative % 82 %   Neutro Abs 20.4 (H) 1.7 - 7.7 K/uL   Lymphocytes Relative 6 %   Lymphs Abs 1.6 0.7 - 4.0 K/uL   Monocytes Relative 11 %   Monocytes Absolute 2.8 (H) 0.1 - 1.0 K/uL   Eosinophils Relative 0 %   Eosinophils Absolute 0.0 0.0 - 0.7 K/uL   Basophils Relative 0 %   Basophils Absolute 0.0 0.0 - 0.1 K/uL  I-stat troponin, ED     Status: None   Collection Time: 08/19/16 10:05 AM  Result Value Ref Range   Troponin i, poc 0.01 0.00 - 0.08 ng/mL   Comment 3            Comment: Due to the release kinetics of cTnI, a negative result  within the first hours of the onset of symptoms does not rule out myocardial infarction with certainty. If myocardial infarction is still suspected, repeat the test at appropriate intervals.    Dg Chest 2 View  Result Date: 08/19/2016 CLINICAL DATA:  Pt c/o central cp x 3 days, reports prev hx "blockages and stents" x 3. EXAM: CHEST  2 VIEW COMPARISON:  Chest x-ray dated 06/01/2013. FINDINGS: The heart size and mediastinal contours are within normal limits. Both lungs are clear. The visualized skeletal structures are unremarkable. IMPRESSION: No active cardiopulmonary disease. No evidence of pneumonia or pulmonary edema. Electronically Signed   By: Franki Cabot M.D.   On: 08/19/2016 10:42    Review Of Systems Constitutional: No fever, chills, weight loss or gain. Eyes: No vision change, wears glasses. No discharge or pain. Ears: No hearing loss, No tinnitus. Respiratory: No asthma, COPD, pneumonias. Positive shortness of breath. No hemoptysis. Cardiovascular: Positive chest pain, no palpitation, leg edema. Gastrointestinal: Positive nausea, vomiting, diarrhea, constipation. No GI bleed. No hepatitis. Genitourinary: No dysuria, hematuria, kidney stone. No incontinance. Neurological: No headache, stroke, seizures. Positive dizziness Psychiatry: No psych facility admission for anxiety, depression, suicide. No detox. Skin: No rash. Musculoskeletal: No joint pain, fibromyalgia. No neck pain, back pain. Lymphadenopathy: No lymphadenopathy. Hematology: No anemia or easy bruising.   Blood pressure 109/63, pulse (!) 54, temperature 98.5 F (36.9 C), temperature source Oral, resp. rate 19, height 6' 1"  (1.854 m), weight 65.8 kg (145 lb), SpO2 96 %. Body mass index is 19.13 kg/m. General appearance: alert, cooperative, appears stated age and no distress Head: Normocephalic, atraumatic. Eyes: Brown eyes, pink conjunctiva, corneas clear. PERRL, EOM's intact. Neck: No adenopathy, no carotid  bruit, no JVD, supple, symmetrical, trachea midline and thyroid not enlarged. Resp: Clear to auscultation bilaterally. Cardio: Regular rate and rhythm, S1, S2 normal, II/VI systolic murmur, no click, rub or gallop GI: Soft, non-tender; bowel sounds normal; no organomegaly. Extremities: No edema, cyanosis or clubbing. Skin: Warm and dry.  Neurologic: Alert and oriented X 3, normal strength. Normal coordination  and gait. Moves all 4 extremities.  Assessment/Plan Acute coronary syndrome CAD s/P RCA stent Hypertension Dyslipidemia Tobacco use disorder H/O cocaine and marijuana abuse H/O SVT  Place in observation Nuclear stress test in AM.  Birdie Riddle, MD  08/19/2016, 12:16 PM

## 2016-08-19 NOTE — ED Notes (Signed)
Due to insufficent relief from NTG Morphine 4 mg given

## 2016-08-19 NOTE — ED Notes (Signed)
Very little relief with 2nd NTG pt declines 3rd

## 2016-08-19 NOTE — Progress Notes (Signed)
ANTICOAGULATION CONSULT NOTE - Initial Consult  Pharmacy Consult for IV heparin Indication: chest pain/ACS  No Known Allergies  Patient Measurements: Height: 6\' 1"  (185.4 cm) Weight: 145 lb (65.8 kg) IBW/kg (Calculated) : 79.9 Heparin Dosing Weight: 65.8  Vital Signs: Temp: 98.5 F (36.9 C) (04/07 0943) Temp Source: Oral (04/07 0943) BP: 109/63 (04/07 1115) Pulse Rate: 54 (04/07 1115)  Labs:  Recent Labs  08/19/16 0953  HGB 14.0  HCT 41.4  PLT 233  CREATININE 1.38*    Estimated Creatinine Clearance: 51 mL/min (A) (by C-G formula based on SCr of 1.38 mg/dL (H)).   Medical History: Past Medical History:  Diagnosis Date  . Angina   . Arthritis   . Chronic lower back pain   . Coronary artery disease   . DJD (degenerative joint disease)   . High cholesterol   . Hypertension   . 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 MI (myocardial infarction) 2011  . Peptic ulcer disease   . Pneumonia    "when I was younger"  . Sinus bradycardia 09/28/11  . SVT (supraventricular tachycardia) ([place])   . Walking pneumonia ~ 2009    Medications:  Scheduled:  . [START ON 08/20/2016] aspirin EC  81 mg Oral Daily  . atorvastatin  40 mg Oral q1800  . clopidogrel  75 mg Oral Daily   Infusions:    Assessment: 66 yo male presents to ER with intermittent chest tightness for 2 days with hx MI. To start IV heparin per Rx for possible ACS. Baseline labs good  Goal of Therapy:  Heparin level 0.3-0.7 units/ml Monitor platelets by anticoagulation protocol: Yes   Plan:  1) IV heparin 3000 unit bolus then 2) IV heparin 800 units/hr 3) Check heparin level 6 hours after start of IV heparin 4) daily heparin level and CBC   Adrian Saran, PharmD, BCPS Pager [phone] 08/19/2016 12:18 PM

## 2016-08-20 ENCOUNTER — Observation Stay (HOSPITAL_COMMUNITY): Payer: Self-pay

## 2016-08-20 LAB — BASIC METABOLIC PANEL
ANION GAP: 10 (ref 5–15)
BUN: 10 mg/dL (ref 6–20)
CHLORIDE: 99 mmol/L — AB (ref 101–111)
CO2: 26 mmol/L (ref 22–32)
Calcium: 9.2 mg/dL (ref 8.9–10.3)
Creatinine, Ser: 1.29 mg/dL — ABNORMAL HIGH (ref 0.61–1.24)
GFR calc non Af Amer: 57 mL/min — ABNORMAL LOW (ref >60)
GLUCOSE: 106 mg/dL — AB (ref 65–99)
POTASSIUM: 4 mmol/L (ref 3.5–5.1)
Sodium: 135 mmol/L (ref 135–145)

## 2016-08-20 LAB — HIV ANTIBODY (ROUTINE TESTING W REFLEX): HIV Screen 4th Generation wRfx: NONREACTIVE

## 2016-08-20 LAB — PROTIME-INR
INR: 1.05
PROTHROMBIN TIME: 13.7 s (ref 11.4–15.2)

## 2016-08-20 LAB — CBC
HCT: 38.7 % — ABNORMAL LOW (ref 39.0–52.0)
Hemoglobin: 12.8 g/dL — ABNORMAL LOW (ref 13.0–17.0)
MCH: 23.7 pg — AB (ref 26.0–34.0)
MCHC: 33.1 g/dL (ref 30.0–36.0)
MCV: 71.7 fL — ABNORMAL LOW (ref 78.0–100.0)
PLATELETS: 218 10*3/uL (ref 150–400)
RBC: 5.4 MIL/uL (ref 4.22–5.81)
RDW: 15 % (ref 11.5–15.5)
WBC: 23.3 10*3/uL — AB (ref 4.0–10.5)

## 2016-08-20 LAB — TROPONIN I: TROPONIN I: 0.04 ng/mL — AB (ref <0.03)

## 2016-08-20 LAB — LIPID PANEL
CHOL/HDL RATIO: 2 ratio
Cholesterol: 149 mg/dL (ref 0–200)
HDL: 73 mg/dL (ref >40)
LDL Cholesterol: 63 mg/dL (ref 0–99)
Triglycerides: 66 mg/dL (ref <150)
VLDL: 13 mg/dL (ref 0–40)

## 2016-08-20 LAB — HEPARIN LEVEL (UNFRACTIONATED)
HEPARIN UNFRACTIONATED: 0.18 [IU]/mL — AB (ref 0.30–0.70)
Heparin Unfractionated: 0.33 IU/mL (ref 0.30–0.70)

## 2016-08-20 MED ORDER — TECHNETIUM TC 99M TETROFOSMIN IV KIT
10.0000 | PACK | Freq: Once | INTRAVENOUS | Status: AC | PRN
  Administered 2016-08-20: 10 via INTRAVENOUS

## 2016-08-20 MED ORDER — REGADENOSON 0.4 MG/5ML IV SOLN
0.4000 mg | Freq: Once | INTRAVENOUS | Status: AC
  Administered 2016-08-20: 0.4 mg via INTRAVENOUS
  Filled 2016-08-20: qty 5

## 2016-08-20 MED ORDER — LOSARTAN POTASSIUM 25 MG PO TABS: 25.0000 mg | ORAL_TABLET | Freq: Every day | ORAL | Status: DC

## 2016-08-20 MED ORDER — CARVEDILOL 3.125 MG PO TABS: 3.1250 mg | ORAL_TABLET | Freq: Two times a day (BID) | ORAL | Status: DC

## 2016-08-20 MED ORDER — TECHNETIUM TC 99M TETROFOSMIN IV KIT
30.0000 | PACK | Freq: Once | INTRAVENOUS | Status: AC | PRN
  Administered 2016-08-20: 30 via INTRAVENOUS

## 2016-08-20 MED ORDER — AMLODIPINE BESYLATE 5 MG PO TABS: 5.0000 mg | ORAL_TABLET | Freq: Every day | ORAL | 3 refills | Status: DC

## 2016-08-20 MED ORDER — AZITHROMYCIN 250 MG PO TABS: ORAL_TABLET | ORAL | 0 refills | Status: DC

## 2016-08-20 MED ORDER — AZITHROMYCIN 250 MG PO TABS
500.0000 mg | ORAL_TABLET | Freq: Every day | ORAL | Status: AC
  Administered 2016-08-20: 500 mg via ORAL
  Filled 2016-08-20: qty 2

## 2016-08-20 MED ORDER — LOSARTAN POTASSIUM 25 MG PO TABS: 25.0000 mg | ORAL_TABLET | Freq: Every day | ORAL | 3 refills | Status: DC

## 2016-08-20 MED ORDER — AZITHROMYCIN 250 MG PO TABS: 250.0000 mg | ORAL_TABLET | Freq: Every day | ORAL | Status: DC

## 2016-08-20 MED ORDER — REGADENOSON 0.4 MG/5ML IV SOLN
INTRAVENOUS | Status: AC
  Filled 2016-08-20: qty 5

## 2016-08-20 MED ORDER — GUAIFENESIN ER 600 MG PO TB12: 600.0000 mg | ORAL_TABLET | Freq: Two times a day (BID) | ORAL | 0 refills | Status: DC

## 2016-08-20 MED ORDER — CARVEDILOL 3.125 MG PO TABS: 3.1250 mg | ORAL_TABLET | Freq: Two times a day (BID) | ORAL | 3 refills | Status: DC

## 2016-08-20 MED ORDER — HEPARIN BOLUS VIA INFUSION
2000.0000 [IU] | Freq: Once | INTRAVENOUS | Status: AC
  Administered 2016-08-20: 2000 [IU] via INTRAVENOUS
  Filled 2016-08-20: qty 2000

## 2016-08-20 NOTE — Discharge Summary (Signed)
Physician Discharge Summary  Patient ID: [name] MRN: [id number] DOB/AGE: [birth year] 66 y.o.  Admit date: 08/19/2016 Discharge date: 08/20/2016  Admission Diagnoses: Chest pain CAD S/P RCA stent Hypertension Dyslipidemia Tobacco use disorder H/O cocaine and marijuna abuse H/O SVT  Discharge Diagnoses:  Principle Problem: * Chest pain * Active Problems:   CAD   S/P RCA stent   Dilated cardiomyopathy   Dyslipidemia   Tobacco use disorder   H/O cocaine and marijuana abuse   H/O SVT   Upper respiratory tract infection   Leukocytosis  Discharged Condition: fair  Hospital Course: 66 year old male presented with recurrent chest pain. His cardiac enzymes were minimally elevated as demand ischemia. He underwent nuclear stress test that showed large defect without reversible ischemia but with poor LV systolic function. Small dose B-blocker and Losartan were added. If tolerated he may use Entresto in future. He had upper respiratory tract infection and was started on Azithromycin and Mucinex. He will see Dr. Terrence Dupont in 1 week.  Consults: cardiology  Significant Diagnostic Studies: labs: Normal Hgb and platelets count. Elevated WBC count. Normal LipId panel. Near normal electrolytes and BUN/Creatinine. Minimal elevation of Troponin-I. Strep screen and influenza panel were negative.  EKG- Sinus rhythm, LVH with repolarization abnormality and PVC.  Nuclear stress test : Poor LV function without reversible ischemia.  Treatments: cardiac meds: Aspirin, Atorvastatin, NTG SL, Losartan, carvedilol, amlodipine and Plavix  Discharge Exam: Blood pressure 130/63, pulse 71, temperature 99.4 F (37.4 C), temperature source Oral, resp. rate 14, height 6\' 1"  (1.854 m), weight 62.5 kg (137 lb 11.2 oz), SpO2 99 %. General appearance: alert, cooperative and appears stated age. Head: Normocephalic, atraumatic. Eyes: Brown eyes, pink conjunctiva, corneas clear. PERRL, EOM's intact.  Neck: No  adenopathy, no carotid bruit, no JVD, supple, symmetrical, trachea midline and thyroid not enlarged. Resp: Clear to auscultation bilaterally. Cardio: Regular rate and rhythm, S1, S2 normal, II/VI systolic murmur, no click, rub or gallop. GI: Soft, non-tender; bowel sounds normal; no organomegaly. Extremities: No edema, cyanosis or clubbing. Skin: Warm and dry.  Neurologic: Alert and oriented X 3, normal strength and tone. Normal coordination and gait.  Disposition: 01-Home or Self Care   Allergies as of 08/20/2016   No Known Allergies     Medication List    TAKE these medications   acetaminophen 325 MG tablet Commonly known as:  TYLENOL Take 325 mg by mouth every 6 (six) hours as needed.   amLODipine 5 MG tablet Commonly known as:  NORVASC Take 1 tablet (5 mg total) by mouth daily. What changed:  medication strength  how much to take   aspirin EC 81 MG tablet Take 81 mg by mouth daily.   atorvastatin 40 MG tablet Commonly known as:  LIPITOR Take 40 mg by mouth daily at 6 PM.   azithromycin 250 MG tablet Commonly known as:  ZITHROMAX One daily x 4 days. Start taking on:  08/21/2016   carvedilol 3.125 MG tablet Commonly known as:  COREG Take 1 tablet (3.125 mg total) by mouth 2 (two) times daily with a meal.   clopidogrel 75 MG tablet Commonly known as:  PLAVIX Take 75 mg by mouth daily.   guaiFENesin 600 MG 12 hr tablet Commonly known as:  MUCINEX Take 1 tablet (600 mg total) by mouth 2 (two) times daily.   ibuprofen 400 MG tablet Commonly known as:  ADVIL,MOTRIN Take 1 tablet (400 mg total) by mouth 3 (three) times daily. What changed:  how  much to take   losartan 25 MG tablet Commonly known as:  COZAAR Take 1 tablet (25 mg total) by mouth daily.   nitroGLYCERIN 0.4 MG SL tablet Commonly known as:  NITROSTAT Place 0.4 mg under the tongue every 5 (five) minutes as needed for chest pain.      Follow-up Information    Charolette Forward, MD. Schedule an  appointment as soon as possible for a visit in 1 week(s).   Specialty:  Cardiology Contact information: [place] [address] [place] [address] [phone]           Signed: Birdie Riddle 08/20/2016, 2:50 PM

## 2016-08-20 NOTE — Progress Notes (Signed)
ANTICOAGULATION CONSULT NOTE - Follow Up Consult  Pharmacy Consult for Heparin Indication: chest pain/ACS  No Known Allergies  Patient Measurements: Height: 6\' 1"  (185.4 cm) Weight: 139 lb 14.4 oz (63.5 kg) IBW/kg (Calculated) : 79.9  Vital Signs: Temp: 98.8 F (37.1 C) (04/07 2126) Temp Source: Oral (04/07 1509) BP: 112/63 (04/07 2126) Pulse Rate: 55 (04/07 2126)  Labs:  Recent Labs  08/19/16 0953 08/19/16 1600 08/19/16 1811 08/19/16 2156 08/20/16 0107  HGB 14.0  --   --   --  12.8*  HCT 41.4  --   --   --  38.7*  PLT 233  --   --   --  218  LABPROT  --   --   --   --  13.7  INR  --   --   --   --  1.05  HEPARINUNFRC  --   --  0.22*  0.22*  --  0.18*  CREATININE 1.38*  --   --   --  1.29*  TROPONINI  --  0.10*  --  0.03* 0.04*    Estimated Creatinine Clearance: 52.6 mL/min (A) (by C-G formula based on SCr of 1.29 mg/dL (H)).   Assessment: 66 y.o. M on heparin for r/o ACS. Heparin level remains subtherapeutic at 0.18 (actually down after rate increased). CBC stable. No issues with line or bleeding reported per RN.  Goal of Therapy:  Heparin level 0.3-0.7 units/ml Monitor platelets by anticoagulation protocol: Yes   Plan: Rebolus heparin 2000 units Increase IV heparin to 1150 units/hr Recheck heparin level in 6 hrs  Sherlon Handing, PharmD, BCPS Clinical pharmacist, pager [phone] 08/20/2016,2:07 AM

## 2016-08-20 NOTE — Progress Notes (Addendum)
ANTICOAGULATION CONSULT NOTE - Follow Up Consult  Pharmacy Consult for Heparin Indication: chest pain/ACS  No Known Allergies  Patient Measurements: Height: 6\' 1"  (185.4 cm) Weight: 137 lb 11.2 oz (62.5 kg) IBW/kg (Calculated) : 79.9  Vital Signs: Temp: 98.9 F (37.2 C) (04/08 0444) BP: 119/68 (04/08 0905) Pulse Rate: 68 (04/08 0905)  Labs:  Recent Labs  08/19/16 0953 08/19/16 1600 08/19/16 1811 08/19/16 2156 08/20/16 0107 08/20/16 0956  HGB 14.0  --   --   --  12.8*  --   HCT 41.4  --   --   --  38.7*  --   PLT 233  --   --   --  218  --   LABPROT  --   --   --   --  13.7  --   INR  --   --   --   --  1.05  --   HEPARINUNFRC  --   --  0.22*  0.22*  --  0.18* 0.33  CREATININE 1.38*  --   --   --  1.29*  --   TROPONINI  --  0.10*  --  0.03* 0.04*  --     Estimated Creatinine Clearance: 51.8 mL/min (A) (by C-G formula based on SCr of 1.29 mg/dL (H)).   Assessment: 66 y.o. M on heparin for r/o ACS. Heparin level therapeutic at 0.33. CBC stable. No bleeding noted. Per cards, for nuclear stress test.   Goal of Therapy:  Heparin level 0.3-0.7 units/ml Monitor platelets by anticoagulation protocol: Yes   Plan: Heparin gtt at 1150 unit/hr  Will get confirmatory level in 6 hours  Heparin level and CBC daily Monitor for s/s bleeding   Argie Ramming, PharmD Pharmacy Resident  Pager [phone] 08/20/16 11:06 AM

## 2016-08-22 LAB — CULTURE, GROUP A STREP (THRC)

## 2017-02-13 ENCOUNTER — Emergency Department (HOSPITAL_COMMUNITY): Payer: Self-pay

## 2017-02-13 ENCOUNTER — Inpatient Hospital Stay (HOSPITAL_COMMUNITY)
Admission: EM | Admit: 2017-02-13 | Discharge: 2017-02-16 | DRG: 065 | Disposition: A | Discharge status: Home / Self Care | Payer: Self-pay | Attending: Family Medicine | Admitting: Family Medicine

## 2017-02-13 ENCOUNTER — Encounter (HOSPITAL_COMMUNITY): Payer: Self-pay

## 2017-02-13 DIAGNOSIS — Z7902 Long term (current) use of antithrombotics/antiplatelets: Secondary | ICD-10-CM

## 2017-02-13 DIAGNOSIS — I249 Acute ischemic heart disease, unspecified: Secondary | ICD-10-CM | POA: Diagnosis present

## 2017-02-13 DIAGNOSIS — I252 Old myocardial infarction: Secondary | ICD-10-CM

## 2017-02-13 DIAGNOSIS — I119 Hypertensive heart disease without heart failure: Secondary | ICD-10-CM | POA: Diagnosis present

## 2017-02-13 DIAGNOSIS — M62838 Other muscle spasm: Secondary | ICD-10-CM | POA: Diagnosis present

## 2017-02-13 DIAGNOSIS — R531 Weakness: Secondary | ICD-10-CM

## 2017-02-13 DIAGNOSIS — D638 Anemia in other chronic diseases classified elsewhere: Secondary | ICD-10-CM | POA: Diagnosis present

## 2017-02-13 DIAGNOSIS — F17210 Nicotine dependence, cigarettes, uncomplicated: Secondary | ICD-10-CM | POA: Diagnosis present

## 2017-02-13 DIAGNOSIS — M549 Dorsalgia, unspecified: Secondary | ICD-10-CM

## 2017-02-13 DIAGNOSIS — I10 Essential (primary) hypertension: Secondary | ICD-10-CM

## 2017-02-13 DIAGNOSIS — I69351 Hemiplegia and hemiparesis following cerebral infarction affecting right dominant side: Secondary | ICD-10-CM

## 2017-02-13 DIAGNOSIS — E785 Hyperlipidemia, unspecified: Secondary | ICD-10-CM | POA: Diagnosis present

## 2017-02-13 DIAGNOSIS — I6350 Cerebral infarction due to unspecified occlusion or stenosis of unspecified cerebral artery: Principal | ICD-10-CM | POA: Diagnosis present

## 2017-02-13 DIAGNOSIS — I219 Acute myocardial infarction, unspecified: Secondary | ICD-10-CM | POA: Diagnosis present

## 2017-02-13 DIAGNOSIS — Z681 Body mass index (BMI) 19 or less, adult: Secondary | ICD-10-CM

## 2017-02-13 DIAGNOSIS — R270 Ataxia, unspecified: Secondary | ICD-10-CM | POA: Diagnosis present

## 2017-02-13 DIAGNOSIS — Z23 Encounter for immunization: Secondary | ICD-10-CM

## 2017-02-13 DIAGNOSIS — Z79899 Other long term (current) drug therapy: Secondary | ICD-10-CM

## 2017-02-13 DIAGNOSIS — R29702 NIHSS score 2: Secondary | ICD-10-CM | POA: Diagnosis present

## 2017-02-13 DIAGNOSIS — F1210 Cannabis abuse, uncomplicated: Secondary | ICD-10-CM | POA: Diagnosis present

## 2017-02-13 DIAGNOSIS — M545 Low back pain: Secondary | ICD-10-CM | POA: Diagnosis present

## 2017-02-13 DIAGNOSIS — F17200 Nicotine dependence, unspecified, uncomplicated: Secondary | ICD-10-CM | POA: Diagnosis present

## 2017-02-13 DIAGNOSIS — E440 Moderate protein-calorie malnutrition: Secondary | ICD-10-CM | POA: Diagnosis present

## 2017-02-13 DIAGNOSIS — M1990 Unspecified osteoarthritis, unspecified site: Secondary | ICD-10-CM | POA: Diagnosis present

## 2017-02-13 DIAGNOSIS — Z955 Presence of coronary angioplasty implant and graft: Secondary | ICD-10-CM

## 2017-02-13 DIAGNOSIS — I2510 Atherosclerotic heart disease of native coronary artery without angina pectoris: Secondary | ICD-10-CM | POA: Diagnosis present

## 2017-02-13 DIAGNOSIS — R001 Bradycardia, unspecified: Secondary | ICD-10-CM

## 2017-02-13 DIAGNOSIS — I517 Cardiomegaly: Secondary | ICD-10-CM | POA: Diagnosis present

## 2017-02-13 HISTORY — DX: Transient cerebral ischemic attack, unspecified: G45.9

## 2017-02-13 HISTORY — DX: Gastro-esophageal reflux disease without esophagitis: K21.9

## 2017-02-13 LAB — I-STAT CHEM 8, ED
BUN: 11 mg/dL (ref 6–20)
CREATININE: 0.9 mg/dL (ref 0.61–1.24)
Calcium, Ion: 1.19 mmol/L (ref 1.15–1.40)
Chloride: 104 mmol/L (ref 101–111)
Glucose, Bld: 81 mg/dL (ref 65–99)
HEMATOCRIT: 43 % (ref 39.0–52.0)
HEMOGLOBIN: 14.6 g/dL (ref 13.0–17.0)
POTASSIUM: 3.9 mmol/L (ref 3.5–5.1)
Sodium: 140 mmol/L (ref 135–145)
TCO2: 26 mmol/L (ref 22–32)

## 2017-02-13 LAB — IRON AND TIBC
Iron: 115 ug/dL (ref 45–182)
SATURATION RATIOS: 45 % — AB (ref 17.9–39.5)
TIBC: 256 ug/dL (ref 250–450)
UIBC: 141 ug/dL

## 2017-02-13 LAB — RAPID URINE DRUG SCREEN, HOSP PERFORMED
AMPHETAMINES: NOT DETECTED
BENZODIAZEPINES: NOT DETECTED
Barbiturates: NOT DETECTED
COCAINE: NOT DETECTED
Opiates: NOT DETECTED
Tetrahydrocannabinol: NOT DETECTED

## 2017-02-13 LAB — BASIC METABOLIC PANEL
Anion gap: 8 (ref 5–15)
BUN: 8 mg/dL (ref 6–20)
CALCIUM: 9 mg/dL (ref 8.9–10.3)
CO2: 24 mmol/L (ref 22–32)
CREATININE: 1 mg/dL (ref 0.61–1.24)
Chloride: 105 mmol/L (ref 101–111)
Glucose, Bld: 83 mg/dL (ref 65–99)
Potassium: 3.9 mmol/L (ref 3.5–5.1)
SODIUM: 137 mmol/L (ref 135–145)

## 2017-02-13 LAB — CBC
HCT: 38.7 % — ABNORMAL LOW (ref 39.0–52.0)
Hemoglobin: 12.6 g/dL — ABNORMAL LOW (ref 13.0–17.0)
MCH: 23.5 pg — ABNORMAL LOW (ref 26.0–34.0)
MCHC: 32.6 g/dL (ref 30.0–36.0)
MCV: 72.1 fL — ABNORMAL LOW (ref 78.0–100.0)
PLATELETS: 254 10*3/uL (ref 150–400)
RBC: 5.37 MIL/uL (ref 4.22–5.81)
RDW: 14.7 % (ref 11.5–15.5)
WBC: 6.8 10*3/uL (ref 4.0–10.5)

## 2017-02-13 LAB — PROTIME-INR
INR: 1.08
Prothrombin Time: 13.9 seconds (ref 11.4–15.2)

## 2017-02-13 LAB — DIFFERENTIAL
Basophils Absolute: 0.1 10*3/uL (ref 0.0–0.1)
Basophils Relative: 1 %
EOS PCT: 5 %
Eosinophils Absolute: 0.3 10*3/uL (ref 0.0–0.7)
LYMPHS ABS: 2.3 10*3/uL (ref 0.7–4.0)
LYMPHS PCT: 38 %
MONO ABS: 0.5 10*3/uL (ref 0.1–1.0)
MONOS PCT: 8 %
Neutro Abs: 2.9 10*3/uL (ref 1.7–7.7)
Neutrophils Relative %: 48 %

## 2017-02-13 LAB — URINALYSIS, ROUTINE W REFLEX MICROSCOPIC
BILIRUBIN URINE: NEGATIVE
Bacteria, UA: NONE SEEN
GLUCOSE, UA: NEGATIVE mg/dL
Hgb urine dipstick: NEGATIVE
KETONES UR: NEGATIVE mg/dL
NITRITE: NEGATIVE
Protein, ur: NEGATIVE mg/dL
Specific Gravity, Urine: 1.013 (ref 1.005–1.030)
Squamous Epithelial / LPF: NONE SEEN
pH: 6 (ref 5.0–8.0)

## 2017-02-13 LAB — VITAMIN B12: Vitamin B-12: 260 pg/mL (ref 180–914)

## 2017-02-13 LAB — I-STAT TROPONIN, ED: Troponin i, poc: 0.01 ng/mL (ref 0.00–0.08)

## 2017-02-13 LAB — ETHANOL

## 2017-02-13 LAB — FERRITIN: FERRITIN: 71 ng/mL (ref 24–336)

## 2017-02-13 LAB — APTT: aPTT: 30 seconds (ref 24–36)

## 2017-02-13 LAB — RETICULOCYTES
RBC.: 5.23 MIL/uL (ref 4.22–5.81)
Retic Count, Absolute: 41.8 10*3/uL (ref 19.0–186.0)
Retic Ct Pct: 0.8 % (ref 0.4–3.1)

## 2017-02-13 LAB — TROPONIN I

## 2017-02-13 LAB — FOLATE: Folate: 14.1 ng/mL (ref >5.9)

## 2017-02-13 MED ORDER — HYDRALAZINE HCL 20 MG/ML IJ SOLN: 5.0000 mg | Freq: Three times a day (TID) | INTRAMUSCULAR | Status: DC | PRN

## 2017-02-13 MED ORDER — ACETAMINOPHEN 325 MG PO TABS: 650.0000 mg | ORAL_TABLET | ORAL | Status: DC | PRN

## 2017-02-13 MED ORDER — HEPARIN SODIUM (PORCINE) 5000 UNIT/ML IJ SOLN
5000.0000 [IU] | Freq: Three times a day (TID) | INTRAMUSCULAR | Status: DC
  Administered 2017-02-13 – 2017-02-16 (×9): 5000 [IU] via SUBCUTANEOUS
  Filled 2017-02-13 (×9): qty 1

## 2017-02-13 MED ORDER — STROKE: EARLY STAGES OF RECOVERY BOOK
Freq: Once | Status: AC
  Administered 2017-02-14: 18:00:00
  Filled 2017-02-13 (×2): qty 1

## 2017-02-13 MED ORDER — SODIUM CHLORIDE 0.9 % IV SOLN: INTRAVENOUS | Status: DC

## 2017-02-13 MED ORDER — SENNOSIDES-DOCUSATE SODIUM 8.6-50 MG PO TABS
1.0000 | ORAL_TABLET | Freq: Every evening | ORAL | Status: DC | PRN
  Administered 2017-02-15: 1 via ORAL
  Filled 2017-02-13: qty 1

## 2017-02-13 MED ORDER — ASPIRIN 325 MG PO TABS
325.0000 mg | ORAL_TABLET | Freq: Every day | ORAL | Status: DC
Start: 2017-02-13 — End: 2017-02-14
  Administered 2017-02-14: 325 mg via ORAL
  Filled 2017-02-13: qty 1

## 2017-02-13 MED ORDER — IOPAMIDOL (ISOVUE-370) INJECTION 76%
INTRAVENOUS | Status: AC
  Administered 2017-02-13: 100 mL
  Filled 2017-02-13: qty 100

## 2017-02-13 MED ORDER — ACETAMINOPHEN 650 MG RE SUPP: 650.0000 mg | RECTAL | Status: DC | PRN

## 2017-02-13 MED ORDER — ACETAMINOPHEN 160 MG/5ML PO SOLN: 650.0000 mg | ORAL | Status: DC | PRN

## 2017-02-13 NOTE — ED Triage Notes (Signed)
Pt presents for evaluation of sudden onset weakness to R side and back pain. Pt reports was at work when he suddenly felt numbness and weakness to R arm and leg. Pt reports weakness has improved but numbness still present. Pt is able to freely move R side, no focal neuro deficits in triage.

## 2017-02-13 NOTE — Progress Notes (Signed)
[name] is a 66 y.o. male patient admitted from ED awake, alert - oriented  X 4 - no acute distress noted.  VSS - Blood pressure (!) 147/62, pulse (!) 50, temperature 98.5 F (36.9 C), temperature source Oral, resp. rate 16, height 6\' 1"  (1.854 m), weight 30.2 kg (66 lb 9.6 oz), SpO2 100 %.    IV in place, occlusive dsg intact without redness.  Orientation to room, and floor completed with information packet given to patient/family.  Patient declined safety video at this time.  Admission INP armband ID verified with patient/family, and in place.   SR up x 2, fall assessment complete, with patient and family able to verbalize understanding of risk associated with falls, and verbalized understanding to call nsg before up out of bed.  Call light within reach, patient able to voice, and demonstrate understanding.  Skin, clean-dry- intact without evidence of bruising, or skin tears.   No evidence of skin break down noted on exam.     Will cont to eval and treat per MD orders.  Dorris Carnes, RN 02/13/2017 7:03 PM

## 2017-02-13 NOTE — Consult Note (Addendum)
Requesting Physician: Dr. Dayna Barker    Chief Complaint: right sided numbness  History obtained from:  Patient     HPI:                                                                                                                                         [name] is an 66 y.o. male presenting for evaluation of sudden onset weakness to R side and back pain.  Pt reports was at work when he suddenly felt numbness and weakness to R arm and leg.  Pt reports weakness has improved but numbness still present.  Currently he has subjective sensory findings and mild right leg drift.   History of cocaine abuse but quit 14 years ago and last THC was 1 month and still smokes. Vehemently denies any current cocaine use.  Date last known well: Date: 02/13/2017 Time last known well: Time: 08:30 tPA Given: No: minimal symptoms NIHSS 2         Modified Rankin: Rankin Score=0  Past Medical History:  Diagnosis Date  . Angina   . Arthritis   . Chronic lower back pain   . Coronary artery disease   . DJD (degenerative joint disease)   . High cholesterol   . Hypertension   . 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 MI (myocardial infarction) ([facility]) 2011  . Peptic ulcer disease   . Pneumonia    "when I was younger"  . Sinus bradycardia 09/28/11  . SVT (supraventricular tachycardia) ([facility])   . Walking pneumonia ~ 2009    Past Surgical History:  Procedure Laterality Date  . CARDIAC CATHETERIZATION    . COLECTOMY  1990's   "ulcer burst; took out part of my colon"  . CORONARY ANGIOPLASTY WITH STENT PLACEMENT  08/2006   "1" cypher drug eluting stent ok for up to 3T   . LEFT HEART CATHETERIZATION WITH CORONARY ANGIOGRAM N/A 09/29/2011   Procedure: LEFT HEART CATHETERIZATION WITH CORONARY ANGIOGRAM;  Surgeon: [name], MD;  Location: [facility] CATH [facility];  Service: Cardiovascular;  Laterality: N/A;    No family history on file. Social History:  reports that he has been smoking Cigarettes.  He has a 26.00 pack-year  smoking history. He has never used smokeless tobacco. He reports that he drinks about 7.2 oz of alcohol per week . He reports that he uses drugs, including Marijuana and Cocaine.  Allergies: No Known Allergies  Medications:  Current Facility-Administered Medications  Medication Dose Route Frequency Provider Last Rate Last Dose  . iopamidol (ISOVUE-370) 76 % injection            Current Outpatient Prescriptions  Medication Sig Dispense Refill  . acetaminophen (TYLENOL) 325 MG tablet Take 325 mg by mouth every 6 (six) hours as needed.    Marland Kitchen amLODipine (NORVASC) 5 MG tablet Take 1 tablet (5 mg total) by mouth daily. 30 tablet 3  . aspirin EC 81 MG tablet Take 81 mg by mouth daily.    Marland Kitchen atorvastatin (LIPITOR) 40 MG tablet Take 40 mg by mouth daily at 6 PM.     . azithromycin (ZITHROMAX) 250 MG tablet One daily x 4 days. 4 each 0  . carvedilol (COREG) 3.125 MG tablet Take 1 tablet (3.125 mg total) by mouth 2 (two) times daily with a meal. 60 tablet 3  . clopidogrel (PLAVIX) 75 MG tablet Take 75 mg by mouth daily.    Marland Kitchen guaiFENesin (MUCINEX) 600 MG 12 hr tablet Take 1 tablet (600 mg total) by mouth 2 (two) times daily. 30 tablet 0  . ibuprofen (ADVIL,MOTRIN) 400 MG tablet Take 1 tablet (400 mg total) by mouth 3 (three) times daily. (Patient taking differently: Take 200 mg by mouth 3 (three) times daily. ) 30 tablet 0  . losartan (COZAAR) 25 MG tablet Take 1 tablet (25 mg total) by mouth daily. 30 tablet 3  . nitroGLYCERIN (NITROSTAT) 0.4 MG SL tablet Place 0.4 mg under the tongue every 5 (five) minutes as needed for chest pain.       ROS:                                                                                                                                       History obtained from the patient  General ROS: negative for - chills, fatigue, fever, night sweats, weight  gain or weight loss Psychological ROS: negative for - behavioral disorder, hallucinations, memory difficulties, mood swings or suicidal ideation Ophthalmic ROS: negative for - blurry vision, double vision, eye pain or loss of vision ENT ROS: negative for - epistaxis, nasal discharge, oral lesions, sore throat, tinnitus or vertigo Allergy and Immunology ROS: negative for - hives or itchy/watery eyes Hematological and Lymphatic ROS: negative for - bleeding problems, bruising or swollen lymph nodes Endocrine ROS: negative for - galactorrhea, hair pattern changes, polydipsia/polyuria or temperature intolerance Respiratory ROS: negative for - cough, hemoptysis, shortness of breath or wheezing Cardiovascular ROS: negative for -  dyspnea on exertion, edema or irregular heartbeat - positive for chest pain and discomfort Gastrointestinal ROS: negative for - abdominal pain, diarrhea, hematemesis, nausea/vomiting or stool incontinence Genito-Urinary ROS: negative for - dysuria, hematuria, incontinence or urinary frequency/urgency Musculoskeletal ROS: positive for -  muscular weakness Neurological ROS: as noted in HPI Dermatological ROS: negative for rash and skin lesion changes  Neurologic Examination:  Blood pressure (!) 174/86, pulse (!) 42, temperature 98.5 F (36.9 C), temperature source Oral, resp. rate 15, height 6\' 1"  (1.854 m), weight 65.8 kg (145 lb), SpO2 100 %.  HEENT-  Normocephalic, no lesions, without obvious abnormality.  Normal external eye and conjunctiva.  Normal TM's bilaterally.  Normal auditory canals and external ears. Normal external nose, mucus membranes and septum.  Normal pharynx. Cardiovascular- S1, S2 normal, pulses palpable throughout   Lungs- chest clear, no wheezing, rales, normal symmetric air entry Abdomen- normal findings: bowel sounds normal Extremities- no  edema Lymph-no adenopathy palpable Musculoskeletal-no joint tenderness, deformity or swelling Skin-warm and dry, no hyperpigmentation, vitiligo, or suspicious lesions  Neurological Examination Mental Status: Alert, oriented but could not tell me the month, thought content appropriate.  Speech fluent without evidence of aphasia.  Able to follow 3 step commands without difficulty. Cranial Nerves: II: Discs flat bilaterally; Visual fields grossly normal,  III,IV, VI: ptosis not present, extra-ocular motions intact bilaterally, pupils equal, round, reactive to light and accommodation V,VII: smile symmetric, facial light touch sensation decreased on the right but splits midline VIII: hearing normal bilaterally IX,X: uvula rises symmetrically XI: bilateral shoulder shrug XII: midline tongue extension Motor: Right : Upper extremity   5/5    Left:     Upper extremity   5/5  Lower extremity   4/5 -vertical drift present  Lower extremity   5/5 Tone and bulk:normal tone throughout; no atrophy noted Sensory: Decreased to all modalities on the right with decreased vibration on the forehead on the right side as well. Deep Tendon Reflexes: 2+ and symmetric throughout Plantars: Right: downgoing   Left: downgoing Cerebellar: normal finger-to-nose,  and normal heel-to-shin test Gait: not tested   Lab Results: Basic Metabolic Panel:  Recent Labs Lab 02/13/17 0951  NA 137  K 3.9  CL 105  CO2 24  GLUCOSE 83  BUN 8  CREATININE 1.00  CALCIUM 9.0   CBC:  Recent Labs Lab 02/13/17 0951  WBC 6.8  HGB 12.6*  HCT 38.7*  MCV 72.1*  PLT 254   Assessment and plan discussed with with attending physician and they are in agreement.    Etta Quill PA-C [facility] [phone]  02/13/2017, 11:51 AM  Attending addendum Patient seen and examined as code stroke.  Agree with H&P above. I have interviewed the patient, performed an independent exam and review of systems and reviewed  all the images obtained. Patient complaints of right-sided heaviness in the arm. Denies any prior strokes. Denies drug use. Review of systems positive for chest pain and discomfort. I have reviewed the imaging-CT of the head and CT angiogram of the head and neck. CT of the head does not show any evidence of evolving ischemic stroke or bleed. CT angiogram does not show any emergent large vessel occlusion although it had to be repeated because the first CT angiogram was poor quality because of delayed transit of contrast.  Assessment: 66 y.o. male with past medical history of hyperlipidemia and hypertension brought in as acute code stroke for evaluation of right-sided weakness and numbness. The weakness has improved but he continues to have numbness in the right arm and the right hemibody. His examination was a little inconsistent with sharp cutoff in the midline to sensory modalities, which is not fully physiologic. Irrespective, he has risk factors for stroke and should be evaluated. Also needs evaluation for chest pain. Low NIH stroke scale has not a candidate for TPA. No large vessel occlusion - not  a candidate for endovascular therapy  Stroke Risk Factors - hyperlipidemia and hypertension   Impression Evaluate for stroke v TIA Evaluate for chest pain Evaluate for reduced cardiac output as the CT angiography of the head and neck had to be repeated because of poor contrast transit, which might be associated with decreased cardiac output. Bradycardia  Recommendations Hemoglobin A1c, fasting lipid panel MRI brain without contrast Frequent neuro checks Echo Frequent neurochecks Antiplatelet medication-aspirin 325 by mouth or 300 when necessary Risk factor modification Urinary toxicology screen Telemetry PT OT and speech therapy consults U tox Management of chest pain and bradycardia per primary team Stroke neurology will follow.  -- Amie Portland, MD [facility] [phone]  If 7pm to 7am, please call on call as listed on AMION.

## 2017-02-13 NOTE — ED Provider Notes (Signed)
[facility] DEPT Provider Note   CSN: [id number] Arrival date & time: 02/13/17  0240     History   Chief Complaint Chief Complaint  Patient presents with  . Weakness    HPI [name] is a 66 y.o. male presents to ED for evaluation of sudden onset right sided weakness, tingling and numbness from right shoulder to toes onset 0830-0840 today while at work. States his shoe and RLE felt "heavier". He had to drag his foot and hop on his left leg. Symptoms have slightly improved but numbness and heaviness to RLE remains. Also reports R low back pain. No dysuria, hematuria, fevers, h/o kidney stones. No headache, vision changes, neck pain, slurred speech, syncope, light-headedness. No h/o of CVA/TIA. PMH significant for CAD s/p MI s/p stent, HTN, HLD, tobacco abuse, marijuana and cocaine abuse. Last marijuana use 2-3 weeks ago, denies recent cocaine use.  HPI  Past Medical History:  Diagnosis Date  . Angina   . Arthritis   . Chronic lower back pain   . Coronary artery disease   . DJD (degenerative joint disease)   . High cholesterol   . Hypertension   . 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 MI (myocardial infarction) ([place]) 2011  . Peptic ulcer disease   . Pneumonia    "when I was younger"  . Sinus bradycardia 09/28/11  . SVT (supraventricular tachycardia) ([place])   . Walking pneumonia ~ 2009    Patient Active Problem List   Diagnosis Date Noted  . Chest pain 08/19/2016  . Chest pain at rest 06/01/2013  . Rhinosinusitis 02/24/2013  . Abrasion of cornea, right 07/18/2012  . Cough 07/01/2012  . Acute coronary syndrome ([place]) 09/28/2011  . MI (myocardial infarction) ([place])   . HIP PAIN, RIGHT 06/09/2010  . TOBACCO USER 02/20/2009  . Hyperlipidemia LDL goal < 70 01/12/2009  . SUBSTANCE ABUSE, MULTIPLE 01/12/2009  . HYPERTENSION 01/12/2009  . SUPRAVENTRICULAR TACHYCARDIA 01/12/2009  . GERD 01/12/2009  . DEGENERATIVE JOINT DISEASE 01/12/2009  . TOBACCO ABUSE, HX OF 01/12/2009  .  HYPERTENSION, BENIGN ESSENTIAL 07/21/2008  . CORONARY, ARTERIOSCLEROSIS 07/12/2006  . GASTRIC ULCER ACUTE WITH HEMORRHAGE 07/12/2006    Past Surgical History:  Procedure Laterality Date  . CARDIAC CATHETERIZATION    . COLECTOMY  1990's   "ulcer burst; took out part of my colon"  . CORONARY ANGIOPLASTY WITH STENT PLACEMENT  08/2006   "1" cypher drug eluting stent ok for up to 3T   . LEFT HEART CATHETERIZATION WITH CORONARY ANGIOGRAM N/A 09/29/2011   Procedure: LEFT HEART CATHETERIZATION WITH CORONARY ANGIOGRAM;  Surgeon: [name], MD;  Location: [facility];  Service: Cardiovascular;  Laterality: N/A;       Home Medications    Prior to Admission medications   Medication Sig Start Date End Date Taking? Authorizing Provider  acetaminophen (TYLENOL) 325 MG tablet Take 325 mg by mouth every 6 (six) hours as needed.    [provider]  amLODipine (NORVASC) 5 MG tablet Take 1 tablet (5 mg total) by mouth daily. 08/20/16   Dixie Dials, MD  aspirin EC 81 MG tablet Take 81 mg by mouth daily.    [provider]  atorvastatin (LIPITOR) 40 MG tablet Take 40 mg by mouth daily at 6 PM.  10/09/12   [provider]  azithromycin (ZITHROMAX) 250 MG tablet One daily x 4 days. 08/21/16   Dixie Dials, MD  carvedilol (COREG) 3.125 MG tablet Take 1 tablet (3.125 mg total) by mouth  2 (two) times daily with a meal. 08/20/16   Dixie Dials, MD  clopidogrel (PLAVIX) 75 MG tablet Take 75 mg by mouth daily. 11/19/12   [provider]  guaiFENesin (MUCINEX) 600 MG 12 hr tablet Take 1 tablet (600 mg total) by mouth 2 (two) times daily. 08/20/16   Dixie Dials, MD  ibuprofen (ADVIL,MOTRIN) 400 MG tablet Take 1 tablet (400 mg total) by mouth 3 (three) times daily. Patient taking differently: Take 200 mg by mouth 3 (three) times daily.  09/11/15   Rancour, Annie Main, MD  losartan (COZAAR) 25 MG tablet Take 1 tablet (25 mg total) by mouth daily. 08/20/16   Dixie Dials, MD    nitroGLYCERIN (NITROSTAT) 0.4 MG SL tablet Place 0.4 mg under the tongue every 5 (five) minutes as needed for chest pain.    [provider]    Family History No family history on file.  Social History Social History  Substance Use Topics  . Smoking status: Current Every Day Smoker    Packs/day: 0.50    Years: 52.00    Types: Cigarettes  . Smokeless tobacco: Never Used  . Alcohol use 7.2 oz/week    12 Cans of beer per week     Allergies   Patient has no known allergies.   Review of Systems Review of Systems  Constitutional: Negative for fever.  Eyes: Negative for visual disturbance.  Respiratory: Negative for shortness of breath.   Cardiovascular: Negative for chest pain and palpitations.  Gastrointestinal: Negative for abdominal pain, diarrhea and vomiting.  Genitourinary: Negative for dysuria.  Musculoskeletal: Positive for back pain.  Skin: Negative for color change.  Neurological: Positive for weakness and numbness. Negative for dizziness, tremors, facial asymmetry, speech difficulty, light-headedness and headaches.     Physical Exam Updated Vital Signs BP (!) 178/74   Pulse (!) 46   Temp 98.5 F (36.9 C) (Oral)   Resp 14   Ht 6\' 1"  (1.854 m)   Wt 65.8 kg (145 lb)   SpO2 100%   BMI 19.13 kg/m   Physical Exam  Constitutional: He is oriented to person, place, and time. He appears well-developed and well-nourished. No distress.  HENT:  Head: Normocephalic and atraumatic.  Nose: Nose normal.  Mouth/Throat: Oropharynx is clear and moist. No oropharyngeal exudate.  Eyes: Pupils are equal, round, and reactive to light. Conjunctivae and EOM are normal.  Neck: Normal range of motion. Neck supple.  Cardiovascular: Normal rate, regular rhythm, normal heart sounds and intact distal pulses.   No murmur heard. Pulmonary/Chest: Effort normal and breath sounds normal. No respiratory distress. He has no wheezes. He has no rales.  Abdominal: Soft. Bowel  sounds are normal. He exhibits no distension. There is no tenderness.  Musculoskeletal: Normal range of motion. He exhibits no deformity.  Lymphadenopathy:    He has no cervical adenopathy.  Neurological: He is alert and oriented to person, place, and time. A sensory deficit is present. He exhibits abnormal muscle tone.  +Decreased grip strength in R +Decreased ankle F/E strength in R +Questionable shaky right leg and drift   +Reports decreased sensation to light touch in right side from shoulder to foot compared to left Intact finger to nose test.  CN III, IV, VI PEERL and EOMs intact bilaterally CN V light touch intact in all 3 divisions of trigeminal nerve CN VII facial nerve movements intact, symmetric, bilaterally CN VIII hearing intact to finger rub, bilaterally CN IX, X no uvula deviation, symmetric soft palate rise CN  XI 5/5 SCM and trapezius strength bilaterally  CN XII Tongue midline with symmetric L/R movement A&O to self, place and time. Speech and phonation normal.  Thought process coherent.    Skin: Skin is warm and dry. Capillary refill takes less than 2 seconds.  Psychiatric: He has a normal mood and affect. His behavior is normal. Judgment and thought content normal.  Nursing note and vitals reviewed.    ED Treatments / Results  Labs (all labs ordered are listed, but only abnormal results are displayed) Labs Reviewed  CBC - Abnormal; Notable for the following:       Result Value   Hemoglobin 12.6 (*)    HCT 38.7 (*)    MCV 72.1 (*)    MCH 23.5 (*)    All other components within normal limits  BASIC METABOLIC PANEL  TROPONIN I  ETHANOL  PROTIME-INR  APTT  DIFFERENTIAL  URINALYSIS, ROUTINE W REFLEX MICROSCOPIC  RAPID URINE DRUG SCREEN, HOSP PERFORMED  I-STAT CHEM 8, ED  I-STAT TROPONIN, ED    EKG  EKG Interpretation  Date/Time:  Tuesday February 13 2017 09:45:16 EDT Ventricular Rate:  49 PR Interval:  148 QRS Duration: 86 QT Interval:  458 QTC  Calculation: 413 R Axis:   81 Text Interpretation:  Sinus bradycardia Possible Left atrial enlargement Cannot rule out Inferior infarct , age undetermined Abnormal ECG No significant change since last tracing Confirmed by Merrily Pew [phone]) on 02/13/2017 9:50:06 AM       Radiology Ct Head Code Stroke Wo Contrast  Result Date: 02/13/2017 CLINICAL DATA:  Code stroke. RIGHT-sided weakness and numbness, last seen normal at 0815 hours. EXAM: CT HEAD WITHOUT CONTRAST TECHNIQUE: Contiguous axial images were obtained from the base of the skull through the vertex without intravenous contrast. COMPARISON:  MR brain 09/11/2015. FINDINGS: Brain: No evidence for acute infarction, hemorrhage, mass lesion, hydrocephalus, or extra-axial fluid. Normal cerebral volume. No white matter disease. Vascular: No hyperdense vessel or unexpected calcification. Skull: Normal. Negative for fracture or focal lesion. Sinuses/Orbits: No acute finding. Other: None. ASPECTS [facility] Stroke Program Early CT Score) - Ganglionic level infarction (caudate, lentiform nuclei, internal capsule, insula, M1-M3 cortex): 7 - Supraganglionic infarction (M4-M6 cortex): 3 Total score (0-10 with 10 being normal): 10 IMPRESSION: 1. No acute intracranial findings. No features suggestive of large vessel occlusion. 2. ASPECTS is 10 These results were called by telephone at the time of interpretation on 02/13/2017 at 12:15 pm to Dr. Rory Percy , who verbally acknowledged these results. Electronically Signed   By: Staci Righter M.D.   On: 02/13/2017 12:19    Procedures Procedures (including critical care time)  Medications Ordered in ED Medications  iopamidol (ISOVUE-370) 76 % injection (100 mLs  Contrast Given 02/13/17 1200)     Initial Impression / Assessment and Plan / ED Course  I have reviewed the triage vital signs and the nursing notes.  Pertinent labs & imaging results that were available during my care of the patient were reviewed by me and  considered in my medical decision making (see chart for details).    Shared patient with supervising physician after my examination. On initial exam, pt had subjective decreased sensation on R, decreased grip strength and left drift concerning for TIA vs CVA. Code stroke activated. Pending lab work and imaging. Dr. Rory Percy evaluated pt prior to imaging.   Lab work and imaging so far unremarkable. Pt had questionable objective neuro deficits for ED MD and neurologist as well. Dr. Rory Percy requesting pt  be admitted for TIA work up. Also recommending ACS work up as contrast took a long time to get to brain, concerned for decreased heart function. Will place consult to unassigned for admission. Pending RUDS, U/A, CTA head and neck.  Patient, ED treatment and discharge plan was discussed with supervising physician who also evaluated the patient and is agreeable with plan.   Final Clinical Impressions(s) / ED Diagnoses   Final diagnoses:  None    New Prescriptions New Prescriptions   No medications on file     Arlean Hopping 02/13/17 1306    Mesner, Corene Cornea, MD 02/13/17 1650

## 2017-02-13 NOTE — ED Notes (Signed)
Pt's IV infiltrated while in CT.  Pt IV removed and warm compresses applied.   Pt denies pain at the site.  Mild  Swelling noted.  Will continue to monitor.

## 2017-02-13 NOTE — ED Notes (Signed)
Meds not yet verified by pharmacy.

## 2017-02-13 NOTE — ED Provider Notes (Signed)
Medical screening examination/treatment/procedure(s) were conducted as a shared visit with non-physician practitioner(s) and myself.  I personally evaluated the patient during the encounter.  Last known normal was at [phone] range. Had acute onset of right-sided weakness and numbness at that time. He apparently initially told triage it had improved however on evaluation in the room patient has persistent right-sided weak grip strength and difficulty lifting his right leg with persistent paresthesias and numbness. Code stroke activated immediately after examination. Neuro examined and patient's symptoms and signs seem to be changing. No tPA at least for now with resolving symptoms.   CRITICAL CARE Performed by: Merrily Pew Total critical care time: 35 minutes Critical care time was exclusive of separately billable procedures and treating other patients. Critical care was necessary to treat or prevent imminent or life-threatening deterioration: Neurologic. Critical care was time spent personally by me on the following activities: development of treatment plan with patient and/or surrogate as well as nursing, discussions with consultants, evaluation of patient's response to treatment, examination of patient, obtaining history from patient or surrogate, ordering and performing treatments and interventions, ordering and review of laboratory studies, ordering and review of radiographic studies, pulse oximetry and re-evaluation of patient's condition.    EKG Interpretation  Date/Time:  Tuesday February 13 2017 09:45:16 EDT Ventricular Rate:  49 PR Interval:  148 QRS Duration: 86 QT Interval:  458 QTC Calculation: 413 R Axis:   81 Text Interpretation:  Sinus bradycardia Possible Left atrial enlargement Cannot rule out Inferior infarct , age undetermined Abnormal ECG No significant change since last tracing Confirmed by Merrily Pew [phone]) on 02/13/2017 9:50:06 AM         Akin Yi, Corene Cornea,  MD 02/13/17 1651

## 2017-02-13 NOTE — Code Documentation (Signed)
66 y.o. Male with PMHx of MI, HTN, HLD and CAD who presents for evaluation of acute onset of right-sided weakness and back pain. Patient reports he was at work when the symptoms started and at time of arrival had started to resolve but numbness persists. CT revealed no acute intracranial abnormalities. ASPECTS 10. NIHSS 2. See EMR for NIHSS and code stroke times. On assessment, patient with right-sided numbness and RLE drift. IV tPA not given d/t being to mild to treat. ED bedside handoff with ED RN Caren Griffins.

## 2017-02-13 NOTE — ED Notes (Signed)
Provider at bedside

## 2017-02-13 NOTE — H&P (Signed)
History and Physical    [name] FVC:[id number] DOB: 05/27/1950 DOA: 02/13/2017   PCP: Patient, No Pcp Per   Patient coming from:  Home    Chief Complaint: Right leg weakness  HPI: [name] is a 66 y.o. male with medical history significant for chronic low back pain, CAD status post MI in 2011, history of bradycardia, hypertension, hyperlipidemia, tobacco abuse, recreational marijuana use, presenting with sudden onset of right-sided weakness and tingling, with numbness from the right shoulder to do chose, for around 8:30 this morning while at work. He reports that he is right leg feeling heavier, having to drag his foot and hop all his left leg. Since presentation, symptoms have in proved, without resolution. He reports the heaviness in the right leg remaining. He also complains of right lower back pain. He denies any urinary retention, dysuria or hematuria. He denies any fevers or chills. He denies any headaches or vision changes. He denies any neck pain. No slurred speech, or confusion. He denies any shortness of breath. He denies any chest pain or palpitations. He denies any syncope, he did feel lightheaded prior to this symptoms. He denies any history of TIA or CVA. He does have a history of stroke in his grandfather. Of note, the patient has not been taking his daily medications for blood pressure and CAD, HLD but has been taking an aspirin a day.  ED Course:  BP (!) 162/77   Pulse (!) 44   Temp 98.7 F (37.1 C)   Resp 18   Ht 6\' 1"  (1.854 m)   Wt 65.8 kg (145 lb)   SpO2 98%   BMI 19.13 kg/m    CMET normal  CBC normal GLucose 81 UA trace leukocytes UDS neg  CT angio of the head and neck pending CT head negative   Ectopic atrial bradycardia, Probable left ventricular hypertrophy, ST abnormalities that may be old when compared with 08/2016 EKG  Trop less than 0.03  Review of Systems:  As per HPI otherwise all other systems reviewed and are negative  Past Medical History:   Diagnosis Date  . Angina   . Arthritis   . Chronic lower back pain   . Coronary artery disease   . DJD (degenerative joint disease)   . High cholesterol   . Hypertension   . Inferior MI ([place]) 08/2006  . MI (myocardial infarction) ([place]) 2011  . Peptic ulcer disease   . Pneumonia    "when I was younger"  . Sinus bradycardia 09/28/11  . SVT (supraventricular tachycardia) ([place])   . Walking pneumonia ~ 2009    Past Surgical History:  Procedure Laterality Date  . CARDIAC CATHETERIZATION    . COLECTOMY  1990's   "ulcer burst; took out part of my colon"  . CORONARY ANGIOPLASTY WITH STENT PLACEMENT  08/2006   "1" cypher drug eluting stent ok for up to 3T   . LEFT HEART CATHETERIZATION WITH CORONARY ANGIOGRAM N/A 09/29/2011   Procedure: LEFT HEART CATHETERIZATION WITH CORONARY ANGIOGRAM;  Surgeon: [name], MD;  Location: [facility];  Service: Cardiovascular;  Laterality: N/A;    Social History Social History   Social History  . Marital status: Single    Spouse name: N/A  . Number of children: N/A  . Years of education: N/A   Occupational History  . Not on file.   Social History Main Topics  . Smoking status: Current Every Day Smoker    Packs/day: 0.50  Years: 52.00    Types: Cigarettes  . Smokeless tobacco: Never Used  . Alcohol use 7.2 oz/week    12 Cans of beer per week  . Drug use: Yes    Types: Marijuana, Cocaine     Comment: "last marijuana ~ 2011; cocaine ~ 2001"  . Sexual activity: Yes   Other Topics Concern  . Not on file   Social History Narrative  . No narrative on file     No Known Allergies  No family history on file.    Prior to Admission medications   Medication Sig Start Date End Date Taking? Authorizing Provider  acetaminophen (TYLENOL) 325 MG tablet Take 325 mg by mouth every 6 (six) hours as needed.    [provider]  amLODipine (NORVASC) 5 MG tablet Take 1 tablet (5 mg total) by mouth daily. 08/20/16   Dixie Dials, MD    atorvastatin (LIPITOR) 40 MG tablet Take 40 mg by mouth daily at 6 PM.  10/09/12   [provider]  azithromycin (ZITHROMAX) 250 MG tablet One daily x 4 days. 08/21/16   Dixie Dials, MD  carvedilol (COREG) 3.125 MG tablet Take 1 tablet (3.125 mg total) by mouth 2 (two) times daily with a meal. 08/20/16   Dixie Dials, MD  clopidogrel (PLAVIX) 75 MG tablet Take 75 mg by mouth daily. 11/19/12   [provider]  guaiFENesin (MUCINEX) 600 MG 12 hr tablet Take 1 tablet (600 mg total) by mouth 2 (two) times daily. 08/20/16   Dixie Dials, MD  ibuprofen (ADVIL,MOTRIN) 400 MG tablet Take 1 tablet (400 mg total) by mouth 3 (three) times daily. Patient taking differently: Take 200 mg by mouth 3 (three) times daily.  09/11/15   Rancour, Annie Main, MD  losartan (COZAAR) 25 MG tablet Take 1 tablet (25 mg total) by mouth daily. 08/20/16   Dixie Dials, MD  nitroGLYCERIN (NITROSTAT) 0.4 MG SL tablet Place 0.4 mg under the tongue every 5 (five) minutes as needed for chest pain.    [provider]    Physical Exam:  Vitals:   02/13/17 1345 02/13/17 1355 02/13/17 1400 02/13/17 1430  BP:   (!) 161/88 (!) 162/77  Pulse: (!) 40  (!) 36 (!) 44  Resp: 14  15 18   Temp:  98.7 F (37.1 C)    TempSrc:      SpO2: 100%  100% 98%  Weight:      Height:       Constitutional: NAD, calm,chronically ill appearing Eyes: PERRL, lids and conjunctivae normal ENMT: Mucous membranes are moist, without exudate or lesions  Neck: normal, supple, no masses, no thyromegaly Respiratory: clear to auscultation bilaterally, no wheezing, no crackles. Normal respiratory effort  Cardiovascular: Regular rate and rhythm,  murmur, rubs or gallops. No extremity edema. 2+ pedal pulses. No carotid bruits.  Abdomen: Soft, non tender, No hepatosplenomegaly. Bowel sounds positive.  Musculoskeletal: no clubbing / cyanosis. Moves all extremities Skin: no jaundice, No lesions.  Neurologic: slightly decreased strength in the  RUE, questionable RLE weakness, sensation normal  Psychiatric:   Alert and oriented x 3. Normal mood.   Labs on Admission: I have personally reviewed following labs and imaging studies  CBC:  Recent Labs Lab 02/13/17 0951 02/13/17 1140 02/13/17 1156  WBC 6.8  --   --   NEUTROABS  --  2.9  --   HGB 12.6*  --  14.6  HCT 38.7*  --  43.0  MCV 72.1*  --   --  PLT 254  --   --     Basic Metabolic Panel:  Recent Labs Lab 02/13/17 0951 02/13/17 1156  NA 137 140  K 3.9 3.9  CL 105 104  CO2 24  --   GLUCOSE 83 81  BUN 8 11  CREATININE 1.00 0.90  CALCIUM 9.0  --     GFR: Estimated Creatinine Clearance: 77.2 mL/min (by C-G formula based on SCr of 0.9 mg/dL).  Liver Function Tests: No results for input(s): AST, ALT, ALKPHOS, BILITOT, PROT, ALBUMIN in the last 168 hours. No results for input(s): LIPASE, AMYLASE in the last 168 hours. No results for input(s): AMMONIA in the last 168 hours.  Coagulation Profile:  Recent Labs Lab 02/13/17 1140  INR 1.08    Cardiac Enzymes:  Recent Labs Lab 02/13/17 1140  TROPONINI <0.03    BNP (last 3 results) No results for input(s): PROBNP in the last 8760 hours.  HbA1C: No results for input(s): HGBA1C in the last 72 hours.  CBG: No results for input(s): GLUCAP in the last 168 hours.  Lipid Profile: No results for input(s): CHOL, HDL, LDLCALC, TRIG, CHOLHDL, LDLDIRECT in the last 72 hours.  Thyroid Function Tests: No results for input(s): TSH, T4TOTAL, FREET4, T3FREE, THYROIDAB in the last 72 hours.  Anemia Panel: No results for input(s): VITAMINB12, FOLATE, FERRITIN, TIBC, IRON, RETICCTPCT in the last 72 hours.  Urine analysis:    Component Value Date/Time   COLORURINE STRAW (A) 02/13/2017 1249   APPEARANCEUR CLEAR 02/13/2017 1249   LABSPEC 1.013 02/13/2017 1249   PHURINE 6.0 02/13/2017 1249   GLUCOSEU NEGATIVE 02/13/2017 1249   HGBUR NEGATIVE 02/13/2017 1249   BILIRUBINUR NEGATIVE 02/13/2017 1249   KETONESUR  NEGATIVE 02/13/2017 1249   PROTEINUR NEGATIVE 02/13/2017 1249   UROBILINOGEN 0.2 11/21/2010 1200   NITRITE NEGATIVE 02/13/2017 1249   LEUKOCYTESUR TRACE (A) 02/13/2017 1249    Sepsis Labs: @LABRCNTIP (procalcitonin:4,lacticidven:4) )No results found for this or any previous visit (from the past 240 hour(s)).   Radiological Exams on Admission: Ct Head Code Stroke Wo Contrast  Result Date: 02/13/2017 CLINICAL DATA:  Code stroke. RIGHT-sided weakness and numbness, last seen normal at 0815 hours. EXAM: CT HEAD WITHOUT CONTRAST TECHNIQUE: Contiguous axial images were obtained from the base of the skull through the vertex without intravenous contrast. COMPARISON:  MR brain 09/11/2015. FINDINGS: Brain: No evidence for acute infarction, hemorrhage, mass lesion, hydrocephalus, or extra-axial fluid. Normal cerebral volume. No white matter disease. Vascular: No hyperdense vessel or unexpected calcification. Skull: Normal. Negative for fracture or focal lesion. Sinuses/Orbits: No acute finding. Other: None. ASPECTS [facility] Stroke Program Early CT Score) - Ganglionic level infarction (caudate, lentiform nuclei, internal capsule, insula, M1-M3 cortex): 7 - Supraganglionic infarction (M4-M6 cortex): 3 Total score (0-10 with 10 being normal): 10 IMPRESSION: 1. No acute intracranial findings. No features suggestive of large vessel occlusion. 2. ASPECTS is 10 These results were called by telephone at the time of interpretation on 02/13/2017 at 12:15 pm to Dr. Rory Percy , who verbally acknowledged these results. Electronically Signed   By: Staci Righter M.D.   On: 02/13/2017 12:19    EKG: Independently reviewed.  Assessment/Plan Active Problems:   Right sided weakness   Hyperlipidemia with target low density lipoprotein (LDL) cholesterol less than 70 mg/dL   TOBACCO USER   HYPERTENSION, BENIGN ESSENTIAL   Coronary atherosclerosis   Osteoarthritis   MI (myocardial infarction) (H[facility])   Bradycardia      Acute  right sided weakness and numbness.  Not  a TPA candidate as last known normal was the night prior to hospitalization. CT head negative. CTA head / neck does not show any emergent large vessel occlusion . NIH 2 . UDS neg . ETOH neg. Risk factors include HTN and HLD  Admit to Tele Obs Stroke order set  MRI/ Allow permissive HTN, Hydralazine for BP  210/100 Echo   PT/OT/SLP  lipid panel A1C Aspirin  Anticoagulation  Neuro  following, awaiting recommendations  Bradycardia, chronic, avg mid 40s-50s, currently 44. Denies CP. TN negative EKG  Ectopic atrial bradycardia, Probable left ventricular hypertrophy, ST abnormalities that may be old when compared with 08/2016 EKG  IVF  Repeat EKG q 8   Hypertension BP   162/77   Pulse   44    Patient not on meds as OP Add Hydralazine Q6 hours as needed for BP 210/110  Monitor his Loletha Grayer as above   Hyperlipidemia Not on  home statins Lipid panel  Anemia of chronic disease Hemoglobin on admission 12.6, MCV 72 .No bleeding   Repeat CBC in am  No transfusion is indicated at this time Anemia panel  Trace Leukocytes in Urine,neg nitrites,  WBC is 6.8 . Afebrile   F/u urine culture Will hold antibiotics for now Follow CBC in am  IVF   Tobacco abuse -  Nicotine patch vs gum was offered, patient declined counseled cessation    DVT prophylaxis:  heparin Code Status:    Full  Family Communication:  Discussed with patient Disposition Plan: Expect patient to be discharged to home after condition improves. Patient will need to follow up with PCP and Cards (Dr. Doylene Canard)  upon discharge Consults called:    Neurology per EDP Admission status: Tele Obs    Rondel Jumbo, PA-C [facility]   02/13/2017, 4:01 PM

## 2017-02-14 ENCOUNTER — Other Ambulatory Visit (HOSPITAL_COMMUNITY): Payer: Self-pay

## 2017-02-14 ENCOUNTER — Observation Stay (HOSPITAL_COMMUNITY): Payer: Self-pay

## 2017-02-14 ENCOUNTER — Encounter (HOSPITAL_COMMUNITY): Payer: Self-pay | Admitting: General Practice

## 2017-02-14 DIAGNOSIS — I639 Cerebral infarction, unspecified: Secondary | ICD-10-CM

## 2017-02-14 DIAGNOSIS — E440 Moderate protein-calorie malnutrition: Secondary | ICD-10-CM | POA: Insufficient documentation

## 2017-02-14 LAB — HEMOGLOBIN A1C
HEMOGLOBIN A1C: 5.8 % — AB (ref 4.8–5.6)
MEAN PLASMA GLUCOSE: 119.76 mg/dL

## 2017-02-14 LAB — LIPID PANEL
CHOL/HDL RATIO: 2.4 ratio
Cholesterol: 153 mg/dL (ref 0–200)
HDL: 64 mg/dL (ref >40)
LDL CALC: 78 mg/dL (ref 0–99)
Triglycerides: 56 mg/dL (ref <150)
VLDL: 11 mg/dL (ref 0–40)

## 2017-02-14 MED ORDER — METHOCARBAMOL 750 MG PO TABS
750.0000 mg | ORAL_TABLET | Freq: Three times a day (TID) | ORAL | Status: DC
  Administered 2017-02-14 – 2017-02-16 (×7): 750 mg via ORAL
  Filled 2017-02-14 (×7): qty 1

## 2017-02-14 MED ORDER — NICOTINE 21 MG/24HR TD PT24
21.0000 mg | MEDICATED_PATCH | Freq: Every day | TRANSDERMAL | Status: DC
  Administered 2017-02-14 – 2017-02-16 (×3): 21 mg via TRANSDERMAL
  Filled 2017-02-14 (×3): qty 1

## 2017-02-14 MED ORDER — PNEUMOCOCCAL VAC POLYVALENT 25 MCG/0.5ML IJ INJ
0.5000 mL | INJECTION | INTRAMUSCULAR | Status: AC
  Administered 2017-02-15: 0.5 mL via INTRAMUSCULAR
  Filled 2017-02-14: qty 0.5

## 2017-02-14 MED ORDER — CLOPIDOGREL BISULFATE 75 MG PO TABS
75.0000 mg | ORAL_TABLET | Freq: Every day | ORAL | Status: DC
  Administered 2017-02-14 – 2017-02-16 (×3): 75 mg via ORAL
  Filled 2017-02-14 (×3): qty 1

## 2017-02-14 NOTE — Progress Notes (Signed)
Initial Nutrition Assessment  DOCUMENTATION CODES:   Non-severe (moderate) malnutrition in context of chronic illness  INTERVENTION:   -Ensure Enlive po BID, each supplement provides 350 kcal and 20 grams of protein  NUTRITION DIAGNOSIS:   Malnutrition (Mild) related to chronic illness (possible stroke) as evidenced by energy intake < or equal to 75% for > or equal to 1 month, mild depletion of body fat, moderate depletion of body fat, mild depletion of muscle mass, moderate depletions of muscle mass.  GOAL:   Patient will meet greater than or equal to 90% of their needs  MONITOR:   PO intake, Supplement acceptance, Labs, Weight trends, Skin, I & O's  REASON FOR ASSESSMENT:   Malnutrition Screening Tool    ASSESSMENT:   [name] is a 66 y.o. male who presents with stroke like sx.  Pt admitted with stroke like symptoms and acute rt sided weakness and numbness.   Spoke with pt at bedside, who reports concern over weight loss that has occurred over the past month. He estimates UBW around 150-155#; he started noting wt loss around the past month, but is concerned as he is unsure why weight loss is occurring. Pt reports he typically consumes 4 meals per day (Snacks TID during work that consist either of hot chocolate and potato chips or peanut butter and crackers, large meal at home consisting of fried chicken, mashed potatoes, and green beans). 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 He reports struggling to eat due to work schedule (he gets only 15 minute breaks).   Reviewed wt hx, which reveals UBW of around 145-150#. Pt has gained wt within the past 6 months.   Nutrition-Focused physical exam completed. Findings are mild to moderate fat depletion, mild to moderate muscle depletion, and no edema. Pt also reports decreased energy and loose fitting clothes. Her shares his fiance is  also concerned about his change in appearance.   Discussed importance of good meal and supplement intake to promote healing. Pt interested in all interventions to help prevent wt loss. Spent time discussing with pt high protein snacks that pt can consume during work breaks. He also is requesting Ensure supplements, which RD will order.  Medications reviewed.   Labs reviewed.   Diet Order:  Diet regular Room service appropriate? Yes; Fluid consistency: Thin  Skin:  Reviewed, no issues  Last BM:  PTA  Height:   Ht Readings from Last 1 Encounters:  02/13/17 6\' 1"  (1.854 m)    Weight:   Wt Readings from Last 1 Encounters:  02/13/17 145 lb (65.8 kg)    Ideal Body Weight:  83.6 kg  BMI:  Body mass index is 19.13 kg/m.  Estimated Nutritional Needs:   Kcal:  1950-2150  Protein:  95-110 grams  Fluid:  1.9-2.1 L  EDUCATION NEEDS:   Education needs addressed  Karron Alvizo A. Jimmye Norman, RD, LDN, CDE Pager: [phone] After hours Pager: [phone]

## 2017-02-14 NOTE — Progress Notes (Signed)
STROKE TEAM PROGRESS NOTE   HISTORY OF PRESENT ILLNESS (per record) [name] is an 66 y.o. male presenting for evaluation of sudden onset weakness to R side and back pain.  Pt reports was at work when he suddenly felt numbness and weakness to R arm and leg.  Pt reports weakness has improved but numbness still present.  Currently he has subjective sensory findings and mild right leg drift.   History of cocaine abuse but quit 14 years ago and last THC was 1 month and still smokes. Vehemently denies any current cocaine use.  Date last known well: Date: 02/13/2017 Time last known well: Time: 08:30 tPA Given: No: minimal symptoms NIHSS 2                                                                                               Modified Rankin: Rankin Score=0   SUBJECTIVE (INTERVAL HISTORY) His physical therapist is at the bedside is at the bedside. Patient feels is getting better but is not back to baseline. He denies any prior history of strokes. He has cut back smoking to 3 cigarettes per day.   OBJECTIVE Temp:  [97.8 F (36.6 C)-98.4 F (36.9 C)] 98.4 F (36.9 C) (10/03 1217) Pulse Rate:  [44-51] 49 (10/03 1217) Cardiac Rhythm: Sinus bradycardia (10/02 2257) Resp:  [17-18] 18 (10/03 1217) BP: (109-163)/(46-72) 163/72 (10/03 1217) SpO2:  [97 %-99 %] 99 % (10/03 1217)  CBC:  Recent Labs Lab 02/13/17 0951 02/13/17 1140 02/13/17 1156  WBC 6.8  --   --   NEUTROABS  --  2.9  --   HGB 12.6*  --  14.6  HCT 38.7*  --  43.0  MCV 72.1*  --   --   PLT 254  --   --     Basic Metabolic Panel:  Recent Labs Lab 02/13/17 0951 02/13/17 1156  NA 137 140  K 3.9 3.9  CL 105 104  CO2 24  --   GLUCOSE 83 81  BUN 8 11  CREATININE 1.00 0.90  CALCIUM 9.0  --     Lipid Panel:    Component Value Date/Time   CHOL 153 02/14/2017 0536   TRIG 56 02/14/2017 0536   HDL 64 02/14/2017 0536   CHOLHDL 2.4 02/14/2017 0536   VLDL 11 02/14/2017 0536   LDLCALC 78 02/14/2017 0536    HgbA1c:  Lab Results  Component Value Date   HGBA1C 5.8 (H) 02/14/2017   Urine Drug Screen:    Component Value Date/Time   LABOPIA NONE DETECTED 02/13/2017 1249   COCAINSCRNUR NONE DETECTED 02/13/2017 1249   LABBENZ NONE DETECTED 02/13/2017 1249   AMPHETMU NONE DETECTED 02/13/2017 1249   THCU NONE DETECTED 02/13/2017 1249   LABBARB NONE DETECTED 02/13/2017 1249    Alcohol Level     Component Value Date/Time   ETH <10 02/13/2017 1140    IMAGING Ct Angio Head W Or Wo Contrast Ct Angio Neck W Or Wo Contrast 02/13/2017 IMPRESSION: Minor atheromatous change, but no flow reducing intracranial or extracranial stenosis. Specifically, no signs of large vessel occlusion.  Dg Lumbar  Spine 2-3 Views 02/14/2017 IMPRESSION: 1. Mild degenerative changes of the lower lumbar spine, similar to prior study. No acute osseous abnormality. Electronically Signed   By: Titus Dubin M.D.   On: 02/14/2017 16:49   Mr Brain Wo Contrast 02/14/2017 IMPRESSION: 1. Tiny pontine acute versus subacute infarct. 2. Mild chronic small vessel ischemic disease.  Ct Head Code Stroke Wo Contrast 02/13/2017 IMPRESSION: 1. No acute intracranial findings. No features suggestive of large vessel occlusion. 2. ASPECTS is 10    PHYSICAL EXAM  Pleasant middle-aged African-American male currently not in distress. . Afebrile. Head is nontraumatic. Neck is supple without bruit.    Cardiac exam no murmur or gallop. Lungs are clear to auscultation. Distal pulses are well felt. Neurological exam :  Awake alert oriented 3 with normal speech and language function. No aphasia, apraxia dysarthria. Follows commands well. Extraocular moments are full range without nystagmus. Fundi were not visualized. Vision acuity and fields seem adequate. Face is symmetric without weakness. Tongue is midline. Motor system exam revealed no upper extremity drift. Mild weakness of right lower extremity 4/5. Weakness of right grip and intrinsic  hand muscles. Fine finger movements are diminished on the right. Orbits left over right upper extremity. Mild impaired right finger-to-nose and knee to heel coordination. Sensation is diminished on the right compared to the left. Gait mildly ataxic and dragging his left side. ASSESSMENT/PLAN [name] is a 66 y.o. male with history of CAD s/p MI 2011, hypertension, hyperlipidemia, tobacco use presenting with right sided weakness and numbness from shoulder to toes. He did not receive IV t-PA due to minimal persistent symptoms.   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 Yes; Fluid consistency: Thin  aspirin 325 mg daily prior to admission, now on clopidogrel 75 mg daily  Patient counseled to be compliant with his antithrombotic medications  Ongoing aggressive stroke risk factor management  Therapy recommendations:  PT/OT pending  Disposition:  Pending  Hypertension  Unstable, 130s-160s SBP today  Permissive hypertension (OK if < 220/120) but gradually normalize in 5-7 days  Long-term BP goal normotensive  Hyperlipidemia  Home meds: Formerly atorvastatin unclear if still taking  LDL 78, goal < 70  Add atorvastatin 40mg   Continue statin at discharge  Diabetes  HgbA1c 5.8%, goal < 7.0  Controlled  Other Stroke Risk Factors  Advanced age  Cigarette smoker 3 cigarettes/day, advised to stop smoking   Coronary artery disease  Other Active Problems  -  Hospital day # 0  I have personally examined this patient, reviewed notes, independently viewed imaging studies,  participated in medical decision making and plan of care.ROS completed by me personally and pertinent positives fully documented  I have made any additions or clarifications directly to the above note. He presented with right-sided weakness numbness and ataxia due to small left pontine infarct from small vessel disease. Recommend change aspirin to Plavix and quit smoking. Continue ongoing stroke workup and aggressive risk factor modification. Discussed with patient, physical therapist and Dr. Quincy Simmonds. Greater than 50% time during this 25 minute visit was spent on counseling and coordination of  care about his  [place] stroke, need for aggressive risk factor modification and answered questions.  Antony Contras, MD Medical Director [facility] Pager: [phone] 02/14/2017 5:37 PM   To contact Stroke Continuity provider, please refer to [email or web address] After hours, contact General Neurology

## 2017-02-14 NOTE — Progress Notes (Signed)
Evaluation Note  Pt admitted with/for stroke. Presently, pt needs min assist for most mobility and his gait is moderately unsteady.   Pt currently limited functionally due to the problems listed. ( See problems list.)   Pt will benefit from PT to maximize function and safety in order to get ready for next venue listed below.   02/14/17 1300  PT Visit Information  Last PT Received On 02/14/17  Assistance Needed +1  History of Present Illness Pt is a 66 y.o. male with medical history significant for chronic low back pain, CAD status post MI in 2011, history of bradycardia, hypertension, hyperlipidemia, tobacco abuse, recreational marijuana use, presenting with sudden onset of right-sided weakness and numbness/tingling. Since presentation, symptoms have in improved, without resolution. He reports the heaviness in the right leg remaining. 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  Precautions  Precautions Fall  Home Living  Family/patient expects to be discharged to: Private residence  Living Arrangements Spouse/significant other;Children;Parent  Available Help at Discharge Family;Friend(s);Available PRN/intermittently (pt states someone will be around to help if needed)  Type of [place] to enter  Entrance Stairs-Number of Steps several  Entrance Stairs-Rails Right;Left  Home Layout Two level;Able to live on main level with bedroom/bathroom  Alternate Level Stairs-Number of Steps flights  Alternate Level Stairs-Rails Right;Left  Bathroom Shower/Tub Tub/shower unit  Automotive engineer Grab bars - tub/shower  Lives With Significant other  Prior Function  Level of Independence Independent  Comments works, drives, runs errands, helps with household  Communication  Communication No difficulties  Pain Assessment  Pain Assessment Faces  Faces Pain Scale 0  Cognition  Arousal/Alertness Awake/alert  Behavior During Therapy WFL for  tasks assessed/performed  Overall Cognitive Status Within Functional Limits for tasks assessed  Upper Extremity Assessment  Upper Extremity Assessment Defer to OT evaluation  Lower Extremity Assessment  Lower Extremity Assessment RLE deficits/detail;LLE deficits/detail  RLE Deficits / Details isolated movement against gravity, uncoordinated with numbness and pins/needles  RLE Sensation decreased light touch  RLE Coordination decreased fine motor  LLE Deficits / Details WFL  Bed Mobility  Overal bed mobility Needs Assistance  Bed Mobility Supine to Sit;Sit to Supine  Supine to sit Min guard  Sit to supine Supervision  General bed mobility comments slow and effortful to move to EOB, use of UE's, but no rail or assist.  Easier scooting in bed and getting from sit to supine  Transfers  Overall transfer level Needs assistance  Transfers Sit to/from Stand;Stand Pivot Transfers  Sit to Stand Min assist  Stand pivot transfers Min assist  General transfer comment use of hands and transition uncoordinated  Ambulation/Gait  Ambulation/Gait assistance Min assist  Ambulation Distance (Feet) 70 Feet  Assistive device 1 person hand held assist (rail)  Gait Pattern/deviations Step-through pattern;Decreased step length - right;Decreased step length - left;Decreased stance time - right;Decreased stride length;Wide base of support  General Gait Details hemiparetic gait with minimal toe off and flat foot contact.  Improving with heel/toe practice, but unccordinated, needing rail for stability and min stability assist.  Gait velocity interpretation Below normal speed for age/gender  Modified Rankin (Stroke Patients Only)  Pre-Morbid Rankin Score 0  Modified Rankin 4  Balance  Overall balance assessment Needs assistance  Sitting balance-Leahy Scale Good  Standing balance-Leahy Scale Fair  Standing balance comment static stance without assist, but unable to accept challenge without LOB  PT - End of  Session  Activity  Tolerance Patient tolerated treatment well  Patient left in bed;with call bell/phone within reach;with bed alarm set  Nurse Communication Mobility status  PT Assessment  PT Recommendation/Assessment Patient needs continued PT services  PT Visit Diagnosis Unsteadiness on feet (R26.81);Other abnormalities of gait and mobility (R26.89);Hemiplegia and hemiparesis  Hemiplegia - Right/Left Right  Hemiplegia - dominant/non-dominant Dominant  Hemiplegia - caused by Cerebral infarction  PT Problem List Decreased strength;Decreased activity tolerance;Decreased balance;Decreased mobility;Decreased coordination;Decreased knowledge of use of DME;Impaired sensation  PT Plan  PT Frequency (ACUTE ONLY) Min 4X/week  PT Treatment/Interventions (ACUTE ONLY) Gait training;Stair training;DME instruction;Functional mobility training;Therapeutic activities;Balance training;Neuromuscular re-education;Patient/family education  AM-PAC PT "6 Clicks" Daily Activity Outcome Measure  Difficulty turning over in bed (including adjusting bedclothes, sheets and blankets)? 3  Difficulty moving from lying on back to sitting on the side of the bed?  3  Difficulty sitting down on and standing up from a chair with arms (e.g., wheelchair, bedside commode, etc,.)? 3  Help needed moving to and from a bed to chair (including a wheelchair)? 3  Help needed walking in hospital room? 3  Help needed climbing 3-5 steps with a railing?  3  6 Click Score 18  Mobility G Code  CK  PT Recommendation  Recommendations for Other Services Rehab consult  Follow Up Recommendations CIR  PT equipment (TBA)  Individuals Consulted  Consulted and Agree with Results and Recommendations Patient  Acute Rehab PT Goals  Patient Stated Goal back to work, Independent  PT Goal Formulation With patient  Time For Goal Achievement 02/21/17  Potential to Achieve Goals Good  PT Time Calculation  PT Start Time (ACUTE ONLY) 1231  PT Stop  Time (ACUTE ONLY) 1300  PT Time Calculation (min) (ACUTE ONLY) 29 min  PT G-Codes **NOT FOR INPATIENT CLASS**  Functional Assessment Tool Used AM-PAC 6 Clicks Basic Mobility;Clinical judgement  Functional Limitation Mobility: Walking and moving around  Mobility: Walking and Moving Around Current Status (W5462) CJ  Mobility: Walking and Moving Around Goal Status (V0350) CI  PT General Charges  $$ ACUTE PT VISIT 1 Visit  PT Evaluation  $PT Eval Moderate Complexity 1 Mod  PT Treatments  $Gait Training 8-22 mins

## 2017-02-14 NOTE — Progress Notes (Signed)
OT Cancellation Note  Patient Details Name: [name] MRN: [id number] DOB: 24-Oct-1950   Cancelled Treatment:    Reason Eval/Treat Not Completed: Patient at procedure or test/ unavailable. OT will check back later as schedule allows for eval.  [facility] 02/14/2017, 8:34 AM

## 2017-02-14 NOTE — Evaluation (Signed)
Speech Language Pathology Evaluation Patient Details Name: [name] MRN: [id number] DOB: 1950/12/19 Today's Date: 02/14/2017 Time: 4132-4401 SLP Time Calculation (min) (ACUTE ONLY): 18 min  Problem List:  Patient Active Problem List   Diagnosis Date Noted  . Right sided weakness 02/13/2017  . Bradycardia 02/13/2017  . Chest pain 08/19/2016  . Chest pain at rest 06/01/2013  . Rhinosinusitis 02/24/2013  . Abrasion of cornea, right 07/18/2012  . Cough 07/01/2012  . Acute coronary syndrome ([place]) 09/28/2011  . MI (myocardial infarction) ([place])   . HIP PAIN, RIGHT 06/09/2010  . TOBACCO USER 02/20/2009  . Hyperlipidemia with target low density lipoprotein (LDL) cholesterol less than 70 mg/dL 01/12/2009  . SUBSTANCE ABUSE, MULTIPLE 01/12/2009  . HYPERTENSION 01/12/2009  . SUPRAVENTRICULAR TACHYCARDIA 01/12/2009  . GERD 01/12/2009  . Osteoarthritis 01/12/2009  . TOBACCO ABUSE, HX OF 01/12/2009  . HYPERTENSION, BENIGN ESSENTIAL 07/21/2008  . Coronary atherosclerosis 07/12/2006  . GASTRIC ULCER ACUTE WITH HEMORRHAGE 07/12/2006   Past Medical History:  Past Medical History:  Diagnosis Date  . Angina   . Arthritis   . Chronic lower back pain   . Coronary artery disease   . DJD (degenerative joint disease)   . High cholesterol   . Hypertension   . Inferior MI ([place]) 08/2006  . MI (myocardial infarction) ([place]) 2011  . Peptic ulcer disease   . Pneumonia    "when I was younger"  . Sinus bradycardia 09/28/11  . SVT (supraventricular tachycardia) ([place])   . Walking pneumonia ~ 2009   Past Surgical History:  Past Surgical History:  Procedure Laterality Date  . CARDIAC CATHETERIZATION    . COLECTOMY  1990's   "ulcer burst; took out part of my colon"  . CORONARY ANGIOPLASTY WITH STENT PLACEMENT  08/2006   "1" cypher drug eluting stent ok for up to 3T   . LEFT HEART CATHETERIZATION WITH CORONARY ANGIOGRAM N/A 09/29/2011   Procedure: LEFT HEART CATHETERIZATION WITH CORONARY ANGIOGRAM;   Surgeon: [name], MD;  Location: [place] CATH LAB;  Service: Cardiovascular;  Laterality: N/A;   HPI:  [name] a 66 y.o.male presenting with sudden onset of right-sided weakness and tingling, with numbness from the right shoulder.  MRI shows tiny pontine acute versus subacute infarct and mild chronic small vessel ischemic disease.     Assessment / Plan / Recommendation Clinical Impression   Pt presents with grossly intact cognitive-linguistic function.  Pt's oral motor exam is remarkable for decreased right facial sensation but otherwise intact strength and range of motion.  Pt's speech was clear, intelligible and free from dysarthria or word finding impairment.  Pt demonstrated baseline functioning for cognition in all areas assessed.  As a result, no further ST needs are indicated at this time.      SLP Assessment  SLP Recommendation/Assessment: Patient does not need any further Speech Lanaguage Pathology Services    Follow Up Recommendations  None          SLP Evaluation Cognition  Overall Cognitive Status: Within Functional Limits for tasks assessed Orientation Level: Oriented X4       Comprehension  Auditory Comprehension Overall Auditory Comprehension: Appears within functional limits for tasks assessed    Expression Expression Primary Mode of Expression: Verbal Verbal Expression Overall Verbal Expression: Appears within functional limits for tasks assessed   Oral / Motor  Oral Motor/Sensory Function Overall Oral Motor/Sensory Function: Mild impairment Facial ROM: Within Functional Limits Facial Symmetry: Within Functional Limits Facial Strength: Within Functional Limits  Facial Sensation: Reduced right Lingual ROM: Within Functional Limits Lingual Symmetry: Within Functional Limits Lingual Strength: Within Functional Limits Lingual Sensation: Within Functional Limits Motor Speech Overall Motor Speech: Appears within functional limits for tasks assessed    GO          Functional Assessment Tool Used: cognitive-linguistic evaluation completed Functional Limitations: Memory Memory Current Status (T9030): 0 percent impaired, limited or restricted Memory Goal Status (S9233): 0 percent impaired, limited or restricted Memory Discharge Status (A0762): 0 percent impaired, limited or restricted         [name] 02/14/2017, 12:05 PM

## 2017-02-14 NOTE — Progress Notes (Signed)
Physical medicine rehabilitation consult requested chart reviewed. Patient just admitted yesterday 02/13/2017 at 1:30 PM. Current workup is ongoing and await physical and occupational therapy evaluations. Echocardiogram currently pending. Will follow up with appropriate recommendations as further evaluations are completed

## 2017-02-14 NOTE — Progress Notes (Addendum)
PROGRESS NOTE [facility]   [name]   WFU:[id number] DOB: 07-26-1950  DOA: 02/13/2017 PCP: Charolette Forward, MD   Brief Narrative:  [name] is 66 y/o male with PMHx of CAD, HTN, bradycardia and tobacco abuse. Patient presented to the ED c/o R leg numbness and tingling sensation. Patient was admitted for working of CVA - MRI reveal acute pontine infarct. Neurology was consulted.   Subjective: Patient seen and examined, today c/o back pain for the past 2 days. Still weak of his right lower extremity. No acute events overnight. Afebrile   Assessment & Plan: CVA  Not TPA candidate  MRI show pontine acute/subacute infarct  ECHO pending  CTA H/N - no signs of large vessel occlusion, atheromatous changes  Discussed case with PT - recommending CIR - consult placed  OT pending  Switch ASA to plavix Lipitor  Follow up stroke team recommendations  A1C 5.8  Bradycardia - Chronic  Follow with Dr Doylene Canard  Asymptomatic  Continue to monitor   HTN - not on meds  Allow permissive HTN  BP will normalize in the next few days  Monitor   Back pain - msk - muscle spasm  Lumbar xray  Robaxin 750 mg TID   Tobacco abuse  Nicotine patch  Counseled cessation discussed   DVT prophylaxis: Heparin sq  Code Status: FULL  Family Communication: Discussed with wife via phone call  Disposition Plan: CIR vs home    Consultants:   Stroke team   Procedures:   ECHO   Antimicrobials: Anti-infectives    None       Objective: Vitals:   02/14/17 0248 02/14/17 0544 02/14/17 0825 02/14/17 1217  BP: 127/67 134/68 133/65 (!) 163/72  Pulse: (!) 47 (!) 44 (!) 44 (!) 49  Resp: 18 17 17 18   Temp: 97.8 F (36.6 C) 97.8 F (36.6 C) 98.3 F (36.8 C) 98.4 F (36.9 C)  TempSrc: Oral Oral Oral Oral  SpO2: 97% 98% 98% 99%  Weight:      Height:        Intake/Output Summary (Last 24 hours) at 02/14/17 1508 Last data filed at 02/14/17 1415  Gross per 24 hour  Intake               462 ml  Output              775 ml  Net             -313 ml   Filed Weights   02/13/17 0940  Weight: 65.8 kg (145 lb)    Examination:  General exam: Appears calm and comfortable  HEENT: OP moist and clear Respiratory system: Clear to auscultation. No wheezes,crackle or rhonchi Cardiovascular system: S1 & S2 heard, RRR. No JVD, murmurs, rubs or gallops Gastrointestinal system: Abdomen is nondistended, soft and nontender. Normal bowel sounds heard. Central nervous system: R LE strength 4/5, mild decrease in sensation, mild nasolabial fold flattening  Extremities: No pedal edema. Skin: No rashes, lesions or ulcers. Psychiatry: Mood & affect appropriate.   Data Reviewed: I have personally reviewed following labs and imaging studies  CBC:  Recent Labs Lab 02/13/17 0951 02/13/17 1140 02/13/17 1156  WBC 6.8  --   --   NEUTROABS  --  2.9  --   HGB 12.6*  --  14.6  HCT 38.7*  --  43.0  MCV 72.1*  --   --   PLT 254  --   --    Basic  Metabolic Panel:  Recent Labs Lab 02/13/17 0951 02/13/17 1156  NA 137 140  K 3.9 3.9  CL 105 104  CO2 24  --   GLUCOSE 83 81  BUN 8 11  CREATININE 1.00 0.90  CALCIUM 9.0  --    GFR: Estimated Creatinine Clearance: 77.2 mL/min (by C-G formula based on SCr of 0.9 mg/dL). Liver Function Tests: No results for input(s): AST, ALT, ALKPHOS, BILITOT, PROT, ALBUMIN in the last 168 hours. No results for input(s): LIPASE, AMYLASE in the last 168 hours. No results for input(s): AMMONIA in the last 168 hours. Coagulation Profile:  Recent Labs Lab 02/13/17 1140  INR 1.08   Cardiac Enzymes:  Recent Labs Lab 02/13/17 1140  TROPONINI <0.03   BNP (last 3 results) No results for input(s): PROBNP in the last 8760 hours. HbA1C:  Recent Labs  02/14/17 0536  HGBA1C 5.8*   CBG: No results for input(s): GLUCAP in the last 168 hours. Lipid Profile:  Recent Labs  02/14/17 0536  CHOL 153  HDL 64  LDLCALC 78  TRIG 56  CHOLHDL 2.4    Thyroid Function Tests: No results for input(s): TSH, T4TOTAL, FREET4, T3FREE, THYROIDAB in the last 72 hours. Anemia Panel:  Recent Labs  02/13/17 1628  VITAMINB12 260  FOLATE 14.1  FERRITIN 71  TIBC 256  IRON 115  RETICCTPCT 0.8   Sepsis Labs: No results for input(s): PROCALCITON, LATICACIDVEN in the last 168 hours.  No results found for this or any previous visit (from the past 240 hour(s)).    Radiology Studies: Ct Angio Head W Or Wo Contrast  Result Date: 02/13/2017 CLINICAL DATA:  Subjective RIGHT-sided numbness and weakness. History of cocaine abuse. History of marijuana use. EXAM: CT ANGIOGRAPHY HEAD AND NECK TECHNIQUE: Multidetector CT imaging of the head and neck was performed using the standard protocol during bolus administration of intravenous contrast. Multiplanar CT image reconstructions and MIPs were obtained to evaluate the vascular anatomy. Carotid stenosis measurements (when applicable) are obtained utilizing NASCET criteria, using the distal internal carotid diameter as the denominator. CONTRAST:  Overall volume 100 mL Isovue 370, and given at 2 increments. COMPARISON:  CT head earlier in the day. FINDINGS: CTA NECK Aortic arch: Standard branching. Imaged portion shows no evidence of aneurysm or dissection. No significant stenosis of the major arch vessel origins. Right carotid system: Minor atheromatous change at the bifurcation. No evidence of dissection, stenosis (50% or greater) or occlusion. Left carotid system: No significant atheromatous change at the bifurcation. No evidence of dissection, stenosis (50% or greater) or occlusion. Vertebral arteries: Codominant. No evidence of dissection, stenosis (50% or greater) or occlusion. Nonvascular soft tissues: Spondylosis. Poor dentition. No lung apex lesion. Emphysema. No neck masses. CTA HEAD Anterior circulation: Calcification of the cavernous internal carotid arteries consistent with cerebrovascular atherosclerotic  disease. No significant ICA/ACA/MCA stenosis, proximal occlusion, aneurysm, or vascular malformation. Posterior circulation: Codominant vertebral arteries supply the basilar, all widely patent. No significant stenosis, proximal occlusion, aneurysm, or vascular malformation. Venous sinuses: As permitted by contrast timing, patent. Anatomic variants: None of significance. Delayed phase:   No abnormal intracranial enhancement. IMPRESSION: Minor atheromatous change, but no flow reducing intracranial or extracranial stenosis. Specifically, no signs of large vessel occlusion. These results were called by telephone at the time of interpretation on 02/13/2017 at 12;17 pm to Dr. Amie Portland , who verbally acknowledged these results. Electronically Signed   By: Staci Righter M.D.   On: 02/13/2017 13:15   Ct Angio Neck W  Or Wo Contrast  Result Date: 02/13/2017 CLINICAL DATA:  Subjective RIGHT-sided numbness and weakness. History of cocaine abuse. History of marijuana use. EXAM: CT ANGIOGRAPHY HEAD AND NECK TECHNIQUE: Multidetector CT imaging of the head and neck was performed using the standard protocol during bolus administration of intravenous contrast. Multiplanar CT image reconstructions and MIPs were obtained to evaluate the vascular anatomy. Carotid stenosis measurements (when applicable) are obtained utilizing NASCET criteria, using the distal internal carotid diameter as the denominator. CONTRAST:  Overall volume 100 mL Isovue 370, and given at 2 increments. COMPARISON:  CT head earlier in the day. FINDINGS: CTA NECK Aortic arch: Standard branching. Imaged portion shows no evidence of aneurysm or dissection. No significant stenosis of the major arch vessel origins. Right carotid system: Minor atheromatous change at the bifurcation. No evidence of dissection, stenosis (50% or greater) or occlusion. Left carotid system: No significant atheromatous change at the bifurcation. No evidence of dissection, stenosis (50% or  greater) or occlusion. Vertebral arteries: Codominant. No evidence of dissection, stenosis (50% or greater) or occlusion. Nonvascular soft tissues: Spondylosis. Poor dentition. No lung apex lesion. Emphysema. No neck masses. CTA HEAD Anterior circulation: Calcification of the cavernous internal carotid arteries consistent with cerebrovascular atherosclerotic disease. No significant ICA/ACA/MCA stenosis, proximal occlusion, aneurysm, or vascular malformation. Posterior circulation: Codominant vertebral arteries supply the basilar, all widely patent. No significant stenosis, proximal occlusion, aneurysm, or vascular malformation. Venous sinuses: As permitted by contrast timing, patent. Anatomic variants: None of significance. Delayed phase:   No abnormal intracranial enhancement. IMPRESSION: Minor atheromatous change, but no flow reducing intracranial or extracranial stenosis. Specifically, no signs of large vessel occlusion. These results were called by telephone at the time of interpretation on 02/13/2017 at 12;17 pm to Dr. Amie Portland , who verbally acknowledged these results. Electronically Signed   By: Staci Righter M.D.   On: 02/13/2017 13:15   Mr Brain Wo Contrast  Result Date: 02/14/2017 CLINICAL DATA:  Acute onset RIGHT-sided weakness tingling and numbness this morning ; symptoms improving. Persistent RIGHT leg heaviness. History of hypertension, smoker. EXAM: MRI HEAD WITHOUT CONTRAST TECHNIQUE: Multiplanar, multiecho pulse sequences of the brain and surrounding structures were obtained without intravenous contrast. COMPARISON:  CT HEAD February 13, 2017 FINDINGS: BRAIN: 3 mm focus LEFT ventral pons without corresponding ADC abnormality, possibly due to small size with faint FLAIR T2 hyperintensity. No susceptibility artifact to suggest hemorrhage. The ventricles and sulci are normal for patient's age. Scattered supratentorial subcentimeter white matter T2 hyperintensities, with minimal cystic component.  No suspicious parenchymal signal, mass or mass effect. No abnormal extra-axial fluid collections. VASCULAR: Normal major intracranial vascular flow voids present at skull base. SKULL AND UPPER CERVICAL SPINE: No abnormal sellar expansion. No suspicious calvarial bone marrow signal. Craniocervical junction maintained. SINUSES/ORBITS: Trace paranasal sinus mucosal thickening. Small RIGHT maxillary mucosal retention cyst. Mastoid air cells are well aerated. AP all Set 8. The included ocular globes and orbital contents are non-suspicious. OTHER: None. IMPRESSION: 1. Tiny pontine acute versus subacute infarct. 2. Mild chronic small vessel ischemic disease. Electronically Signed   By: Elon Alas M.D.   On: 02/14/2017 05:04   Ct Head Code Stroke Wo Contrast  Result Date: 02/13/2017 CLINICAL DATA:  Code stroke. RIGHT-sided weakness and numbness, last seen normal at 0815 hours. EXAM: CT HEAD WITHOUT CONTRAST TECHNIQUE: Contiguous axial images were obtained from the base of the skull through the vertex without intravenous contrast. COMPARISON:  MR brain 09/11/2015. FINDINGS: Brain: No evidence for acute infarction, hemorrhage, mass lesion,  hydrocephalus, or extra-axial fluid. Normal cerebral volume. No white matter disease. Vascular: No hyperdense vessel or unexpected calcification. Skull: Normal. Negative for fracture or focal lesion. Sinuses/Orbits: No acute finding. Other: None. ASPECTS [facility] Stroke Program Early CT Score) - Ganglionic level infarction (caudate, lentiform nuclei, internal capsule, insula, M1-M3 cortex): 7 - Supraganglionic infarction (M4-M6 cortex): 3 Total score (0-10 with 10 being normal): 10 IMPRESSION: 1. No acute intracranial findings. No features suggestive of large vessel occlusion. 2. ASPECTS is 10 These results were called by telephone at the time of interpretation on 02/13/2017 at 12:15 pm to Dr. Rory Percy , who verbally acknowledged these results. Electronically Signed   By: Staci Righter M.D.   On: 02/13/2017 12:19      Scheduled Meds: .  stroke: mapping our early stages of recovery book   Does not apply Once  . clopidogrel  75 mg Oral Daily  . heparin  5,000 Units Subcutaneous Q8H  . methocarbamol  750 mg Oral TID  . [START ON 02/15/2017] pneumococcal 23 valent vaccine  0.5 mL Intramuscular Tomorrow-1000   Continuous Infusions:   LOS: 0 days    Time spent: Total of 25 minutes spent with pt, greater than 50% of which was spent in discussion of  treatment, counseling and coordination of care    Chipper Oman, MD Pager: Text Page via [email or web address]   If 7PM-7AM, please contact night-coverage [email or web address] 02/14/2017, 3:08 PM

## 2017-02-15 ENCOUNTER — Other Ambulatory Visit (HOSPITAL_COMMUNITY): Payer: Self-pay

## 2017-02-15 DIAGNOSIS — G8191 Hemiplegia, unspecified affecting right dominant side: Secondary | ICD-10-CM

## 2017-02-15 DIAGNOSIS — M549 Dorsalgia, unspecified: Secondary | ICD-10-CM

## 2017-02-15 DIAGNOSIS — I6350 Cerebral infarction due to unspecified occlusion or stenosis of unspecified cerebral artery: Principal | ICD-10-CM

## 2017-02-15 MED ORDER — TRAMADOL HCL 50 MG PO TABS
50.0000 mg | ORAL_TABLET | Freq: Four times a day (QID) | ORAL | Status: DC | PRN
  Administered 2017-02-15: 50 mg via ORAL
  Filled 2017-02-15: qty 1

## 2017-02-15 MED ORDER — POLYETHYLENE GLYCOL 3350 17 G PO PACK
17.0000 g | PACK | Freq: Every day | ORAL | Status: DC | PRN
  Administered 2017-02-15: 17 g via ORAL
  Filled 2017-02-15: qty 1

## 2017-02-15 NOTE — Progress Notes (Signed)
Physical Therapy Treatment Patient Details Name: [name] MRN: [id number] DOB: November 28, 1950 Today's Date: 02/15/2017    History of Present Illness Pt is a 66 y.o. male with medical history significant for chronic low back pain, CAD status post MI in 2011, history of bradycardia, hypertension, hyperlipidemia, tobacco abuse, recreational marijuana use, presenting with sudden onset of right-sided weakness and numbness/tingling. Since presentation, symptoms have in improved, without resolution. He reports the heaviness in the right leg remaining. 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    PT Comments    Improving steadily.  Emphasis on quality of mobility, gait stability and safe use of the cane to increase stability.   Follow Up Recommendations  Home health PT (pt favoring home vs CIR for financial reasons)     Equipment Recommendations  None recommended by PT;Other (comment) (pt to search for a cane)    Recommendations for Other Services       Precautions / Restrictions Precautions Precautions: Fall Restrictions Weight Bearing Restrictions: No    Mobility  Bed Mobility   Bed Mobility: Rolling;Sidelying to Sit;Sit to Supine Rolling: Modified independent (Device/Increase time) Sidelying to sit: Modified independent (Device/Increase time)   Sit to supine: Modified independent (Device/Increase time)   General bed mobility comments: pt showed ability to get to EOB in a coordinated manner.  Transfers Overall transfer level: Needs assistance Equipment used: Straight cane Transfers: Sit to/from Stand Sit to Stand: Supervision         General transfer comment: Cues for safety and hand placement, safest use of the cane when in use.    Ambulation/Gait Ambulation/Gait assistance: Min guard;Supervision Ambulation Distance (Feet): 250 Feet Assistive device: Straight cane Gait Pattern/deviations: Step-through pattern   Gait velocity interpretation: Below  normal speed for age/gender General Gait Details: improving hemiparetic gait with practice using cane in L UE for stability.  Working further on improving quality, heel toe, subtleness of w/shift, sequencing with the cane   Stairs            Wheelchair Mobility    Modified Rankin (Stroke Patients Only) Modified Rankin (Stroke Patients Only) Pre-Morbid Rankin Score: No symptoms Modified Rankin: Moderate disability     Balance Overall balance assessment: Needs assistance   Sitting balance-Leahy Scale: Good       Standing balance-Leahy Scale: Fair                              Cognition Arousal/Alertness: Awake/alert Behavior During Therapy: WFL for tasks assessed/performed Overall Cognitive Status: Within Functional Limits for tasks assessed                                        Exercises      General Comments        Pertinent Vitals/Pain Pain Assessment: No/denies pain    Home Living                      Prior Function            PT Goals (current goals can now be found in the care plan section) Acute Rehab PT Goals Patient Stated Goal: back to work, Independent PT Goal Formulation: With patient Time For Goal Achievement: 02/21/17 Potential to Achieve Goals: Good Progress towards PT goals: Progressing toward goals    Frequency    Min 3X/week  PT Plan Discharge plan needs to be updated    Co-evaluation              AM-PAC PT "6 Clicks" Daily Activity  Outcome Measure  Difficulty turning over in bed (including adjusting bedclothes, sheets and blankets)?: None Difficulty moving from lying on back to sitting on the side of the bed? : A Little Difficulty sitting down on and standing up from a chair with arms (e.g., wheelchair, bedside commode, etc,.)?: A Little Help needed moving to and from a bed to chair (including a wheelchair)?: A Little Help needed walking in hospital room?: A Little Help  needed climbing 3-5 steps with a railing? : A Little 6 Click Score: 19    End of Session   Activity Tolerance: Patient tolerated treatment well Patient left: in bed;with call bell/phone within reach;with bed alarm set Nurse Communication: Mobility status PT Visit Diagnosis: Unsteadiness on feet (R26.81);Hemiplegia and hemiparesis Hemiplegia - Right/Left: Right Hemiplegia - dominant/non-dominant: Dominant Hemiplegia - caused by: Cerebral infarction     Time: 1710-1739 PT Time Calculation (min) (ACUTE ONLY): 29 min  Charges:  $Gait Training: 8-22 mins $Therapeutic Activity: 8-22 mins                    G Codes:       02-21-17  Donnella Sham, PT [phone]  (pager)   Tessie Fass Wania [place]streth Feb 21, 2017, 5:56 PM

## 2017-02-15 NOTE — Progress Notes (Signed)
PROGRESS NOTE [facility]   [name]   IOX:[id number] DOB: [birth year]  DOA: 02/13/2017 PCP: Charolette Forward, MD   Brief Narrative:  [name] is 66 y/o male with PMHx of CAD, HTN, bradycardia and tobacco abuse. Patient presented to the ED c/o R leg numbness and tingling sensation. Patient was admitted for working of CVA - MRI reveal acute pontine infarct. Neurology was consulted.   Subjective: Back pain some improvement. No acute events overnight.   Assessment & Plan: CVA  RLE weakness and ataxia  Not TPA candidate  MRI show pontine acute/subacute infarct - small vessels diseases  ECHO pending if normal can be d/c home  CTA H/N - no signs of large vessel occlusion, minor atheromatous changes  Discussed case with PT - recommending CIR - patient can't afford copayment request to be d/c home with HHPT  Switched ASA to plavix Continue Lipitor  Follow up with stroke clinic in 6 weeks.  A1C 5.8 Smoking cessation discussed   Bradycardia - Chronic  Follow with Dr Doylene Canard  Asymptomatic  Continue to monitor   HTN - not on meds  Allow permissive HTN  BP will normalize in the next few days - continue to monitor  Monitor   Back pain - msk - muscle spasm  Lumbar xray - chronic degenerative changes  Robaxin 750 mg TID  Ultram PRN   Tobacco abuse  Nicotine patch  Counseled cessation discussed   DVT prophylaxis: Heparin sq  Code Status: FULL  Family Communication: Discussed with wife via phone call  Disposition Plan: CIR vs home    Consultants:   Stroke team   Procedures:   ECHO   Antimicrobials: Anti-infectives    None      Objective: Vitals:   02/15/17 0025 02/15/17 0422 02/15/17 0800 02/15/17 1200  BP: (!) 146/69 (!) 141/79 (!) 155/79 (!) 155/71  Pulse: (!) 53 (!) 51 (!) 53 (!) 46  Resp: 18 18 20 18   Temp: 98.4 F (36.9 C) 98 F (36.7 C) 98.1 F (36.7 C) 98.4 F (36.9 C)  TempSrc: Oral Oral Oral Oral  SpO2: 99% 98%    Weight:        Height:        Intake/Output Summary (Last 24 hours) at 02/15/17 1652 Last data filed at 02/15/17 0544  Gross per 24 hour  Intake              240 ml  Output              500 ml  Net             -260 ml   Filed Weights   02/13/17 0940  Weight: 65.8 kg (145 lb)    Examination:  General: Pt is alert, awake, not in acute distress Cardiovascular: RRR, S1/S2 +, no rubs, no gallops Respiratory: CTA bilaterally, no wheezing, no rhonchi Abdominal: Soft, NT, ND, bowel sounds + Extremities: no edema, strength 4/5 RLE, right grip weakness  Neuro: Gait ataxic, sensation decrease on the right. Tongue midline MSK: Paraspinal tenderness over lumbar area   Data Reviewed: I have personally reviewed following labs and imaging studies  CBC:  Recent Labs Lab 02/13/17 0951 02/13/17 1140 02/13/17 1156  WBC 6.8  --   --   NEUTROABS  --  2.9  --   HGB 12.6*  --  14.6  HCT 38.7*  --  43.0  MCV 72.1*  --   --   PLT 254  --   --  Basic Metabolic Panel:  Recent Labs Lab 02/13/17 0951 02/13/17 1156  NA 137 140  K 3.9 3.9  CL 105 104  CO2 24  --   GLUCOSE 83 81  BUN 8 11  CREATININE 1.00 0.90  CALCIUM 9.0  --    GFR: Estimated Creatinine Clearance: 77.2 mL/min (by C-G formula based on SCr of 0.9 mg/dL). Liver Function Tests: No results for input(s): AST, ALT, ALKPHOS, BILITOT, PROT, ALBUMIN in the last 168 hours. No results for input(s): LIPASE, AMYLASE in the last 168 hours. No results for input(s): AMMONIA in the last 168 hours. Coagulation Profile:  Recent Labs Lab 02/13/17 1140  INR 1.08   Cardiac Enzymes:  Recent Labs Lab 02/13/17 1140  TROPONINI <0.03   BNP (last 3 results) No results for input(s): PROBNP in the last 8760 hours. HbA1C:  Recent Labs  02/14/17 0536  HGBA1C 5.8*   CBG: No results for input(s): GLUCAP in the last 168 hours. Lipid Profile:  Recent Labs  02/14/17 0536  CHOL 153  HDL 64  LDLCALC 78  TRIG 56  CHOLHDL 2.4   Thyroid  Function Tests: No results for input(s): TSH, T4TOTAL, FREET4, T3FREE, THYROIDAB in the last 72 hours. Anemia Panel:  Recent Labs  02/13/17 1628  VITAMINB12 260  FOLATE 14.1  FERRITIN 71  TIBC 256  IRON 115  RETICCTPCT 0.8   Sepsis Labs: No results for input(s): PROCALCITON, LATICACIDVEN in the last 168 hours.  No results found for this or any previous visit (from the past 240 hour(s)).    Radiology Studies: Dg Lumbar Spine 2-3 Views  Result Date: 02/14/2017 CLINICAL DATA:  Right-sided low back pain for 3-4 days. No known injury. EXAM: LUMBAR SPINE - 2-3 VIEW COMPARISON:  Lumbar spine MRI dated September 11, 2015. FINDINGS: Transitional lumbosacral anatomy again noted. No acute fracture or subluxation. Vertebral body heights are preserved. Stable mild disc height loss at L4-L5 and L5-S1. Mild lower lumbar facet arthropathy. Atherosclerotic vascular calcifications. IMPRESSION: 1. Mild degenerative changes of the lower lumbar spine, similar to prior study. No acute osseous abnormality. Electronically Signed   By: Titus Dubin M.D.   On: 02/14/2017 16:49   Mr Brain Wo Contrast  Result Date: 02/14/2017 CLINICAL DATA:  Acute onset RIGHT-sided weakness tingling and numbness this morning ; symptoms improving. Persistent RIGHT leg heaviness. History of hypertension, smoker. EXAM: MRI HEAD WITHOUT CONTRAST TECHNIQUE: Multiplanar, multiecho pulse sequences of the brain and surrounding structures were obtained without intravenous contrast. COMPARISON:  CT HEAD February 13, 2017 FINDINGS: BRAIN: 3 mm focus LEFT ventral pons without corresponding ADC abnormality, possibly due to small size with faint FLAIR T2 hyperintensity. No susceptibility artifact to suggest hemorrhage. The ventricles and sulci are normal for patient's age. Scattered supratentorial subcentimeter white matter T2 hyperintensities, with minimal cystic component. No suspicious parenchymal signal, mass or mass effect. No abnormal  extra-axial fluid collections. VASCULAR: Normal major intracranial vascular flow voids present at skull base. SKULL AND UPPER CERVICAL SPINE: No abnormal sellar expansion. No suspicious calvarial bone marrow signal. Craniocervical junction maintained. SINUSES/ORBITS: Trace paranasal sinus mucosal thickening. Small RIGHT maxillary mucosal retention cyst. Mastoid air cells are well aerated. AP all Set 8. The included ocular globes and orbital contents are non-suspicious. OTHER: None. IMPRESSION: 1. Tiny pontine acute versus subacute infarct. 2. Mild chronic small vessel ischemic disease. Electronically Signed   By: Elon Alas M.D.   On: 02/14/2017 05:04     Scheduled Meds: . clopidogrel  75 mg Oral Daily  .  heparin  5,000 Units Subcutaneous Q8H  . methocarbamol  750 mg Oral TID  . nicotine  21 mg Transdermal Daily   Continuous Infusions:   LOS: 1 day   Time spent: Total of 25 minutes spent with pt, greater than 50% of which was spent in discussion of  treatment, counseling and coordination of care    Chipper Oman, MD Pager: Text Page via [email or web address]   If 7PM-7AM, please contact night-coverage [email or web address] 02/15/2017, 4:52 PM

## 2017-02-15 NOTE — Progress Notes (Signed)
STROKE TEAM PROGRESS NOTE   HISTORY OF PRESENT ILLNESS (per record) [name] is an 66 y.o. male presenting for evaluation of sudden onset weakness to R side and back pain.  Pt reports was at work when he suddenly felt numbness and weakness to R arm and leg.  Pt reports weakness has improved but numbness still present.  Currently he has subjective sensory findings and mild right leg drift.   History of cocaine abuse but quit 14 years ago and last THC was 1 month and still smokes. Vehemently denies any current cocaine use.  Date last known well: Date: 02/13/2017 Time last known well: Time: 08:30 tPA Given: No: minimal symptoms NIHSS 2                                                                                               Modified Rankin: Rankin Score=0   SUBJECTIVE (INTERVAL HISTORY) His family is at the bedside is at the bedside. Patient feels is getting better but Takes a cannot afford inpatient rehabilitation and would prefer to go home and get home therapy. .   OBJECTIVE Temp:  [98 F (36.7 C)-98.6 F (37 C)] 98.4 F (36.9 C) (10/04 1200) Pulse Rate:  [46-57] 46 (10/04 1200) Cardiac Rhythm: Sinus bradycardia (10/04 0700) Resp:  [16-20] 18 (10/04 1200) BP: (130-155)/(61-80) 155/71 (10/04 1200) SpO2:  [98 %-99 %] 98 % (10/04 0422)  CBC:   Recent Labs Lab 02/13/17 0951 02/13/17 1140 02/13/17 1156  WBC 6.8  --   --   NEUTROABS  --  2.9  --   HGB 12.6*  --  14.6  HCT 38.7*  --  43.0  MCV 72.1*  --   --   PLT 254  --   --     Basic Metabolic Panel:   Recent Labs Lab 02/13/17 0951 02/13/17 1156  NA 137 140  K 3.9 3.9  CL 105 104  CO2 24  --   GLUCOSE 83 81  BUN 8 11  CREATININE 1.00 0.90  CALCIUM 9.0  --     Lipid Panel:     Component Value Date/Time   CHOL 153 02/14/2017 0536   TRIG 56 02/14/2017 0536   HDL 64 02/14/2017 0536   CHOLHDL 2.4 02/14/2017 0536   VLDL 11 02/14/2017 0536   LDLCALC 78 02/14/2017 0536   HgbA1c:  Lab Results   Component Value Date   HGBA1C 5.8 (H) 02/14/2017   Urine Drug Screen:     Component Value Date/Time   LABOPIA NONE DETECTED 02/13/2017 1249   COCAINSCRNUR NONE DETECTED 02/13/2017 1249   LABBENZ NONE DETECTED 02/13/2017 1249   AMPHETMU NONE DETECTED 02/13/2017 1249   THCU NONE DETECTED 02/13/2017 1249   LABBARB NONE DETECTED 02/13/2017 1249    Alcohol Level     Component Value Date/Time   ETH <10 02/13/2017 1140    IMAGING Ct Angio Head W Or Wo Contrast Ct Angio Neck W Or Wo Contrast 02/13/2017 IMPRESSION: Minor atheromatous change, but no flow reducing intracranial or extracranial stenosis. Specifically, no signs of large vessel occlusion.  Dg Lumbar Spine 2-3  Views 02/14/2017 IMPRESSION: 1. Mild degenerative changes of the lower lumbar spine, similar to prior study. No acute osseous abnormality. Electronically Signed   By: Titus Dubin M.D.   On: 02/14/2017 16:49   Mr Brain Wo Contrast 02/14/2017 IMPRESSION: 1. Tiny pontine acute versus subacute infarct. 2. Mild chronic small vessel ischemic disease.  Ct Head Code Stroke Wo Contrast 02/13/2017 IMPRESSION: 1. No acute intracranial findings. No features suggestive of large vessel occlusion. 2. ASPECTS is 10    PHYSICAL EXAM  Pleasant middle-aged African-American male currently not in distress. . Afebrile. Head is nontraumatic. Neck is supple without bruit.    Cardiac exam no murmur or gallop. Lungs are clear to auscultation. Distal pulses are well felt. Neurological exam :  Awake alert oriented 3 with normal speech and language function. No aphasia, apraxia dysarthria. Follows commands well. Extraocular moments are full range without nystagmus. Fundi were not visualized. Vision acuity and fields seem adequate. Face is symmetric without weakness. Tongue is midline. Motor system exam revealed no upper extremity drift. Mild weakness of right lower extremity 4/5. Weakness of right grip and intrinsic hand muscles. Fine finger  movements are diminished on the right. Orbits left over right upper extremity. Mild impaired right finger-to-nose and knee to heel coordination. Sensation is diminished on the right compared to the left. Gait mildly ataxic and dragging his left side. ASSESSMENT/PLAN [name] is a 66 y.o. male with history of CAD s/p MI 2011, hypertension, hyperlipidemia, tobacco use presenting with right sided weakness and numbness from shoulder to toes. He did not receive IV t-PA due to minimal persistent symptoms.   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 Yes; Fluid consistency: Thin  aspirin 325 mg daily prior to admission, now on clopidogrel 75 mg daily  Patient counseled to be compliant with his antithrombotic medications  Ongoing aggressive stroke risk factor management  Therapy recommendations:  PT/OT pending  Disposition:  Pending  Hypertension  Unstable, 130s-160s SBP today  Permissive hypertension (OK if < 220/120) but gradually normalize in 5-7 days  Long-term BP goal normotensive  Hyperlipidemia  Home meds: Formerly atorvastatin unclear if still taking  LDL 78, goal < 70  Add atorvastatin 40mg   Continue statin at discharge  Diabetes  HgbA1c 5.8%, goal < 7.0  Controlled  Other Stroke Risk Factors  Advanced age  Cigarette smoker 3 cigarettes/day, advised to stop smoking   Coronary artery disease  Other Active Problems  -  Hospital day # 1  I have personally examined this patient, reviewed notes, independently viewed imaging studies, participated in medical  decision making and plan of care.ROS completed by me personally and pertinent positives fully documented  I have made any additions or clarifications directly to the above note. He presented with right-sided weakness numbness and ataxia due to small left pontine infarct from small vessel disease. Recommend change aspirin to Plavix and quit smoking. Check echocardiogram and aggressive risk factor modification. Discussed with patient,  and Dr. Quincy Simmonds. Patient wants to be discharged home as he cannot afford inpatient rehabilitation co-pay. Stroke team will sign off. Follow-up as an outpatient  in stroke clinic in 6 weeks.  Antony Contras, MD Medical Director [facility] Pager: [phone] 02/15/2017 4:12 PM   To contact Stroke Continuity provider, please refer to [email or web address] After hours, contact General Neurology

## 2017-02-15 NOTE — Evaluation (Signed)
Occupational Therapy Evaluation Patient Details Name: [name] MRN: [id number] DOB: 10-17-1950 Today's Date: 02/15/2017    History of Present Illness Pt is a 66 y.o. male with medical history significant for chronic low back pain, CAD status post MI in 2011, history of bradycardia, hypertension, hyperlipidemia, tobacco abuse, recreational marijuana use, presenting with sudden onset of right-sided weakness and numbness/tingling. Since presentation, symptoms have in improved, without resolution. He reports the heaviness in the right leg remaining. 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   Clinical Impression   This 66 y/o M presents with the above. At baseline Pt is independent with ADLs and functional mobility, was working. Pt currently requires MinA for functional mobility, ModA for LB ADLs; presents with R side weakness, decreased activity tolerance. Feel Pt will benefit from continued acute OT services and CIR level services after discharge to progress Pt's safety and independence with ADLs and functional mobility prior to return home.     Follow Up Recommendations  CIR;Supervision/Assistance - 24 hour    Equipment Recommendations  Other (comment) (defer to next venue )           Precautions / Restrictions Precautions Precautions: Fall Restrictions Weight Bearing Restrictions: No      Mobility Bed Mobility Overal bed mobility: Needs Assistance Bed Mobility: Supine to Sit     Supine to sit: Min guard;HOB elevated Sit to supine: Min guard   General bed mobility comments: close guard for safety   Transfers Overall transfer level: Needs assistance Equipment used: 1 person hand held assist Transfers: Sit to/from Stand Sit to Stand: Min assist         General transfer comment: slow and effortful; HHA to rise and steady    Balance Overall balance assessment: Needs assistance   Sitting balance-Leahy Scale: Good       Standing balance-Leahy  Scale: Fair Standing balance comment: static stance initially without assist; requires MinA with increased standing due to LE fatigue/buckling                           ADL either performed or assessed with clinical judgement   ADL Overall ADL's : Needs assistance/impaired Eating/Feeding: Set up;Sitting   Grooming: Wash/dry hands;Wash/dry face;Oral care;Minimal assistance;Standing   Upper Body Bathing: Minimal assistance;Sitting   Lower Body Bathing: Minimal assistance;Sit to/from stand   Upper Body Dressing : Min guard;Sitting Upper Body Dressing Details (indicate cue type and reason): doffing/donning new gown  Lower Body Dressing: Moderate assistance;Sit to/from stand Lower Body Dressing Details (indicate cue type and reason): increased effort to adjust R sock; able to adjust L sock without difficulty  Toilet Transfer: Minimal assistance;Ambulation;Regular Toilet;Grab bars   Toileting- Clothing Manipulation and Hygiene: Minimal assistance;Sit to/from stand Toileting - Clothing Manipulation Details (indicate cue type and reason): min steady assist while Pt performs clothing management      Functional mobility during ADLs: Minimal assistance General ADL Comments: Min HHA within room; Pt reaching for and steadying self on items in room during mobility; Pt with bil knee buckling (R>L) while standing at sink to complete grooming ADLs, Pt was able to recognize that this was happening however has difficulty correcting due to RLE weakness      Vision Baseline Vision/History: Wears glasses Wears Glasses: At all times Patient Visual Report: No change from baseline Vision Assessment?: Yes Eye Alignment: Within Functional Limits Ocular Range of Motion: Restricted on the right;Other (comment) (able to track to the R however  has difficulty maintaining) Tracking/Visual Pursuits: Other (comment) (difficulty holding eye position out of midline (R>L)) Additional Comments: to be  further assessed within functional context                 Pertinent Vitals/Pain Pain Assessment: Faces Faces Pain Scale: Hurts little more Pain Location: RLE leading up into R lower back  Pain Descriptors / Indicators: Heaviness Pain Intervention(s): Monitored during session;Limited activity within patient's tolerance     Hand Dominance Right   Extremity/Trunk Assessment Upper Extremity Assessment Upper Extremity Assessment: RUE deficits/detail RUE Deficits / Details: R shoulder grossly 3+/5; elbow grossly 4/5; fine motor appears intact            Communication Communication Communication: No difficulties   Cognition Arousal/Alertness: Awake/alert Behavior During Therapy: WFL for tasks assessed/performed Overall Cognitive Status: Within Functional Limits for tasks assessed                                     General Comments  Pt son/daughter-in-law present during session                Home Living Family/patient expects to be discharged to:: Private residence Living Arrangements: Non-relatives/Friends Available Help at Discharge: Family;Friend(s);Available PRN/intermittently (reports someone will be around if needed) Type of Home: House Home Access: Stairs to enter [employer] of Steps: several Entrance Stairs-Rails: Right;Left Home Layout: Two level;Able to live on main level with bedroom/bathroom Alternate Level Stairs-Number of Steps: flights Alternate Level Stairs-Rails: Right;Left Bathroom Shower/Tub: Teacher, early years/pre: Standard     Home Equipment: Grab bars - tub/shower      Lives With: Significant other    Prior Functioning/Environment Level of Independence: Independent        Comments: works, drives, runs errands, helps with household        OT Problem List: Decreased strength;Impaired balance (sitting and/or standing);Decreased range of motion;Decreased activity tolerance;Decreased knowledge of  use of DME or AE;Impaired UE functional use      OT Treatment/Interventions: Self-care/ADL training;DME and/or AE instruction;Therapeutic activities;Balance training;Therapeutic exercise;Patient/family education;Energy conservation    OT Goals(Current goals can be found in the care plan section) Acute Rehab OT Goals Patient Stated Goal: back to work, Independent OT Goal Formulation: With patient Time For Goal Achievement: 03/01/17 Potential to Achieve Goals: Good  OT Frequency: Min 2X/week                             AM-PAC PT "6 Clicks" Daily Activity     Outcome Measure Help from another person eating meals?: A Little Help from another person taking care of personal grooming?: A Little Help from another person toileting, which includes using toliet, bedpan, or urinal?: A Little Help from another person bathing (including washing, rinsing, drying)?: A Lot Help from another person to put on and taking off regular upper body clothing?: A Little Help from another person to put on and taking off regular lower body clothing?: A Lot 6 Click Score: 16   End of Session Equipment Utilized During Treatment: Gait belt Nurse Communication: Mobility status  Activity Tolerance: Patient tolerated treatment well Patient left: in bed;with call bell/phone within reach;with bed alarm set;with family/visitor present  OT Visit Diagnosis: Unsteadiness on feet (R26.81);Hemiplegia and hemiparesis Hemiplegia - Right/Left: Right Hemiplegia - dominant/non-dominant: Dominant Hemiplegia - caused by: Cerebral infarction  Time: 7939-0300 OT Time Calculation (min): 29 min Charges:  OT General Charges $OT Visit: 1 Visit OT Evaluation $OT Eval Moderate Complexity: 1 Mod OT Treatments $Self Care/Home Management : 8-22 mins G-Codes:     Lou Cal, OT Pager [phone] 02/15/2017   Raymondo Band 02/15/2017, 11:44 AM

## 2017-02-15 NOTE — Progress Notes (Signed)
Inpatient Rehabilitation  Met with patient and significant other at bedside to discuss team's recommendation for IP Rehab.  Shared booklets, insurance verification letter, and answered questions.  I shared the information for financial counseling with him and offered a [facility] payment plan.  Patient is concerned about the cost of IP Rehab given that he has a limited monthly income and is uninsured.  He is requesting to go home and inquired about the cost of home health therapies.  Notified nurse case Freight forwarder.  Will sign off at this time.  Call if questions.   Carmelia Roller., CCC/SLP Admission Coordinator  [place]  Cell [phone]

## 2017-02-15 NOTE — Consult Note (Signed)
Physical Medicine and Rehabilitation Consult Reason for Consult:Right side weakness Referring Physician: [facility]   HPI: [name] is a 66 y.o.right handed male with history of chronic back pain, CAD status post MI, hypertension, tobacco and cocaine/ marijuana use. Per chart review patient lives with spouse. Two-level home with bed and bathroom on first floor Independent prior to admission and still working. Presented 02/13/2017 with sudden onset of right sided weakness and numbness.Urine drug screen was negative. Cranial CT scan negative for acute changes. CTA of head and neck showed no signs of large vessel occlusion. MRI showed tiny pontine acute versus subacute infarct.Lumbar spine films negative for acute process. Patient did not receive TPA. Echocardiogram is pending. Currently on Plavix for CVA prophylaxis. Subcutaneous heparin for DVT prophylaxis. Tolerating a regular diet. Physical therapy evaluation completed with recommendations of physical medicine rehabilitation consult. Physical therapy evaluation completed 02/14/2017 with recommendations of physical medicine rehabilitation consult.   Review of Systems  Constitutional: Negative for chills and fever.  HENT: Negative for hearing loss.   Eyes: Negative for blurred vision and double vision.  Respiratory: Negative for shortness of breath.   Cardiovascular: Positive for palpitations. Negative for chest pain and leg swelling.  Gastrointestinal: Positive for constipation. Negative for nausea.       GERD  Genitourinary: Negative for dysuria, flank pain and hematuria.  Musculoskeletal: Positive for back pain and myalgias.  Skin: Negative for rash.  Neurological: Positive for tingling and weakness. Negative for seizures.  All other systems reviewed and are negative.  Past Medical History:  Diagnosis Date  . Angina   . Arthritis   . Chronic lower back pain   . Coronary artery disease   . DJD (degenerative joint disease)   .  GERD (gastroesophageal reflux disease)   . High cholesterol   . Hypertension   . Inferior MI ([place]) 08/2006  . MI (myocardial infarction) ([place]) 2011  . Peptic ulcer disease   . Pneumonia    "when I was younger"  . Sinus bradycardia 09/28/11  . SVT (supraventricular tachycardia) ([place])   . TIA (transient ischemic attack)   . Walking pneumonia ~ 2009   Past Surgical History:  Procedure Laterality Date  . CARDIAC CATHETERIZATION    . COLECTOMY  1990's   "ulcer burst; took out part of my colon"  . CORONARY ANGIOPLASTY WITH STENT PLACEMENT  08/2006   "1" cypher drug eluting stent ok for up to 3T   . LEFT HEART CATHETERIZATION WITH CORONARY ANGIOGRAM N/A 09/29/2011   Procedure: LEFT HEART CATHETERIZATION WITH CORONARY ANGIOGRAM;  Surgeon: [name], MD;  Location: [facility];  Service: Cardiovascular;  Laterality: N/A;   History reviewed. No pertinent family history. Social History:  reports that he has been smoking Cigarettes.  He has a 26.00 pack-year smoking history. He has never used smokeless tobacco. He reports that he drinks about 7.2 oz of alcohol per week . He reports that he uses drugs, including Marijuana and Cocaine. Allergies: No Known Allergies Medications Prior to Admission  Medication Sig Dispense Refill  . aspirin 325 MG tablet Take 325 mg by mouth daily.    . [DISCONTINUED] amLODipine (NORVASC) 5 MG tablet Take 1 tablet (5 mg total) by mouth daily. (Patient not taking: Reported on 02/13/2017) 30 tablet 3  . [DISCONTINUED] azithromycin (ZITHROMAX) 250 MG tablet One daily x 4 days. (Patient not taking: Reported on 02/13/2017) 4 each 0  . [DISCONTINUED] carvedilol (COREG) 3.125 MG tablet Take 1 tablet (  3.125 mg total) by mouth 2 (two) times daily with a meal. (Patient not taking: Reported on 02/13/2017) 60 tablet 3  . [DISCONTINUED] guaiFENesin (MUCINEX) 600 MG 12 hr tablet Take 1 tablet (600 mg total) by mouth 2 (two) times daily. (Patient not taking: Reported on 02/13/2017) 30  tablet 0  . [DISCONTINUED] ibuprofen (ADVIL,MOTRIN) 400 MG tablet Take 1 tablet (400 mg total) by mouth 3 (three) times daily. (Patient not taking: Reported on 02/13/2017) 30 tablet 0  . [DISCONTINUED] losartan (COZAAR) 25 MG tablet Take 1 tablet (25 mg total) by mouth daily. (Patient not taking: Reported on 02/13/2017) 30 tablet 3    Home: Home Living Family/patient expects to be discharged to:: Private residence Living Arrangements: Non-relatives/Friends Available Help at Discharge: Family, Friend(s), Available PRN/intermittently (pt states someone will be around to help if needed) Type of Home: House Home Access: Stairs to enter [employer] of Steps: several Entrance Stairs-Rails: Right, Left Home Layout: Two level, Able to live on main level with bedroom/bathroom Alternate Level Stairs-Number of Steps: flights Alternate Level Stairs-Rails: Right, Left Bathroom Shower/Tub: Chiropodist: Standard Home Equipment: Grab bars - tub/shower  Lives With: Significant other  Functional History: Prior Function Level of Independence: Independent Comments: works, drives, runs errands, helps with household Functional Status:  Mobility: Bed Mobility Overal bed mobility: Needs Assistance Bed Mobility: Supine to Sit, Sit to Supine Supine to sit: Min guard Sit to supine: Supervision General bed mobility comments: slow and effortful to move to EOB, use of UE's, but no rail or assist.  Easier scooting in bed and getting from sit to supine Transfers Overall transfer level: Needs assistance Transfers: Sit to/from Stand, Stand Pivot Transfers Sit to Stand: Min assist Stand pivot transfers: Min assist General transfer comment: use of hands and transition uncoordinated Ambulation/Gait Ambulation/Gait assistance: Min assist Ambulation Distance (Feet): 70 Feet Assistive device: 1 person hand held assist (rail) Gait Pattern/deviations: Step-through pattern, Decreased  step length - right, Decreased step length - left, Decreased stance time - right, Decreased stride length, Wide base of support General Gait Details: hemiparetic gait with minimal toe off and flat foot contact.  Improving with heel/toe practice, but unccordinated, needing rail for stability and min stability assist. Gait velocity interpretation: Below normal speed for age/gender    ADL:    Cognition: Cognition Overall Cognitive Status: Within Functional Limits for tasks assessed Orientation Level: Oriented X4 Cognition Arousal/Alertness: Awake/alert Behavior During Therapy: WFL for tasks assessed/performed Overall Cognitive Status: Within Functional Limits for tasks assessed  Blood pressure (!) 141/79, pulse (!) 51, temperature 98 F (36.7 C), temperature source Oral, resp. rate 18, height 6\' 1"  (1.854 m), weight 65.8 kg (145 lb), SpO2 98 %. Physical Exam  Vitals reviewed. Constitutional: He is oriented to person, place, and time. He appears well-developed.  HENT:  Head: Normocephalic.  Eyes: EOM are normal.  Neck: Normal range of motion. Neck supple. No thyromegaly present.  Cardiovascular: Normal rate, regular rhythm and normal heart sounds.   Respiratory: Effort normal and breath sounds normal. No respiratory distress.  GI: Soft. Bowel sounds are normal. He exhibits no distension.  Musculoskeletal: He exhibits no edema or tenderness.  Neurological: He is alert and oriented to person, place, and time.   Makes good eyecontact with examiner. Follows full commands. Mild right central 7. Sensory loss mild in face arm and more pronounced in right leg. RUE 3+ to 4-/5. RLE: 3/5 HF,KE and 2/5 ADF/PF. LUE andLLE 5/5. Cognitively displays good insight and awareness  Skin: Skin is warm and dry.  Psychiatric: He has a normal mood and affect. His behavior is normal.    No results found for this or any previous visit (from the past 24 hour(s)). Ct Angio Head W Or Wo Contrast  Result Date:  02/13/2017 CLINICAL DATA:  Subjective RIGHT-sided numbness and weakness. History of cocaine abuse. History of marijuana use. EXAM: CT ANGIOGRAPHY HEAD AND NECK TECHNIQUE: Multidetector CT imaging of the head and neck was performed using the standard protocol during bolus administration of intravenous contrast. Multiplanar CT image reconstructions and MIPs were obtained to evaluate the vascular anatomy. Carotid stenosis measurements (when applicable) are obtained utilizing NASCET criteria, using the distal internal carotid diameter as the denominator. CONTRAST:  Overall volume 100 mL Isovue 370, and given at 2 increments. COMPARISON:  CT head earlier in the day. FINDINGS: CTA NECK Aortic arch: Standard branching. Imaged portion shows no evidence of aneurysm or dissection. No significant stenosis of the major arch vessel origins. Right carotid system: Minor atheromatous change at the bifurcation. No evidence of dissection, stenosis (50% or greater) or occlusion. Left carotid system: No significant atheromatous change at the bifurcation. No evidence of dissection, stenosis (50% or greater) or occlusion. Vertebral arteries: Codominant. No evidence of dissection, stenosis (50% or greater) or occlusion. Nonvascular soft tissues: Spondylosis. Poor dentition. No lung apex lesion. Emphysema. No neck masses. CTA HEAD Anterior circulation: Calcification of the cavernous internal carotid arteries consistent with cerebrovascular atherosclerotic disease. No significant ICA/ACA/MCA stenosis, proximal occlusion, aneurysm, or vascular malformation. Posterior circulation: Codominant vertebral arteries supply the basilar, all widely patent. No significant stenosis, proximal occlusion, aneurysm, or vascular malformation. Venous sinuses: As permitted by contrast timing, patent. Anatomic variants: None of significance. Delayed phase:   No abnormal intracranial enhancement. IMPRESSION: Minor atheromatous change, but no flow reducing  intracranial or extracranial stenosis. Specifically, no signs of large vessel occlusion. These results were called by telephone at the time of interpretation on 02/13/2017 at 12;17 pm to Dr. Amie Portland , who verbally acknowledged these results. Electronically Signed   By: Staci Righter M.D.   On: 02/13/2017 13:15   Dg Lumbar Spine 2-3 Views  Result Date: 02/14/2017 CLINICAL DATA:  Right-sided low back pain for 3-4 days. No known injury. EXAM: LUMBAR SPINE - 2-3 VIEW COMPARISON:  Lumbar spine MRI dated September 11, 2015. FINDINGS: Transitional lumbosacral anatomy again noted. No acute fracture or subluxation. Vertebral body heights are preserved. Stable mild disc height loss at L4-L5 and L5-S1. Mild lower lumbar facet arthropathy. Atherosclerotic vascular calcifications. IMPRESSION: 1. Mild degenerative changes of the lower lumbar spine, similar to prior study. No acute osseous abnormality. Electronically Signed   By: Titus Dubin M.D.   On: 02/14/2017 16:49   Ct Angio Neck W Or Wo Contrast  Result Date: 02/13/2017 CLINICAL DATA:  Subjective RIGHT-sided numbness and weakness. History of cocaine abuse. History of marijuana use. EXAM: CT ANGIOGRAPHY HEAD AND NECK TECHNIQUE: Multidetector CT imaging of the head and neck was performed using the standard protocol during bolus administration of intravenous contrast. Multiplanar CT image reconstructions and MIPs were obtained to evaluate the vascular anatomy. Carotid stenosis measurements (when applicable) are obtained utilizing NASCET criteria, using the distal internal carotid diameter as the denominator. CONTRAST:  Overall volume 100 mL Isovue 370, and given at 2 increments. COMPARISON:  CT head earlier in the day. FINDINGS: CTA NECK Aortic arch: Standard branching. Imaged portion shows no evidence of aneurysm or dissection. No significant stenosis of the major arch vessel origins.  Right carotid system: Minor atheromatous change at the bifurcation. No evidence  of dissection, stenosis (50% or greater) or occlusion. Left carotid system: No significant atheromatous change at the bifurcation. No evidence of dissection, stenosis (50% or greater) or occlusion. Vertebral arteries: Codominant. No evidence of dissection, stenosis (50% or greater) or occlusion. Nonvascular soft tissues: Spondylosis. Poor dentition. No lung apex lesion. Emphysema. No neck masses. CTA HEAD Anterior circulation: Calcification of the cavernous internal carotid arteries consistent with cerebrovascular atherosclerotic disease. No significant ICA/ACA/MCA stenosis, proximal occlusion, aneurysm, or vascular malformation. Posterior circulation: Codominant vertebral arteries supply the basilar, all widely patent. No significant stenosis, proximal occlusion, aneurysm, or vascular malformation. Venous sinuses: As permitted by contrast timing, patent. Anatomic variants: None of significance. Delayed phase:   No abnormal intracranial enhancement. IMPRESSION: Minor atheromatous change, but no flow reducing intracranial or extracranial stenosis. Specifically, no signs of large vessel occlusion. These results were called by telephone at the time of interpretation on 02/13/2017 at 12;17 pm to Dr. Amie Portland , who verbally acknowledged these results. Electronically Signed   By: Staci Righter M.D.   On: 02/13/2017 13:15   Mr Brain Wo Contrast  Result Date: 02/14/2017 CLINICAL DATA:  Acute onset RIGHT-sided weakness tingling and numbness this morning ; symptoms improving. Persistent RIGHT leg heaviness. History of hypertension, smoker. EXAM: MRI HEAD WITHOUT CONTRAST TECHNIQUE: Multiplanar, multiecho pulse sequences of the brain and surrounding structures were obtained without intravenous contrast. COMPARISON:  CT HEAD February 13, 2017 FINDINGS: BRAIN: 3 mm focus LEFT ventral pons without corresponding ADC abnormality, possibly due to small size with faint FLAIR T2 hyperintensity. No susceptibility artifact to  suggest hemorrhage. The ventricles and sulci are normal for patient's age. Scattered supratentorial subcentimeter white matter T2 hyperintensities, with minimal cystic component. No suspicious parenchymal signal, mass or mass effect. No abnormal extra-axial fluid collections. VASCULAR: Normal major intracranial vascular flow voids present at skull base. SKULL AND UPPER CERVICAL SPINE: No abnormal sellar expansion. No suspicious calvarial bone marrow signal. Craniocervical junction maintained. SINUSES/ORBITS: Trace paranasal sinus mucosal thickening. Small RIGHT maxillary mucosal retention cyst. Mastoid air cells are well aerated. AP all Set 8. The included ocular globes and orbital contents are non-suspicious. OTHER: None. IMPRESSION: 1. Tiny pontine acute versus subacute infarct. 2. Mild chronic small vessel ischemic disease. Electronically Signed   By: Elon Alas M.D.   On: 02/14/2017 05:04   Ct Head Code Stroke Wo Contrast  Result Date: 02/13/2017 CLINICAL DATA:  Code stroke. RIGHT-sided weakness and numbness, last seen normal at 0815 hours. EXAM: CT HEAD WITHOUT CONTRAST TECHNIQUE: Contiguous axial images were obtained from the base of the skull through the vertex without intravenous contrast. COMPARISON:  MR brain 09/11/2015. FINDINGS: Brain: No evidence for acute infarction, hemorrhage, mass lesion, hydrocephalus, or extra-axial fluid. Normal cerebral volume. No white matter disease. Vascular: No hyperdense vessel or unexpected calcification. Skull: Normal. Negative for fracture or focal lesion. Sinuses/Orbits: No acute finding. Other: None. ASPECTS [facility] Stroke Program Early CT Score) - Ganglionic level infarction (caudate, lentiform nuclei, internal capsule, insula, M1-M3 cortex): 7 - Supraganglionic infarction (M4-M6 cortex): 3 Total score (0-10 with 10 being normal): 10 IMPRESSION: 1. No acute intracranial findings. No features suggestive of large vessel occlusion. 2. ASPECTS is 10 These  results were called by telephone at the time of interpretation on 02/13/2017 at 12:15 pm to Dr. Rory Percy , who verbally acknowledged these results. Electronically Signed   By: Staci Righter M.D.   On: 02/13/2017 12:19    Assessment/Plan: Diagnosis:  left pontine infarct 1. Does the need for close, 24 hr/day medical supervision in concert with the patient's rehab needs make it unreasonable for this patient to be served in a less intensive setting? Yes 2. Co-Morbidities requiring supervision/potential complications: htn, cad 3. Due to bladder management, bowel management, safety, skin/wound care, disease management, medication administration, pain management and patient education, does the patient require 24 hr/day rehab nursing? Yes 4. Does the patient require coordinated care of a physician, rehab nurse, PT (1-2 hrs/day, 5 days/week) and OT (1-2 hrs/day, 5 days/week) to address physical and functional deficits in the context of the above medical diagnosis(es)? Yes Addressing deficits in the following areas: balance, endurance, locomotion, strength, transferring, bowel/bladder control, bathing, dressing, feeding, grooming, toileting and psychosocial support 5. Can the patient actively participate in an intensive therapy program of at least 3 hrs of therapy per day at least 5 days per week? Yes 6. The potential for patient to make measurable gains while on inpatient rehab is excellent 7. Anticipated functional outcomes upon discharge from inpatient rehab are modified independent  with PT, modified independent with OT, n/a with SLP. 8. Estimated rehab length of stay to reach the above functional goals is: 7 days 9. Anticipated D/C setting: Home 10. Anticipated post D/C treatments: HH therapy and Outpatient therapy 11. Overall Rehab/Functional Prognosis: excellent  RECOMMENDATIONS: This patient's condition is appropriate for continued rehabilitative care in the following setting: CIR Patient has agreed to  participate in recommended program. Yes Note that insurance prior authorization may be required for reimbursement for recommended care.  Comment: Pt is motivated. Wife works. Mod I goals.  Meredith Staggers, MD, [facility] 02/15/2017    Cathlyn Parsons., PA-C 02/15/2017

## 2017-02-16 ENCOUNTER — Inpatient Hospital Stay (HOSPITAL_COMMUNITY): Payer: Self-pay

## 2017-02-16 ENCOUNTER — Other Ambulatory Visit (HOSPITAL_COMMUNITY): Payer: Self-pay

## 2017-02-16 LAB — ECHOCARDIOGRAM COMPLETE
HEIGHTINCHES: 73 in
Weight: 2320 oz

## 2017-02-16 MED ORDER — ATORVASTATIN CALCIUM 40 MG PO TABS: 40.0000 mg | ORAL_TABLET | Freq: Every day | ORAL | 0 refills | Status: DC

## 2017-02-16 MED ORDER — CLOPIDOGREL BISULFATE 75 MG PO TABS: 75.0000 mg | ORAL_TABLET | Freq: Every day | ORAL | 0 refills | Status: AC

## 2017-02-16 MED ORDER — ATORVASTATIN CALCIUM 40 MG PO TABS: 40.0000 mg | ORAL_TABLET | Freq: Every day | ORAL | 3 refills | Status: DC

## 2017-02-16 MED ORDER — NICOTINE 21 MG/24HR TD PT24: 21.0000 mg | MEDICATED_PATCH | Freq: Every day | TRANSDERMAL | 0 refills | Status: DC

## 2017-02-16 MED ORDER — CLOPIDOGREL BISULFATE 75 MG PO TABS
75.0000 mg | ORAL_TABLET | Freq: Every day | ORAL | Status: DC
  Filled 2017-02-16: qty 1

## 2017-02-16 MED ORDER — METHOCARBAMOL 750 MG PO TABS: 750.0000 mg | ORAL_TABLET | Freq: Three times a day (TID) | ORAL | 0 refills | Status: DC | PRN

## 2017-02-16 MED ORDER — IBUPROFEN 600 MG PO TABS: 600.0000 mg | ORAL_TABLET | Freq: Four times a day (QID) | ORAL | 0 refills | Status: DC | PRN

## 2017-02-16 NOTE — Progress Notes (Signed)
  Echocardiogram 2D Echocardiogram has been performed.  Merrie Roof F 02/16/2017, 10:00 AM

## 2017-02-16 NOTE — Progress Notes (Signed)
Physical Therapy Treatment Patient Details Name: [name] MRN: [id number] DOB: 1951/02/23 Today's Date: 02/16/2017    History of Present Illness Pt is a 66 y.o. male with medical history significant for chronic low back pain, CAD status post MI in 2011, history of bradycardia, hypertension, hyperlipidemia, tobacco abuse, recreational marijuana use, presenting with sudden onset of right-sided weakness and numbness/tingling. Since presentation, symptoms have in improved, without resolution. He reports the heaviness in the right leg remaining. 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    PT Comments    Progressing well with gait and use of the cane, but needing more practice.   Follow Up Recommendations  Home health PT     Equipment Recommendations  Cane    Recommendations for Other Services       Precautions / Restrictions Precautions Precautions: Fall    Mobility  Bed Mobility Overal bed mobility: Needs Assistance Bed Mobility: Rolling;Sidelying to Sit Rolling: Modified independent (Device/Increase time) Sidelying to sit: Modified independent (Device/Increase time)          Transfers Overall transfer level: Needs assistance Equipment used: Straight cane Transfers: Sit to/from Stand Sit to Stand: Supervision         General transfer comment: reinforced safe technique  Ambulation/Gait Ambulation/Gait assistance: Supervision Ambulation Distance (Feet): 250 Feet Assistive device: Straight cane Gait Pattern/deviations: Step-through pattern   Gait velocity interpretation: at or above normal speed for age/gender (prefers slower) General Gait Details: more steady with improving use of cane.  Further practice with cane.   Stairs            Wheelchair Mobility    Modified Rankin (Stroke Patients Only) Modified Rankin (Stroke Patients Only) Pre-Morbid Rankin Score: No symptoms Modified Rankin: Moderate disability     Balance      Sitting balance-Leahy Scale: Good       Standing balance-Leahy Scale: Fair                              Cognition Arousal/Alertness: Awake/alert Behavior During Therapy: WFL for tasks assessed/performed Overall Cognitive Status: Within Functional Limits for tasks assessed                                        Exercises      General Comments General comments (skin integrity, edema, etc.): checked with case management to see if pt can get a cane from indigent fund [facility]      Pertinent Vitals/Pain Pain Assessment: No/denies pain    Home Living                      Prior Function            PT Goals (current goals can now be found in the care plan section) Acute Rehab PT Goals Patient Stated Goal: back to work, Independent PT Goal Formulation: With patient Time For Goal Achievement: 02/21/17 Potential to Achieve Goals: Good Progress towards PT goals: Progressing toward goals    Frequency    Min 3X/week      PT Plan Discharge plan needs to be updated    Co-evaluation              AM-PAC PT "6 Clicks" Daily Activity  Outcome Measure  Difficulty turning over in bed (including adjusting bedclothes, sheets and blankets)?: None Difficulty  moving from lying on back to sitting on the side of the bed? : A Little Difficulty sitting down on and standing up from a chair with arms (e.g., wheelchair, bedside commode, etc,.)?: A Little Help needed moving to and from a bed to chair (including a wheelchair)?: A Little Help needed walking in hospital room?: A Little Help needed climbing 3-5 steps with a railing? : A Little 6 Click Score: 19    End of Session   Activity Tolerance: Patient tolerated treatment well Patient left: in bed;with call bell/phone within reach;with bed alarm set Nurse Communication: Mobility status PT Visit Diagnosis: Unsteadiness on feet (R26.81);Hemiplegia and hemiparesis Hemiplegia - Right/Left:  Right Hemiplegia - dominant/non-dominant: Dominant Hemiplegia - caused by: Cerebral infarction     Time: 2122-4825 PT Time Calculation (min) (ACUTE ONLY): 17 min  Charges:  $Gait Training: 8-22 mins                    G Codes:       Feb 27, 2017  Donnella Sham, PT [phone]  (pager)   Tessie Fass Laurella Tull 2017-02-27, 4:19 PM

## 2017-02-16 NOTE — Discharge Summary (Signed)
Pt discharged home with family. Home with private vehicle. Wife to take patient home. Pt discharged this afternoon but pt was unable to get scripts till Monday so I made arrangement with MD and pharmacy here to get 3 day supply of plavix till Monday, so this is reasoning for such late discharge. AVS discussed with patient and patient verbalize understanding. Alert and orient on discharge and skin  intact

## 2017-02-16 NOTE — Care Management Note (Signed)
Case Management Note  Patient Details  Name: [name] MRN: [id number] Date of Birth: 07-Dec-1950  Subjective/Objective:    Presented with R leg numbness and tingling sensation found to have an acute pontine infarct ,history of chronic back pain, CAD status post MI, hypertension, tobacco and cocaine/ marijuana use. LIves with finance'.        Ignatius Specking ([place])     [phone]       PCP: Dr.Hawani  Action/Plan: Pt concerned about CIR cost, "cant' afford". Plan is to d/c to home.when medically stable.  Pt approved for home health services (charity case), pt without insurance. CM will provide pt with Match Letter to assist with Rx meds if needed. Transportation to home will be provided by family.  Expected Discharge Date:                  Expected Discharge Plan:     In-House Referral:     Discharge planning Services  CM Consult  Post Acute Care Choice:    Choice offered to:  Patient  DME Arranged:    DME Agency:     HH Arranged:   RN,PT,SW [place]:  [place] ( charity case0  Status of Service:  Completed, signed off  If discussed at [place] of Stay Meetings, dates discussed:    Additional Comments:  Sharin Mons, RN 02/16/2017, 8:56 AM

## 2017-02-16 NOTE — Discharge Summary (Signed)
Physician Discharge Summary  [name]  WUJ:[id number]  DOB: 02-17-1951  DOA: 02/13/2017 PCP: Charolette Forward, MD  Admit date: 02/13/2017 Discharge date: 02/16/2017  Admitted From: Home  Disposition: Home   Recommendations for Outpatient Follow-up:  1. Follow up with PCP in 1 weeks 2. Please obtain BMP/CBC in one week  3. Follow up with Neurology in 6 weeks Dr Leonie Man   Home Health: PT/OT SW Equipment/Devices: cane   Discharge Condition: Stable  CODE STATUS: Full code  Diet recommendation: Heart Healthy   Brief/Interim Summary: [name] is 66 y/o male with PMHx of CAD, HTN, bradycardia and tobacco abuse. Patient presented to the ED c/o R leg numbness and tingling sensation. Patient was admitted for working of CVA - MRI reveal acute pontine infarct. Neurology was consulted.  Subjective: Patient seen and examined, no complaints, back feeling better. No acute events overnight   Discharge Diagnoses/Hospital Course:  CVA  RLE weakness and ataxia  Not TPA candidate  MRI show pontine acute/subacute infarct - small vessels diseases  ECHO EF 55-60 %  CTA H/N - no signs of large vessel occlusion, minor atheromatous changes  Discussed case with PT - recommending CIR - patient can't afford copayment request to be d/c home with HHPT  Switched ASA to plavix Continue Lipitor  Follow up with stroke clinic in 6 weeks.  A1C 5.8 Smoking cessation discussed   Bradycardia - Chronic  Follow with Dr Doylene Canard  Asymptomatic  Continue to monitor   HTN - not on meds  Allow permissive HTN - BP acceptable  BP will normalize in the next few days - continue to monitor  Monitor, follow up with PCP   Back pain - msk - muscle spasm  Lumbar xray - chronic degenerative changes  Robaxin 750 mg TID PRN  PT   Tobacco abuse  Nicotine patch  Counseled cessation discussed  All other chronic medical condition were stable during the hospitalization.  Patient was seen by physical therapy,   recommending CIR - patient can't afford copayment request to be d/c home with HHPT  On the day of the discharge the patient's vitals were stable, and no other acute medical condition were reported by patient. Patient was felt safe to be discharge to home   Discharge Instructions  You were cared for by a hospitalist during your hospital stay. If you have any questions about your discharge medications or the care you received while you were in the hospital after you are discharged, you can call the unit and asked to speak with the hospitalist on call if the hospitalist that took care of you is not available. Once you are discharged, your primary care physician will handle any further medical issues. Please note that NO REFILLS for any discharge medications will be authorized once you are discharged, as it is imperative that you return to your primary care physician (or establish a relationship with a primary care physician if you do not have one) for your aftercare needs so that they can reassess your need for medications and monitor your lab values.  Discharge Instructions    Call MD for:  difficulty breathing, headache or visual disturbances    Complete by:  As directed    Call MD for:  extreme fatigue    Complete by:  As directed    Call MD for:  hives    Complete by:  As directed    Call MD for:  persistant dizziness or light-headedness    Complete by:  As directed    Call MD for:  persistant nausea and vomiting    Complete by:  As directed    Call MD for:  redness, tenderness, or signs of infection (pain, swelling, redness, odor or green/yellow discharge around incision site)    Complete by:  As directed    Call MD for:  severe uncontrolled pain    Complete by:  As directed    Call MD for:  temperature >100.4    Complete by:  As directed    Diet - low sodium heart healthy    Complete by:  As directed    Increase activity slowly    Complete by:  As directed      Allergies as of  02/16/2017   No Known Allergies     Medication List    STOP taking these medications   amLODipine 5 MG tablet Commonly known as:  NORVASC   aspirin 325 MG tablet   azithromycin 250 MG tablet Commonly known as:  ZITHROMAX   carvedilol 3.125 MG tablet Commonly known as:  COREG   guaiFENesin 600 MG 12 hr tablet Commonly known as:  MUCINEX   losartan 25 MG tablet Commonly known as:  COZAAR     TAKE these medications   atorvastatin 40 MG tablet Commonly known as:  LIPITOR Take 1 tablet (40 mg total) by mouth daily.   atorvastatin 40 MG tablet Commonly known as:  LIPITOR Take 1 tablet (40 mg total) by mouth daily at 6 PM.   clopidogrel 75 MG tablet Commonly known as:  PLAVIX Take 1 tablet (75 mg total) by mouth daily.   ibuprofen 600 MG tablet Commonly known as:  MOTRIN IB Take 1 tablet (600 mg total) by mouth every 6 (six) hours as needed. What changed:  medication strength  how much to take  when to take this  reasons to take this   methocarbamol 750 MG tablet Commonly known as:  ROBAXIN Take 1 tablet (750 mg total) by mouth every 8 (eight) hours as needed for muscle spasms.   nicotine 21 mg/24hr patch Commonly known as:  NICODERM CQ - dosed in mg/24 hours Place 1 patch (21 mg total) onto the skin daily.      Follow-up Information    [place]. Call on 02/19/2017.   Why:  Please call Monday at 8:30am, 10/8, to scheduled hospital post follow up appointment Contact information: [place] [phone]       Garvin Fila, MD. Schedule an appointment as soon as possible for a visit in 6 week(s).   Specialties:  Neurology, Radiology Why:  Hospital follow up  Contact information: [address] [place] [address] [phone]          No Known Allergies  Consultations:  Neurology    Procedures/Studies: Ct Angio Head W Or Wo Contrast  Result  Date: 02/13/2017 CLINICAL DATA:  Subjective RIGHT-sided numbness and weakness. History of cocaine abuse. History of marijuana use. EXAM: CT ANGIOGRAPHY HEAD AND NECK TECHNIQUE: Multidetector CT imaging of the head and neck was performed using the standard protocol during bolus administration of intravenous contrast. Multiplanar CT image reconstructions and MIPs were obtained to evaluate the vascular anatomy. Carotid stenosis measurements (when applicable) are obtained utilizing NASCET criteria, using the distal internal carotid diameter as the denominator. CONTRAST:  Overall volume 100 mL Isovue 370, and given at 2 increments. COMPARISON:  CT head earlier in the  day. FINDINGS: CTA NECK Aortic arch: Standard branching. Imaged portion shows no evidence of aneurysm or dissection. No significant stenosis of the major arch vessel origins. Right carotid system: Minor atheromatous change at the bifurcation. No evidence of dissection, stenosis (50% or greater) or occlusion. Left carotid system: No significant atheromatous change at the bifurcation. No evidence of dissection, stenosis (50% or greater) or occlusion. Vertebral arteries: Codominant. No evidence of dissection, stenosis (50% or greater) or occlusion. Nonvascular soft tissues: Spondylosis. Poor dentition. No lung apex lesion. Emphysema. No neck masses. CTA HEAD Anterior circulation: Calcification of the cavernous internal carotid arteries consistent with cerebrovascular atherosclerotic disease. No significant ICA/ACA/MCA stenosis, proximal occlusion, aneurysm, or vascular malformation. Posterior circulation: Codominant vertebral arteries supply the basilar, all widely patent. No significant stenosis, proximal occlusion, aneurysm, or vascular malformation. Venous sinuses: As permitted by contrast timing, patent. Anatomic variants: None of significance. Delayed phase:   No abnormal intracranial enhancement. IMPRESSION: Minor atheromatous change, but no flow reducing  intracranial or extracranial stenosis. Specifically, no signs of large vessel occlusion. These results were called by telephone at the time of interpretation on 02/13/2017 at 12;17 pm to Dr. Amie Portland , who verbally acknowledged these results. Electronically Signed   By: Staci Righter M.D.   On: 02/13/2017 13:15   Dg Lumbar Spine 2-3 Views  Result Date: 02/14/2017 CLINICAL DATA:  Right-sided low back pain for 3-4 days. No known injury. EXAM: LUMBAR SPINE - 2-3 VIEW COMPARISON:  Lumbar spine MRI dated September 11, 2015. FINDINGS: Transitional lumbosacral anatomy again noted. No acute fracture or subluxation. Vertebral body heights are preserved. Stable mild disc height loss at L4-L5 and L5-S1. Mild lower lumbar facet arthropathy. Atherosclerotic vascular calcifications. IMPRESSION: 1. Mild degenerative changes of the lower lumbar spine, similar to prior study. No acute osseous abnormality. Electronically Signed   By: Titus Dubin M.D.   On: 02/14/2017 16:49   Ct Angio Neck W Or Wo Contrast  Result Date: 02/13/2017 CLINICAL DATA:  Subjective RIGHT-sided numbness and weakness. History of cocaine abuse. History of marijuana use. EXAM: CT ANGIOGRAPHY HEAD AND NECK TECHNIQUE: Multidetector CT imaging of the head and neck was performed using the standard protocol during bolus administration of intravenous contrast. Multiplanar CT image reconstructions and MIPs were obtained to evaluate the vascular anatomy. Carotid stenosis measurements (when applicable) are obtained utilizing NASCET criteria, using the distal internal carotid diameter as the denominator. CONTRAST:  Overall volume 100 mL Isovue 370, and given at 2 increments. COMPARISON:  CT head earlier in the day. FINDINGS: CTA NECK Aortic arch: Standard branching. Imaged portion shows no evidence of aneurysm or dissection. No significant stenosis of the major arch vessel origins. Right carotid system: Minor atheromatous change at the bifurcation. No evidence  of dissection, stenosis (50% or greater) or occlusion. Left carotid system: No significant atheromatous change at the bifurcation. No evidence of dissection, stenosis (50% or greater) or occlusion. Vertebral arteries: Codominant. No evidence of dissection, stenosis (50% or greater) or occlusion. Nonvascular soft tissues: Spondylosis. Poor dentition. No lung apex lesion. Emphysema. No neck masses. CTA HEAD Anterior circulation: Calcification of the cavernous internal carotid arteries consistent with cerebrovascular atherosclerotic disease. No significant ICA/ACA/MCA stenosis, proximal occlusion, aneurysm, or vascular malformation. Posterior circulation: Codominant vertebral arteries supply the basilar, all widely patent. No significant stenosis, proximal occlusion, aneurysm, or vascular malformation. Venous sinuses: As permitted by contrast timing, patent. Anatomic variants: None of significance. Delayed phase:   No abnormal intracranial enhancement. IMPRESSION: Minor atheromatous change, but no flow reducing intracranial  or extracranial stenosis. Specifically, no signs of large vessel occlusion. These results were called by telephone at the time of interpretation on 02/13/2017 at 12;17 pm to Dr. Amie Portland , who verbally acknowledged these results. Electronically Signed   By: Staci Righter M.D.   On: 02/13/2017 13:15   Mr Brain Wo Contrast  Result Date: 02/14/2017 CLINICAL DATA:  Acute onset RIGHT-sided weakness tingling and numbness this morning ; symptoms improving. Persistent RIGHT leg heaviness. History of hypertension, smoker. EXAM: MRI HEAD WITHOUT CONTRAST TECHNIQUE: Multiplanar, multiecho pulse sequences of the brain and surrounding structures were obtained without intravenous contrast. COMPARISON:  CT HEAD February 13, 2017 FINDINGS: BRAIN: 3 mm focus LEFT ventral pons without corresponding ADC abnormality, possibly due to small size with faint FLAIR T2 hyperintensity. No susceptibility artifact to  suggest hemorrhage. The ventricles and sulci are normal for patient's age. Scattered supratentorial subcentimeter white matter T2 hyperintensities, with minimal cystic component. No suspicious parenchymal signal, mass or mass effect. No abnormal extra-axial fluid collections. VASCULAR: Normal major intracranial vascular flow voids present at skull base. SKULL AND UPPER CERVICAL SPINE: No abnormal sellar expansion. No suspicious calvarial bone marrow signal. Craniocervical junction maintained. SINUSES/ORBITS: Trace paranasal sinus mucosal thickening. Small RIGHT maxillary mucosal retention cyst. Mastoid air cells are well aerated. AP all Set 8. The included ocular globes and orbital contents are non-suspicious. OTHER: None. IMPRESSION: 1. Tiny pontine acute versus subacute infarct. 2. Mild chronic small vessel ischemic disease. Electronically Signed   By: Elon Alas M.D.   On: 02/14/2017 05:04   Ct Head Code Stroke Wo Contrast  Result Date: 02/13/2017 CLINICAL DATA:  Code stroke. RIGHT-sided weakness and numbness, last seen normal at 0815 hours. EXAM: CT HEAD WITHOUT CONTRAST TECHNIQUE: Contiguous axial images were obtained from the base of the skull through the vertex without intravenous contrast. COMPARISON:  MR brain 09/11/2015. FINDINGS: Brain: No evidence for acute infarction, hemorrhage, mass lesion, hydrocephalus, or extra-axial fluid. Normal cerebral volume. No white matter disease. Vascular: No hyperdense vessel or unexpected calcification. Skull: Normal. Negative for fracture or focal lesion. Sinuses/Orbits: No acute finding. Other: None. ASPECTS [facility] Stroke Program Early CT Score) - Ganglionic level infarction (caudate, lentiform nuclei, internal capsule, insula, M1-M3 cortex): 7 - Supraganglionic infarction (M4-M6 cortex): 3 Total score (0-10 with 10 being normal): 10 IMPRESSION: 1. No acute intracranial findings. No features suggestive of large vessel occlusion. 2. ASPECTS is 10 These  results were called by telephone at the time of interpretation on 02/13/2017 at 12:15 pm to Dr. Rory Percy , who verbally acknowledged these results. Electronically Signed   By: Staci Righter M.D.   On: 02/13/2017 12:19   ECHO  ------------------------------------------------------------------- Study Conclusions  - Left ventricle: The cavity size was normal. Systolic function was   normal. The estimated ejection fraction was in the range of 55%   to 60%. Wall motion was normal; there were no regional wall   motion abnormalities. - Mitral valve: There was mild regurgitation directed posteriorly. - Atrial septum: No defect or patent foramen ovale was identified.   Discharge Exam: Vitals:   02/16/17 0525 02/16/17 1019  BP: (!) 153/79 (!) 158/71  Pulse: (!) 54 (!) 56  Resp:  16  Temp:  98.4 F (36.9 C)  SpO2:  100%   Vitals:   02/15/17 2230 02/16/17 0524 02/16/17 0525 02/16/17 1019  BP: 129/68 (!) 151/88 (!) 153/79 (!) 158/71  Pulse: (!) 52 (!) 53 (!) 54 (!) 56  Resp: 17 16  16   Temp: 98.4  F (36.9 C) 98.2 F (36.8 C)  98.4 F (36.9 C)  TempSrc: Oral Oral  Oral  SpO2: 98% 100%  100%  Weight:      Height:        General: Pt is alert, awake, not in acute distress Cardiovascular: RRR, S1/S2 +, no rubs, no gallops Respiratory: CTA bilaterally, no wheezing, no rhonchi Abdominal: Soft, NT, ND, bowel sounds + Extremities: no edema, no cyanosis   The results of significant diagnostics from this hospitalization (including imaging, microbiology, ancillary and laboratory) are listed below for reference.     Microbiology: No results found for this or any previous visit (from the past 240 hour(s)).   Labs: BNP (last 3 results) No results for input(s): BNP in the last 8760 hours. Basic Metabolic Panel:  Recent Labs Lab 02/13/17 0951 02/13/17 1156  NA 137 140  K 3.9 3.9  CL 105 104  CO2 24  --   GLUCOSE 83 81  BUN 8 11  CREATININE 1.00 0.90  CALCIUM 9.0  --    Liver  Function Tests: No results for input(s): AST, ALT, ALKPHOS, BILITOT, PROT, ALBUMIN in the last 168 hours. No results for input(s): LIPASE, AMYLASE in the last 168 hours. No results for input(s): AMMONIA in the last 168 hours. CBC:  Recent Labs Lab 02/13/17 0951 02/13/17 1140 02/13/17 1156  WBC 6.8  --   --   NEUTROABS  --  2.9  --   HGB 12.6*  --  14.6  HCT 38.7*  --  43.0  MCV 72.1*  --   --   PLT 254  --   --    Cardiac Enzymes:  Recent Labs Lab 02/13/17 1140  TROPONINI <0.03   BNP: Invalid input(s): POCBNP CBG: No results for input(s): GLUCAP in the last 168 hours. D-Dimer No results for input(s): DDIMER in the last 72 hours. Hgb A1c  Recent Labs  02/14/17 0536  HGBA1C 5.8*   Lipid Profile  Recent Labs  02/14/17 0536  CHOL 153  HDL 64  LDLCALC 78  TRIG 56  CHOLHDL 2.4   Thyroid function studies No results for input(s): TSH, T4TOTAL, T3FREE, THYROIDAB in the last 72 hours.  Invalid input(s): FREET3 Anemia work up  Recent Labs  02/13/17 1628  VITAMINB12 260  FOLATE 14.1  FERRITIN 71  TIBC 256  IRON 115  RETICCTPCT 0.8   Urinalysis    Component Value Date/Time   COLORURINE STRAW (A) 02/13/2017 1249   APPEARANCEUR CLEAR 02/13/2017 1249   LABSPEC 1.013 02/13/2017 1249   PHURINE 6.0 02/13/2017 1249   GLUCOSEU NEGATIVE 02/13/2017 1249   HGBUR NEGATIVE 02/13/2017 1249   BILIRUBINUR NEGATIVE 02/13/2017 1249   KETONESUR NEGATIVE 02/13/2017 1249   PROTEINUR NEGATIVE 02/13/2017 1249   UROBILINOGEN 0.2 11/21/2010 1200   NITRITE NEGATIVE 02/13/2017 1249   LEUKOCYTESUR TRACE (A) 02/13/2017 1249   Sepsis Labs Invalid input(s): PROCALCITONIN,  WBC,  LACTICIDVEN Microbiology No results found for this or any previous visit (from the past 240 hour(s)).   Time coordinating discharge: 35 minutes  SIGNED:  Chipper Oman, MD  [facility] 02/16/2017, 4:08 PM  Pager please text page via  [email or web address] Password [email or web address]

## 2017-02-19 ENCOUNTER — Other Ambulatory Visit: Payer: Self-pay | Admitting: *Deleted

## 2017-02-19 MED FILL — ?ATORVASTATIN 40MG TABLET: 40 | 30 days supply | Qty: 30 | Fill #0

## 2017-02-19 MED FILL — METHOCARBAMOL 750 MG TABLET: 750 | 5 days supply | Qty: 15 | Fill #0

## 2017-02-19 MED FILL — IBUPROFEN 600 MG TABLET: 600 | 4 days supply | Qty: 15 | Fill #0

## 2017-02-19 MED FILL — NICOTINE 21 MG/24HR PATCH: 21 | 28 days supply | Qty: 28 | Fill #0

## 2017-02-19 MED FILL — ?CLOPIDOGREL 75MG TAB: 75 | 30 days supply | Qty: 30 | Fill #0

## 2017-02-19 NOTE — Patient Outreach (Signed)
[place] [facility]) Care Management  02/19/2017  [name] 10-30-1950 [id number]  EMMI-Stroke RED ON EMMI ALERT DAY#: 1 DATE:02/17/17 RED ALERT: Scheduled a follow-up appointment? No Filled new prescriptions? No Been able to take every dose of meds? No  Outreach attempt to patient and HIPAA verified with patient. Patient confirmed not scheduling a follow-up discharge hospital appointment with his primary MD. Patient stated, his spouse plans to schedule his appointment today (02/19/17). Patient was given a three day supply of medications at the hospital prior to discharge, per EMR. Patient stated, his spouse will pick-up his new medications from the pharmacy today. Patient said, he is completely out of his medications. Patient reported, he has taken every dose of his prescribed medications, including the Plavix and Lipitor. Patient stated, he has been using the Nicotine patches. Patient discussed how he is making changes to improve his health. He is weaning himself from smoking since being in the hospital. Patient verbalized smoking since the age of [age]. Patient discussed changes in his diet. He stated, his spouse has him eating lots of fruits, limiting his salt intake, and increasing his fiber intake. Patient reported, he and his spouse received his hospital discharge paperwork and they understood it.   Patient was able to describe his symptoms of a stroke. He described numbness, tingling in his lower extremities, swelling in his feet, and decrease in his strength. Patient stated, he drove himself home, initially. He wanted to speak with his family about his symptoms. Per patient, "if anything is wrong with him; he plans to go home first and then go to the hospital, if needed". Patient was given Stroke education materials prior to discharge. Patient stated, he is reading the literature and educating himself about a Stroke.    Plan: RN CM will notify [facility] Case Management Assistant  regarding case closure.  RN CM will send patient EMMI educational materials and Stroke magnet.   Lake Bells, RN, BSN, MHA/MSL, [address] Telephonic Care Manager Coordinator [facility] Direct Phone: [phone] Toll Free: [phone] Fax: [phone]

## 2017-02-20 ENCOUNTER — Encounter: Payer: Self-pay | Admitting: *Deleted

## 2017-02-20 NOTE — Telephone Encounter (Signed)
This encounter was created in error - please disregard.

## 2017-03-02 ENCOUNTER — Inpatient Hospital Stay: Payer: Self-pay

## 2017-03-08 MED FILL — ?ATORVASTATIN 40MG TABLET: 40 | 30 days supply | Qty: 30 | Fill #0

## 2017-03-08 MED FILL — ?CLOPIDOGREL 75MG TAB: 75 | 30 days supply | Qty: 30 | Fill #0

## 2017-03-08 MED FILL — AMLODIPINE BESYLATE 5 MG TA: 5 | 30 days supply | Qty: 30 | Fill #0

## 2017-03-08 MED FILL — NITROSTAT 0.4 MG TABLET SL: 0.4 | 20 days supply | Qty: 25 | Fill #0

## 2017-04-16 MED FILL — AMLODIPINE BESYLATE 5 MG TA: 5 | 30 days supply | Qty: 30 | Fill #1

## 2017-04-16 MED FILL — ?ATORVASTATIN 40MG TABLET: 40 | 30 days supply | Qty: 30 | Fill #1

## 2017-04-16 MED FILL — ?CLOPIDOGREL 75MG TAB: 75 | 30 days supply | Qty: 30 | Fill #1

## 2017-04-17 ENCOUNTER — Ambulatory Visit: Payer: MEDICAID | Admitting: Neurology

## 2017-04-17 ENCOUNTER — Telehealth: Payer: Self-pay

## 2017-04-17 NOTE — Telephone Encounter (Signed)
Patient was a no show for appt today.

## 2017-04-18 ENCOUNTER — Encounter: Payer: Self-pay | Admitting: Neurology

## 2017-06-19 MED FILL — ?AMLODIPINE BESYLATE 5 MG T: 5 MG | 30 days supply | Qty: 30 | Fill #2

## 2017-06-19 MED FILL — ?CLOPIDOGREL 75MG TABLET: 75 | 30 days supply | Qty: 30 | Fill #2

## 2017-06-19 MED FILL — ?ATORVASTATIN 40MG TABLET: 40 | 30 days supply | Qty: 30 | Fill #2

## 2017-08-07 ENCOUNTER — Ambulatory Visit: Payer: MEDICAID | Admitting: Neurology

## 2017-08-07 ENCOUNTER — Encounter: Payer: Self-pay | Admitting: Neurology

## 2017-08-07 VITALS — BP 142/68 | HR 78 | Ht 72.0 in | Wt 149.6 lb

## 2017-08-07 DIAGNOSIS — I6381 Other cerebral infarction due to occlusion or stenosis of small artery: Secondary | ICD-10-CM

## 2017-08-07 NOTE — Progress Notes (Signed)
[facility] [address] [place]. [place] [address] [phone]       OFFICE FOLLOW-UP NOTE  [name] Date of Birth:  1950/11/07 Medical Record Number:  [id number]   HPI: [name] is a 67 year old African-American male seen today for first office follow-up visit following hospital admission for stroke in October 2018.History is obtained from the patient and review of electronic medical records. I have personally reviewed imaging films.[name] an 67 y.o.malepresened on 10/2/2018for evaluation of sudden onset weakness to R side and back pain. Pt reported he was at work when he suddenly felt numbness and weakness to R arm and leg. Pt reported weakness has improved but numbness still present. History of cocaine abuse but quit 14 years ago and last THC was 1 month and still smokes. Vehemently denies any current cocaine use. Date last known well: Date: 02/13/2017 Time last known well: Time: 08:30 tPA Given:No: minimal symptoms NIHSS 2Modified Rankin: Rankin Score=0. CT head was negative. CT angiogram showed only minor atheromatous changes in the neck in the brain. MRI scan showed tiny acute versus subacute pontine lacunar infarct. Carotid ultrasound was not done. Transthoracic echo showed normal ejection fraction.LDL cholesterol was 78 mg percent. Hemoglobin A1c was 5.8. Patient was started on Plavix for stroke prevention and counseled to quit smoking which he did. He made good recovery from his stroke but states that his back pain and knee pain is bothering him the most. He has more trouble when he gets up from the bed or from a chair. He plans to discuss this with his primary physician and get treatment. He states his blood pressure is well controlled today it is 142/68. He is tolerating Lipitor well without muscle aches or pains    ROS:   14 system review of systems is positive for back pain, knee pain and all other systems negative PMH:  Past Medical  History:  Diagnosis Date  . Angina   . Arthritis   . Chronic lower back pain   . Coronary artery disease   . DJD (degenerative joint disease)   . GERD (gastroesophageal reflux disease)   . High cholesterol   . Hypertension   . 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 MI (myocardial infarction) ([place]) 2011  . Peptic ulcer disease   . Pneumonia    "when I was younger"  . Sinus bradycardia 09/28/11  . Stroke ([place])   . SVT (supraventricular tachycardia) ([place])   . TIA (transient ischemic attack)   . Walking pneumonia ~ 2009    Social History:  Social History   Socioeconomic History  . Marital status: Single    Spouse name: Not on file  . Number of children: Not on file  . Years of education: Not on file  . Highest education level: Not on file  Occupational History  . Not on file  Social Needs  . Financial resource strain: Not on file  . Food insecurity:    Worry: Not on file    Inability: Not on file  . Transportation needs:    Medical: Not on file    Non-medical: Not on file  Tobacco Use  . Smoking status: Former Smoker    Packs/day: 0.50    Years: 52.00    Pack years: 26.00    Types: Cigarettes    Last attempt to quit: 02/26/2017    Years since quitting: 0.4  . Smokeless tobacco: Never Used  Substance and Sexual Activity  . Alcohol  use: Yes    Alcohol/week: 7.2 oz    Types: 12 Cans of beer per week  . Drug use: Yes    Types: Marijuana, Cocaine    Comment: ; cocaine ~ 2001", still smoke marijuana for pain and leisure  . Sexual activity: Yes  Lifestyle  . Physical activity:    Days per week: Not on file    Minutes per session: Not on file  . Stress: Not on file  Relationships  . Social connections:    Talks on phone: Not on file    Gets together: Not on file    Attends religious service: Not on file    Active member of club or organization: Not on file    Attends meetings of clubs or organizations: Not on file    Relationship status: Not on file  . Intimate partner  violence:    Fear of current or ex partner: Not on file    Emotionally abused: Not on file    Physically abused: Not on file    Forced sexual activity: Not on file  Other Topics Concern  . Not on file  Social History Narrative  . Not on file    Medications:   Current Outpatient Medications on File Prior to Visit  Medication Sig Dispense Refill  . amLODipine (NORVASC) 5 MG tablet Take 5 mg by mouth daily.    Marland Kitchen atorvastatin (LIPITOR) 40 MG tablet Take 1 tablet (40 mg total) by mouth daily. 30 tablet 0  . clopidogrel (PLAVIX) 75 MG tablet Take 1 tablet (75 mg total) by mouth daily. 30 tablet 0   No current facility-administered medications on file prior to visit.     Allergies:  No Known Allergies  Physical Exam General: well developed, well nourished middle aged African-American American male, seated, in no evident distress Head: head normocephalic and atraumatic.  Neck: supple with no carotid or supraclavicular bruits Cardiovascular: regular rate and rhythm, no murmurs Musculoskeletal: no deformity Skin:  no rash/petichiae Vascular:  Normal pulses all extremities Vitals:   08/07/17 1456  BP: (!) 142/68  Pulse: 78   Neurologic Exam Mental Status: Awake and fully alert. Oriented to place and time. Recent and remote memory intact. Attention span, concentration and fund of knowledge appropriate. Mood and affect appropriate.  Cranial Nerves: Fundoscopic exam reveals sharp disc margins. Pupils equal, briskly reactive to light. Extraocular movements full without nystagmus. Visual fields full to confrontation. Hearing intact. Facial sensation intact. Face, tongue, palate moves normally and symmetrically.  Motor: Normal bulk and tone. Normal strength in all tested extremity muscles. Sensory.: intact to touch ,pinprick .position and vibratory sensation.  Coordination: Rapid alternating movements normal in all extremities. Finger-to-nose and heel-to-shin performed accurately  bilaterally. Gait and Station: Arises from chair with  Difficulty due to back pain. Stance is normal. Gait demonstrates normal stride length and balance . Able to heel, toe and tandem walk without difficulty.  Reflexes: 1+ and symmetric. Toes downgoing.   NIHSS  0 Modified Rankin  1  ASSESSMENT: 67 year old male with pontine lacunar infarct in October 2018 from small vessel disease. Vascular risk factors of smoking, hypertension and hyperlipidemia    PLAN: I had a long d/w patient about his recent lacunar stroke, risk for recurrent stroke/TIAs, personally independently reviewed imaging studies and stroke evaluation results and answered questions.Continue Plavix  for secondary stroke prevention and maintain strict control of hypertension with blood pressure goal below 130/90, diabetes with hemoglobin A1c goal below 6.5% and lipids with LDL cholesterol  goal below 70 mg/dL. I also advised the patient to eat a healthy diet with plenty of whole grains, cereals, fruits and vegetables, exercise regularly and maintain ideal body weight.I complimented him on his smoking cessation. Followup in the future with my nurse practitioner in one year or call earlier if necessary Greater than 50% of time during this 25 minute visit was spent on counseling,explanation of diagnosis of lacunar infarct, planning of further management, discussion with patient and family and coordination of care Antony Contras, MD  [facility] [address] [place], [place] [address]  Phone [phone] Fax [phone] Note: This document was prepared with digital dictation and possible smart phrase technology. Any transcriptional errors that result from this process are unintentional

## 2017-08-07 NOTE — Patient Instructions (Signed)
I had a long d/w patient about his recent lacunar stroke, risk for recurrent stroke/TIAs, personally independently reviewed imaging studies and stroke evaluation results and answered questions.Continue Plavix  for secondary stroke prevention and maintain strict control of hypertension with blood pressure goal below 130/90, diabetes with hemoglobin A1c goal below 6.5% and lipids with LDL cholesterol goal below 70 mg/dL. I also advised the patient to eat a healthy diet with plenty of whole grains, cereals, fruits and vegetables, exercise regularly and maintain ideal body weight.I complimented him on his smoking cessation. Followup in the future with my nurse practitioner in one year or call earlier if necessary   Stroke Prevention Some medical conditions and behaviors are associated with a higher chance of having a stroke. You can help prevent a stroke by making nutrition, lifestyle, and other changes, including managing any medical conditions you may have. What nutrition changes can be made?  Eat healthy foods. You can do this by: ? Choosing foods high in fiber, such as fresh fruits and vegetables and whole grains. ? Eating at least 5 or more servings of fruits and vegetables a day. Try to fill half of your plate at each meal with fruits and vegetables. ? Choosing lean protein foods, such as lean cuts of meat, poultry without skin, fish, tofu, beans, and nuts. ? Eating low-fat dairy products. ? Avoiding foods that are high in salt (sodium). This can help lower blood pressure. ? Avoiding foods that have saturated fat, trans fat, and cholesterol. This can help prevent high cholesterol. ? Avoiding processed and premade foods.  Follow your health care provider's specific guidelines for losing weight, controlling high blood pressure (hypertension), lowering high cholesterol, and managing diabetes. These may include: ? Reducing your daily calorie intake. ? Limiting your daily sodium intake to 1,500  milligrams (mg). ? Using only healthy fats for cooking, such as olive oil, canola oil, or sunflower oil. ? Counting your daily carbohydrate intake. What lifestyle changes can be made?  Maintain a healthy weight. Talk to your health care provider about your ideal weight.  Get at least 30 minutes of moderate physical activity at least 5 days a week. Moderate activity includes brisk walking, biking, and swimming.  Do not use any products that contain nicotine or tobacco, such as cigarettes and e-cigarettes. If you need help quitting, ask your health care provider. It may also be helpful to avoid exposure to secondhand smoke.  Limit alcohol intake to no more than 1 drink a day for nonpregnant women and 2 drinks a day for men. One drink equals 12 oz of beer, 5 oz of wine, or 1 oz of hard liquor.  Stop any illegal drug use.  Avoid taking birth control pills. Talk to your health care provider about the risks of taking birth control pills if: ? You are over 21 years old. ? You smoke. ? You get migraines. ? You have ever had a blood clot. What other changes can be made?  Manage your cholesterol levels. ? Eating a healthy diet is important for preventing high cholesterol. If cholesterol cannot be managed through diet alone, you may also need to take medicines. ? Take any prescribed medicines to control your cholesterol as told by your health care provider.  Manage your diabetes. ? Eating a healthy diet and exercising regularly are important parts of managing your blood sugar. If your blood sugar cannot be managed through diet and exercise, you may need to take medicines. ? Take any prescribed medicines to control  your diabetes as told by your health care provider.  Control your hypertension. ? To reduce your risk of stroke, try to keep your blood pressure below 130/80. ? Eating a healthy diet and exercising regularly are an important part of controlling your blood pressure. If your blood  pressure cannot be managed through diet and exercise, you may need to take medicines. ? Take any prescribed medicines to control hypertension as told by your health care provider. ? Ask your health care provider if you should monitor your blood pressure at home. ? Have your blood pressure checked every year, even if your blood pressure is normal. Blood pressure increases with age and some medical conditions.  Get evaluated for sleep disorders (sleep apnea). Talk to your health care provider about getting a sleep evaluation if you snore a lot or have excessive sleepiness.  Take over-the-counter and prescription medicines only as told by your health care provider. Aspirin or blood thinners (antiplatelets or anticoagulants) may be recommended to reduce your risk of forming blood clots that can lead to stroke.  Make sure that any other medical conditions you have, such as atrial fibrillation or atherosclerosis, are managed. What are the warning signs of a stroke? The warning signs of a stroke can be easily remembered as BEFAST.  B is for balance. Signs include: ? Dizziness. ? Loss of balance or coordination. ? Sudden trouble walking.  E is for eyes. Signs include: ? A sudden change in vision. ? Trouble seeing.  F is for face. Signs include: ? Sudden weakness or numbness of the face. ? The face or eyelid drooping to one side.  A is for arms. Signs include: ? Sudden weakness or numbness of the arm, usually on one side of the body.  S is for speech. Signs include: ? Trouble speaking (aphasia). ? Trouble understanding.  T is for time. ? These symptoms may represent a serious problem that is an emergency. Do not wait to see if the symptoms will go away. Get medical help right away. Call your local emergency services (911 in the [place] Do not drive yourself to the hospital.  Other signs of stroke may include: ? A sudden, severe headache with no known cause. ? Nausea or  vomiting. ? Seizure.  Where to find more information: For more information, visit:  American Stroke Association: www.strokeassociation.org  National Stroke Association: www.stroke.org  Summary  You can prevent a stroke by eating healthy, exercising, not smoking, limiting alcohol intake, and managing any medical conditions you may have.  Do not use any products that contain nicotine or tobacco, such as cigarettes and e-cigarettes. If you need help quitting, ask your health care provider. It may also be helpful to avoid exposure to secondhand smoke.  Remember BEFAST for warning signs of stroke. Get help right away if you or a loved one has any of these signs. This information is not intended to replace advice given to you by your health care provider. Make sure you discuss any questions you have with your health care provider. Document Released: 06/08/2004 Document Revised: 06/06/2016 Document Reviewed: 06/06/2016 Elsevier Interactive Patient Education  [employer].

## 2017-12-21 DIAGNOSIS — I10 Essential (primary) hypertension: Secondary | ICD-10-CM | POA: Diagnosis not present

## 2017-12-21 DIAGNOSIS — I252 Old myocardial infarction: Secondary | ICD-10-CM | POA: Diagnosis not present

## 2017-12-21 DIAGNOSIS — E785 Hyperlipidemia, unspecified: Secondary | ICD-10-CM | POA: Diagnosis not present

## 2017-12-21 DIAGNOSIS — I25118 Atherosclerotic heart disease of native coronary artery with other forms of angina pectoris: Secondary | ICD-10-CM | POA: Diagnosis not present

## 2017-12-21 DIAGNOSIS — Z8673 Personal history of transient ischemic attack (TIA), and cerebral infarction without residual deficits: Secondary | ICD-10-CM | POA: Diagnosis not present

## 2018-03-14 IMAGING — CR DG CHEST 2V
3 series · 3 of 3 positions shown · non-contrast
Comparison: Chest x-ray dated 06/01/2013.

CLINICAL DATA: Pt c/o central cp x 3 days, reports prev hx
"blockages and stents" x 3.

EXAM:
CHEST  2 VIEW

[w chest lat]
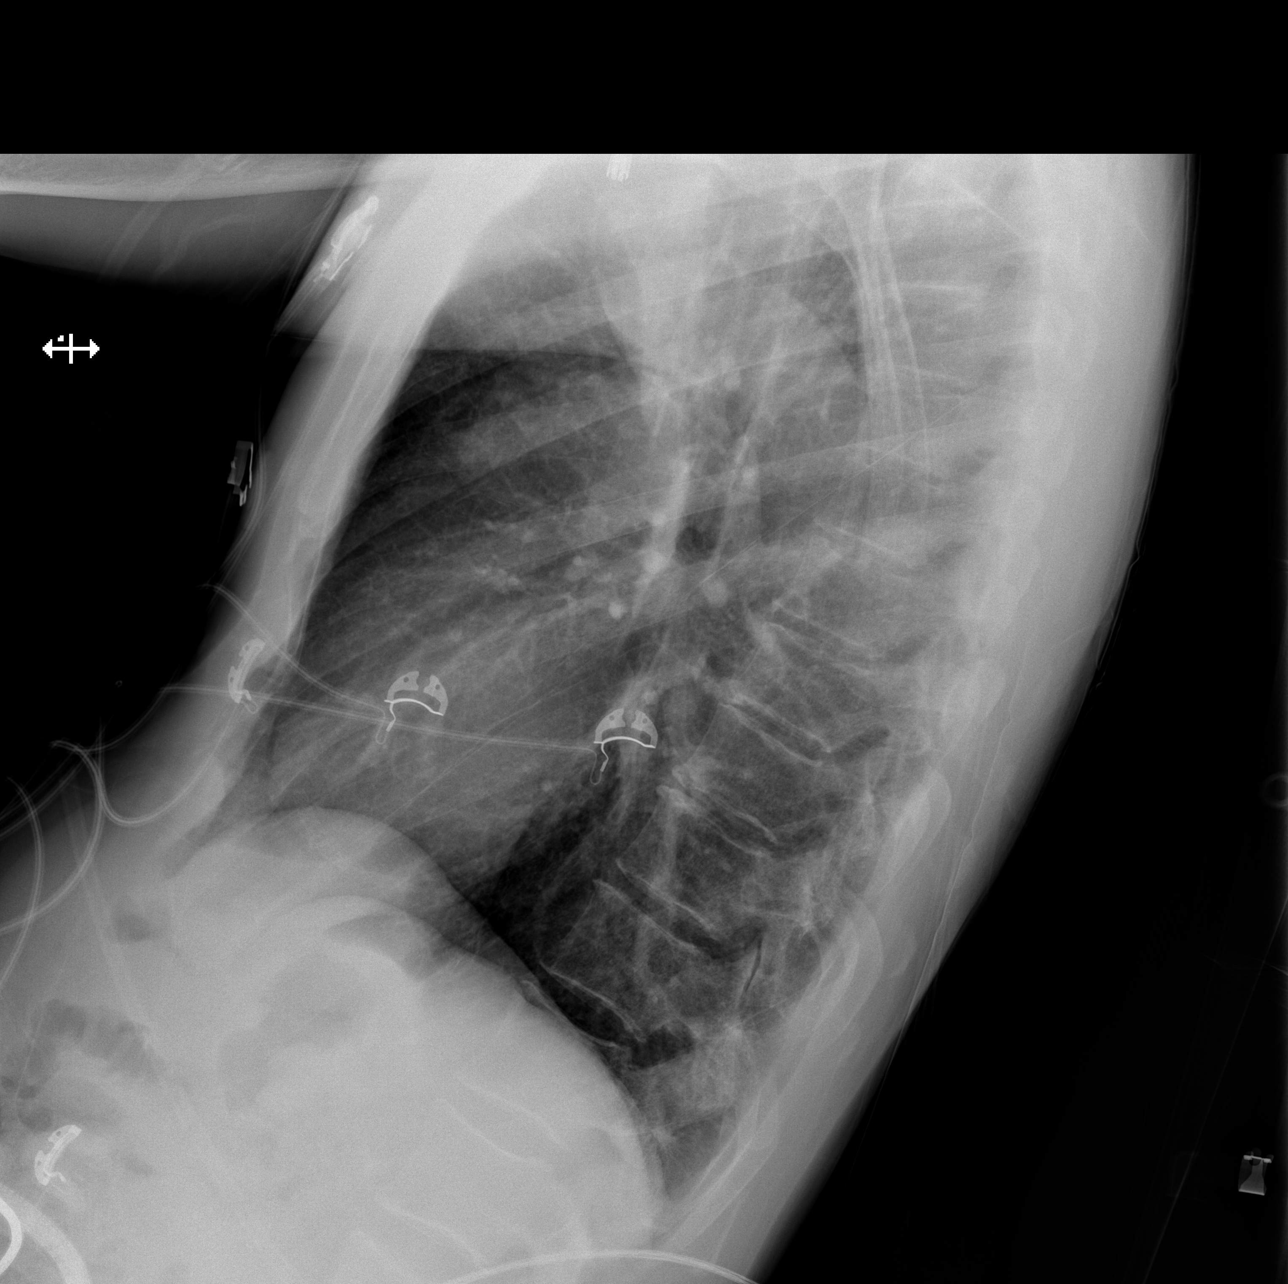

[x chest ap (1 of 2)]
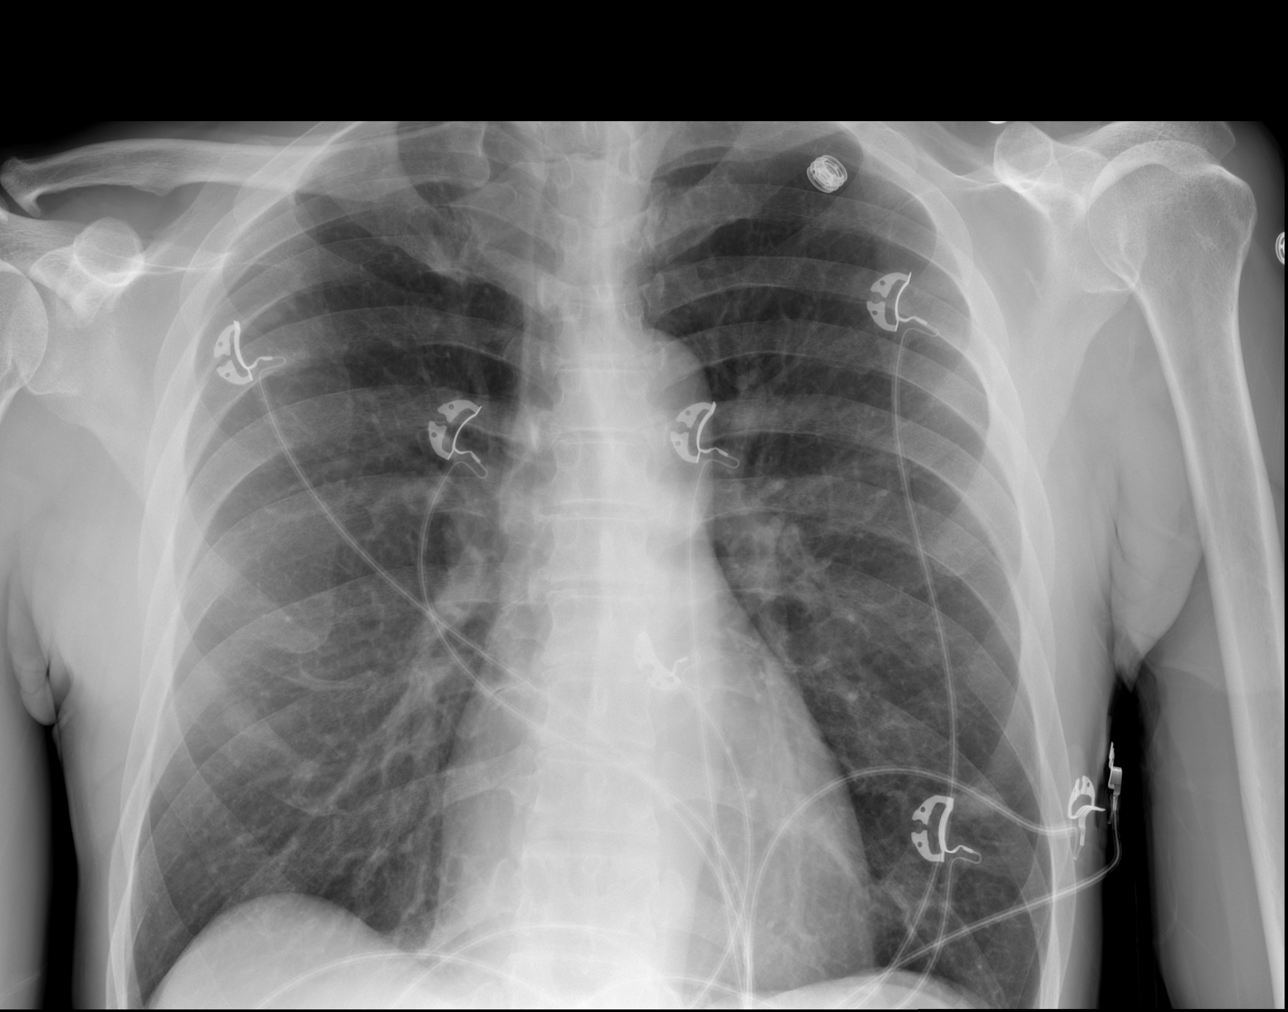

[x chest ap (2 of 2)]
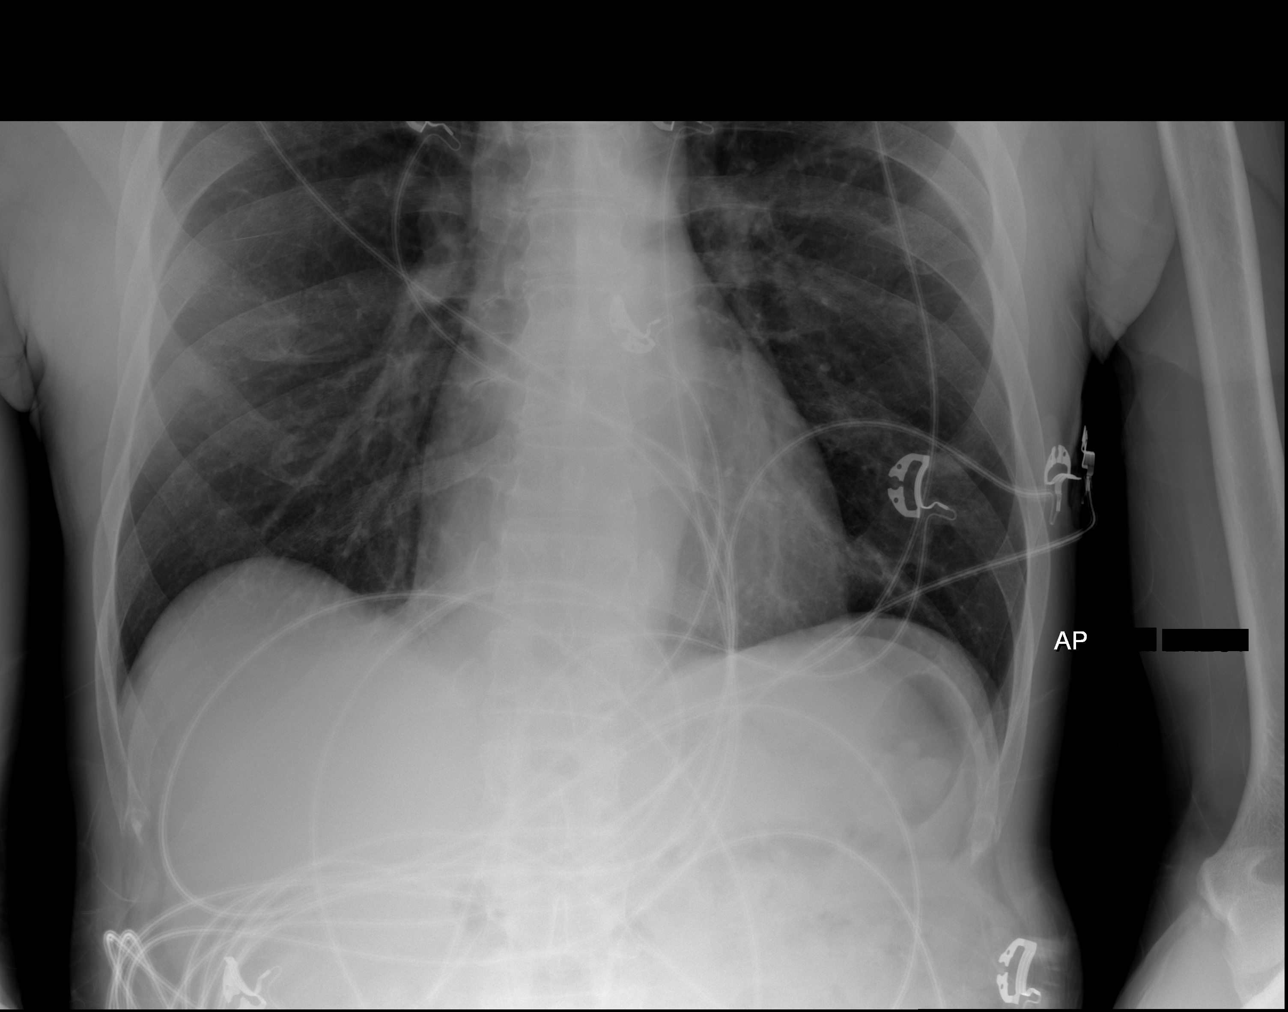

[3 of 3 positions shown; findings below may reference images not displayed]

FINDINGS: The heart size and mediastinal contours are within normal limits.
Both lungs are clear. The visualized skeletal structures are
unremarkable.
IMPRESSION: No active cardiopulmonary disease. No evidence of pneumonia or
pulmonary edema.

## 2018-06-07 DIAGNOSIS — E785 Hyperlipidemia, unspecified: Secondary | ICD-10-CM | POA: Diagnosis not present

## 2018-06-07 DIAGNOSIS — I25118 Atherosclerotic heart disease of native coronary artery with other forms of angina pectoris: Secondary | ICD-10-CM | POA: Diagnosis not present

## 2018-06-07 DIAGNOSIS — I10 Essential (primary) hypertension: Secondary | ICD-10-CM | POA: Diagnosis not present

## 2018-06-07 DIAGNOSIS — Z8673 Personal history of transient ischemic attack (TIA), and cerebral infarction without residual deficits: Secondary | ICD-10-CM | POA: Diagnosis not present

## 2018-06-07 DIAGNOSIS — I252 Old myocardial infarction: Secondary | ICD-10-CM | POA: Diagnosis not present

## 2018-08-12 ENCOUNTER — Ambulatory Visit: Payer: Self-pay | Admitting: Adult Health

## 2018-11-08 ENCOUNTER — Encounter: Payer: Self-pay | Admitting: Gastroenterology

## 2018-11-08 ENCOUNTER — Telehealth: Payer: Self-pay | Admitting: Emergency Medicine

## 2018-11-08 ENCOUNTER — Ambulatory Visit (INDEPENDENT_AMBULATORY_CARE_PROVIDER_SITE_OTHER): Payer: Medicare Other | Admitting: Gastroenterology

## 2018-11-08 VITALS — Ht 72.0 in | Wt 145.0 lb

## 2018-11-08 DIAGNOSIS — Z86010 Personal history of colonic polyps: Secondary | ICD-10-CM | POA: Diagnosis not present

## 2018-11-08 DIAGNOSIS — Z7902 Long term (current) use of antithrombotics/antiplatelets: Secondary | ICD-10-CM | POA: Diagnosis not present

## 2018-11-08 MED ORDER — NA SULFATE-K SULFATE-MG SULF 17.5-3.13-1.6 GM/177ML PO SOLN: 1.0000 | ORAL | 0 refills | Status: DC

## 2018-11-08 NOTE — Telephone Encounter (Signed)
   [name] Jul 02, 1950 [id number]  Dear Dr. Terrence Dupont:  We have scheduled the above named patient for a(n) colonoscopy procedure. Our records show that (s)he is on anticoagulation therapy.  Please advise as to whether the patient may come off their therapy of Plavix  5 days prior to their procedure which is scheduled for 11-18-2018.  Please route your response to Tinnie Gens, CMA or fax response to [phone].  Sincerely,    [facility]

## 2018-11-08 NOTE — Progress Notes (Signed)
TELEHEALTH VISIT  Referring Provider: Charolette Forward, MD Primary Care Physician:  Charolette Forward, MD   Tele-visit due to COVID-19 pandemic Patient requested visit virtually, consented to the virtual encounter via audio enabled telemedicine application (Doximity).  The patient does not have a smart phone or computer to allow for video enabled telemedicine application use. Contact made at: 10:00 11/08/18 Patient verified by name and date of birth Location of patient: Home Location provider:  medical office Names of persons participating: Me, patient, Estill Bakes CMA Time spent on telehealth visit: 25 minutes I discussed the limitations of evaluation and management by telemedicine. The patient expressed understanding and agreed to proceed.  Reason for Consultation:  History of polyps   IMPRESSION:  Personal history of colon polyp    - tubular adenoma removed 07/07/09 [name]) Plavix use (managed by Dr. Terrence Dupont) Family history of colon cancer (father 79s, brother 30, cousin in his 48s)  Surveillance colonoscopy recommended. Will need to hold Plavix for 5 days before endoscopy.  I discussed with the patient that there is a low, but real, risk of a cardiovascular event such as heart attack, stroke, or embolism/thrombosis while off Plavix. Will communicate by phone or EMR with patient's prescribing provider to confirm that holding the Plavix is appropriate at this time.   [name] has a strong family history of colon cancer.  I have recommended genetic counseling to guide appropriate surveillance in [name] as well as his 14 children.  He declined referral for genetic counseling at this time.  PLAN: Colonoscopy after a Plavix washout He declined a referral for genetic counseling  I consented the patient discussing the risks, benefits, and alternatives to endoscopic evaluation. In particular, we discussed the risks that include, but are not limited to, reaction to  medication, cardiopulmonary compromise, bleeding requiring blood transfusion, aspiration resulting in pneumonia, perforation requiring surgery, lack of diagnosis, severe illness requiring hospitalization, and even death. We reviewed the risk of missed lesion including polyps or even cancer. The patient acknowledges these risks and asks that we proceed.   HPI: [name] is a 68 y.o. male due for surveillance colonoscopy. The history is obtained through the patient review of his electronic health record.  He had a CVA 02/2017, chronic low back pain, CAD s/p MI 2011, history of bradycardia. He is on Plavix.  He last held Plavix in preparation for his colonoscopy in 2011.  He uses marijuana regularly.   Review of his electronic health record shows a screening colonoscopy with Dr. Deatra [name] 07/07/09: 76mm ascending colon tubular adenoma. Surveillance recommended in 5 years.   [name] denies any GI symptoms.  In particular, there is no dysphagia, odynophagia, regurgitation, heartburn, nausea, abdominal pain, change in bowel habits, melena, hematochezia, or bright red blood per rectum. There is no anorexia or recent change in weight.   Father had colon cancer in his 7s, brother with colon cancer at age 90, cousin with colon cancer at age 87. He has 14 children. No other known family history of colon cancer or polyps. No family history of uterine/endometrial cancer, pancreatic cancer or gastric/stomach cancer.  Past Medical History:  Diagnosis Date  . Angina   . Arthritis   . Chronic lower back pain   . Coronary artery disease   . DJD (degenerative joint disease)   . GERD (gastroesophageal reflux disease)   . High cholesterol   . Hypertension   . 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 MI (myocardial infarction) ([place]) 2011  .  Peptic ulcer disease   . Pneumonia    "when I was younger"  . Sinus bradycardia 09/28/11  . Stroke ([place])   . SVT (supraventricular tachycardia) ([place])   . TIA (transient ischemic attack)    . Walking pneumonia ~ 2009    Past Surgical History:  Procedure Laterality Date  . CARDIAC CATHETERIZATION    . COLECTOMY  1990's   "ulcer burst; took out part of my colon"  . CORONARY ANGIOPLASTY WITH STENT PLACEMENT  08/2006   "1" cypher drug eluting stent ok for up to 3T   . LEFT HEART CATHETERIZATION WITH CORONARY ANGIOGRAM N/A 09/29/2011   Procedure: LEFT HEART CATHETERIZATION WITH CORONARY ANGIOGRAM;  Surgeon: [name], MD;  Location: [facility]ATH [facility];  Service: Cardiovascular;  Laterality: N/A;    Current Outpatient Medications  Medication Sig Dispense Refill  . amLODipine (NORVASC) 5 MG tablet Take 5 mg by mouth daily.    Marland Kitchen atorvastatin (LIPITOR) 40 MG tablet Take 1 tablet (40 mg total) by mouth daily. 30 tablet 0  . clopidogrel (PLAVIX) 75 MG tablet Take 1 tablet (75 mg total) by mouth daily. 30 tablet 0   No current facility-administered medications for this visit.     Allergies as of 11/08/2018  . (No Known Allergies)    No family history on file.  Social History   Socioeconomic History  . Marital status: Single    Spouse name: Not on file  . Number of children: Not on file  . Years of education: Not on file  . Highest education level: Not on file  Occupational History  . Not on file  Social Needs  . Financial resource strain: Not on file  . Food insecurity    Worry: Not on file    Inability: Not on file  . Transportation needs    Medical: Not on file    Non-medical: Not on file  Tobacco Use  . Smoking status: Former Smoker    Packs/day: 0.50    Years: 52.00    Pack years: 26.00    Types: Cigarettes    Quit date: 02/26/2017    Years since quitting: 1.6  . Smokeless tobacco: Never Used  Substance and Sexual Activity  . Alcohol use: Yes    Alcohol/week: 12.0 standard drinks    Types: 12 Cans of beer per week  . Drug use: Yes    Types: Marijuana, Cocaine    Comment: ; cocaine ~ 2001", still smoke marijuana for pain and leisure  . Sexual  activity: Yes  Lifestyle  . Physical activity    Days per week: Not on file    Minutes per session: Not on file  . Stress: Not on file  Relationships  . Social Herbalist on phone: Not on file    Gets together: Not on file    Attends religious service: Not on file    Active member of club or organization: Not on file    Attends meetings of clubs or organizations: Not on file    Relationship status: Not on file  . Intimate partner violence    Fear of current or ex partner: Not on file    Emotionally abused: Not on file    Physically abused: Not on file    Forced sexual activity: Not on file  Other Topics Concern  . Not on file  Social History Narrative  . Not on file    Review of Systems: ALL ROS discussed and all  others negative except listed in HPI.  Physical Exam: Complete physical exam not performed due to the limits inherent in a telehealth encounter.  General: Awake, alert, and oriented, and well communicative. In no acute distress.  Pulm: No labored breathing, speaking in full sentences without conversational dyspnea  Psych: Pleasant, cooperative, normal speech, normal affect and normal insight Neuro: Alert and appropriate   Marchele Decock L. Tarri Glenn, MD, MPH [facility] 11/08/2018, 8:30 AM

## 2018-11-11 DIAGNOSIS — E785 Hyperlipidemia, unspecified: Secondary | ICD-10-CM | POA: Diagnosis not present

## 2018-11-11 DIAGNOSIS — I252 Old myocardial infarction: Secondary | ICD-10-CM | POA: Diagnosis not present

## 2018-11-11 DIAGNOSIS — I10 Essential (primary) hypertension: Secondary | ICD-10-CM | POA: Diagnosis not present

## 2018-11-11 DIAGNOSIS — I25118 Atherosclerotic heart disease of native coronary artery with other forms of angina pectoris: Secondary | ICD-10-CM | POA: Diagnosis not present

## 2018-11-14 ENCOUNTER — Telehealth: Payer: Self-pay | Admitting: Gastroenterology

## 2018-11-14 ENCOUNTER — Telehealth: Payer: Self-pay

## 2018-11-14 NOTE — Telephone Encounter (Signed)
Patient was scheduled for a colonoscopy on 11/18/18. Jane from the [facility] called down stating that she did not see an anticoag letter from Dr Zenia Resides office indication how long to stop his Plavix. I called and spoke to Dr Terrence Dupont who gave a verbal order that patient needs to be off Plavix for 7 days. Colonoscopy for Monday has been cancelled. I called patient a few times to inform him of the cancellation but he does not have a voicemail set up to leave a message on. Patient will need to be rescheduled and hold Plavix for 7 days.

## 2018-11-14 NOTE — Telephone Encounter (Signed)
Spoke with patient regarding Covid-19 screening questions °Covid-19 Screening Questions ° °Do you now or have you had a fever in the last 14 days?   no   ° °Do you have any respiratory symptoms of shortness of breath or cough now or in the last 14 days?  no   ° °Do you have any family members or close contacts with diagnosed or suspected Covid-19 in the past 14 days?  no ° °Have you been tested for Covid-19 and found to be positive?  no  ° ° °

## 2018-11-18 ENCOUNTER — Other Ambulatory Visit: Payer: Self-pay

## 2018-11-18 ENCOUNTER — Encounter: Payer: Self-pay | Admitting: Gastroenterology

## 2018-11-18 ENCOUNTER — Encounter: Payer: Medicare Other | Admitting: Gastroenterology

## 2018-11-18 ENCOUNTER — Ambulatory Visit (AMBULATORY_SURGERY_CENTER): Payer: Medicare Other | Admitting: Gastroenterology

## 2018-11-18 VITALS — BP 172/77 | HR 47 | Temp 98.4°F | Resp 17 | Ht 72.0 in | Wt 145.0 lb

## 2018-11-18 DIAGNOSIS — Z86010 Personal history of colonic polyps: Secondary | ICD-10-CM | POA: Diagnosis not present

## 2018-11-18 DIAGNOSIS — K621 Rectal polyp: Secondary | ICD-10-CM | POA: Diagnosis not present

## 2018-11-18 DIAGNOSIS — Z800 Family history of malignant neoplasm of digestive organs: Secondary | ICD-10-CM

## 2018-11-18 DIAGNOSIS — D122 Benign neoplasm of ascending colon: Secondary | ICD-10-CM | POA: Diagnosis not present

## 2018-11-18 DIAGNOSIS — D123 Benign neoplasm of transverse colon: Secondary | ICD-10-CM

## 2018-11-18 DIAGNOSIS — D125 Benign neoplasm of sigmoid colon: Secondary | ICD-10-CM | POA: Diagnosis not present

## 2018-11-18 DIAGNOSIS — D120 Benign neoplasm of cecum: Secondary | ICD-10-CM

## 2018-11-18 MED ORDER — SODIUM CHLORIDE 0.9 % IV SOLN: 500.0000 mL | Freq: Once | INTRAVENOUS | Status: DC

## 2018-11-18 NOTE — Progress Notes (Signed)
Covid screening and temp done by CW. Vital signs done by Judy Branson. 

## 2018-11-18 NOTE — Progress Notes (Signed)
Report to PACU, RN, vss, BBS= Clear.  

## 2018-11-18 NOTE — Telephone Encounter (Signed)
Spoke with patient he states he had an appointment with Dr. Terrence Dupont and was told to hold his Plavix and has been holding it. He also states he did prep and is ready to come in for his appointment today. Notified admitting and put him back on the schedule for today.

## 2018-11-18 NOTE — Progress Notes (Signed)
Called to room to assist during endoscopic procedure.  Patient ID and intended procedure confirmed with present staff. Received instructions for my participation in the procedure from the performing physician.  

## 2018-11-18 NOTE — Patient Instructions (Signed)
Discharge instructions given. Handout on polyuYOU HAD AN ENDOSCOPIC PROCEDURE TODAY AT [facility]:   Refer to the procedure report that was given to you for any specific questions about what was found during the examination.  If the procedure report does not answer your questions, please call your gastroenterologist to clarify.  If you requested that your care partner not be given the details of your procedure findings, then the procedure report has been included in a sealed envelope for you to review at your convenience later.  YOU SHOULD EXPECT: Some feelings of bloating in the abdomen. Passage of more gas than usual.  Walking can help get rid of the air that was put into your GI tract during the procedure and reduce the bloating. If you had a lower endoscopy (such as a colonoscopy or flexible sigmoidoscopy) you may notice spotting of blood in your stool or on the toilet paper. If you underwent a bowel prep for your procedure, you may not have a normal bowel movement for a few days.  Please Note:  You might notice some irritation and congestion in your nose or some drainage.  This is from the oxygen used during your procedure.  There is no need for concern and it should clear up in a day or so.  SYMPTOMS TO REPORT IMMEDIATELY:   Following lower endoscopy (colonoscopy or flexible sigmoidoscopy):  Excessive amounts of blood in the stool  Significant tenderness or worsening of abdominal pains  Swelling of the abdomen that is new, acute  Fever of 100F or higher   For urgent or emergent issues, a gastroenterologist can be reached at any hour by calling [phone].   DIET:  We do recommend a small meal at first, but then you may proceed to your regular diet.  Drink plenty of fluids but you should avoid alcoholic beverages for 24 hours.  ACTIVITY:  You should plan to take it easy for the rest of today and you should NOT DRIVE or use heavy machinery until tomorrow (because of  the sedation medicines used during the test).    FOLLOW UP: Our staff will call the number listed on your records 48-72 hours following your procedure to check on you and address any questions or concerns that you may have regarding the information given to you following your procedure. If we do not reach you, we will leave a message.  We will attempt to reach you two times.  During this call, we will ask if you have developed any symptoms of COVID 19. If you develop any symptoms (ie: fever, flu-like symptoms, shortness of breath, cough etc.) before then, please call [phone].  If you test positive for Covid 19 in the 2 weeks post procedure, please call and report this information to [place].    If any biopsies were taken you will be contacted by phone or by letter within the next 1-3 weeks.  Please call us at [phone] if you have not heard about the biopsies in 3 weeks.    SIGNATURES/CONFIDENTIALITY: You and/or your care partner have signed paperwork which will be entered into your electronic medical record.  These signatures attest to the fact that that the information above on your After Visit Summary has been reviewed and is understood.  Full responsibility of the confidentiality of this discharge information lies with you and/or your care-partner.ps. Resume previous medications.

## 2018-11-18 NOTE — Op Note (Signed)
[facility] Patient Name: [name] Procedure Date: 11/18/2018 10:09 AM MRN: [id number] Endoscopist: Thornton Park MD, MD Age: 68 Referring MD:  Date of Birth: March 09, 1951 Gender: Male Account #: [id number] Procedure:                Colonoscopy Indications:              Surveillance: Personal history of adenomatous                            polyps on last colonoscopy > 5 years ago                           Personal history of colon polyp                           - tubular adenoma removed 07/07/09 [name])                           Plavix use (managed by Dr. Terrence Dupont)                           Family history of colon cancer (father 93s, brother                            79, cousin in his 80s) Medicines:                See the Anesthesia note for documentation of the                            administered medications Procedure:                Pre-Anesthesia Assessment:                           - Prior to the procedure, a History and Physical                            was performed, and patient medications and                            allergies were reviewed. The patient's tolerance of                            previous anesthesia was also reviewed. The risks                            and benefits of the procedure and the sedation                            options and risks were discussed with the patient.                            All questions were answered, and informed consent  was obtained. Prior Anticoagulants: The patient                            last took Plavix (clopidogrel) 10 days prior to the                            procedure. ASA Grade Assessment: III - A patient                            with severe systemic disease. After reviewing the                            risks and benefits, the patient was deemed in                            satisfactory condition to undergo the procedure.                           After  obtaining informed consent, the colonoscope                            was passed under direct vision. Throughout the                            procedure, the patient's blood pressure, pulse, and                            oxygen saturations were monitored continuously. The                            Colonoscope was introduced through the anus and                            advanced to the the terminal ileum, with                            identification of the appendiceal orifice and IC                            valve. A second forward view of the right colon was                            performed. The colonoscopy was performed without                            difficulty. The patient tolerated the procedure                            well. The quality of the bowel preparation was                            good. The terminal ileum, ileocecal valve,  appendiceal orifice, and rectum were photographed. Scope In: 10:18:25 AM Scope Out: 10:32:32 AM Scope Withdrawal Time: 0 hours 11 minutes 19 seconds  Total Procedure Duration: 0 hours 14 minutes 7 seconds  Findings:                 The perianal and digital rectal examinations were                            normal.                           Three flat polyps were found in the sigmoid colon,                            ascending colon and ileocecal valve. The polyps                            were 2 to 5 mm in size. These polyps were removed                            with a cold snare. Resection and retrieval were                            complete. Estimated blood loss was minimal.                           A 2 mm polyp was found in the splenic flexure. The                            polyp was sessile. The polyp was removed with a                            cold biopsy forceps. Resection and retrieval were                            complete. Estimated blood loss was minimal.                            Multiple hyperplastic polyps were present in the                            rectum. These were not removed. The exam was                            otherwise without abnormality on direct and                            retroflexion views. Complications:            No immediate complications. Estimated blood loss:                            Minimal. Estimated Blood Loss:     Estimated blood loss was minimal. Impression:               -  Three 2 to 5 mm polyps in the sigmoid colon, in                            the ascending colon and at the ileocecal valve,                            removed with a cold snare. Resected and retrieved.                           - One 2 mm polyp at the splenic flexure, removed                            with a cold biopsy forceps. Resected and retrieved.                           - The examination was otherwise normal on direct                            and retroflexion views. Recommendation:           - Patient has a contact number available for                            emergencies. The signs and symptoms of potential                            delayed complications were discussed with the                            patient. Return to normal activities tomorrow.                            Written discharge instructions were provided to the                            patient.                           - Resume regular diet today.                           - Continue present medications. May resume Plavix                            today.                           - Await pathology results.                           - Repeat colonoscopy in 3 years for surveillance if                            at least 3 polyps are adenomatous. Otherwise, plan  surveillance colonoscopy in 7 years. Thornton Park MD, MD 11/18/2018 10:45:00 AM This report has been signed electronically.

## 2018-11-20 ENCOUNTER — Telehealth: Payer: Self-pay

## 2018-11-20 NOTE — Telephone Encounter (Signed)
  Follow up Call-  Call back number 11/18/2018  Post procedure Call Back phone  # [phone]  Permission to leave phone message Yes  Some recent data might be hidden     Patient questions:  Do you have a fever, pain , or abdominal swelling? No. Pain Score  0 *  Have you tolerated food without any problems? Yes.    Have you been able to return to your normal activities? Yes.    Do you have any questions about your discharge instructions: Diet   No. Medications  No. Follow up visit  No.  Do you have questions or concerns about your Care? No.  Actions: * If pain score is 4 or above: No action needed, pain <4.  1. Have you developed a fever since your procedure? no  2.   Have you had an respiratory symptoms (SOB or cough) since your procedure? no  3.   Have you tested positive for COVID 19 since your procedure no  4.   Have you had any family members/close contacts diagnosed with the COVID 19 since your procedure?  no   If yes to any of these questions please route to Joylene John, RN and Alphonsa Gin, Therapist, sports.

## 2018-11-21 ENCOUNTER — Encounter: Payer: Self-pay | Admitting: Gastroenterology

## 2018-11-25 ENCOUNTER — Telehealth: Payer: Self-pay | Admitting: Neurology

## 2018-11-25 ENCOUNTER — Encounter: Payer: Self-pay | Admitting: Neurology

## 2018-11-25 ENCOUNTER — Ambulatory Visit (INDEPENDENT_AMBULATORY_CARE_PROVIDER_SITE_OTHER): Payer: Medicare Other | Admitting: Neurology

## 2018-11-25 ENCOUNTER — Other Ambulatory Visit: Payer: Self-pay

## 2018-11-25 VITALS — BP 145/78 | HR 48 | Temp 97.5°F | Wt 148.6 lb

## 2018-11-25 DIAGNOSIS — M5416 Radiculopathy, lumbar region: Secondary | ICD-10-CM

## 2018-11-25 MED ORDER — METHYLPREDNISOLONE 4 MG PO TABS: ORAL_TABLET | ORAL | 0 refills | Status: AC

## 2018-11-25 MED ORDER — GABAPENTIN 100 MG PO CAPS: 100.0000 mg | ORAL_CAPSULE | Freq: Three times a day (TID) | ORAL | 2 refills | Status: DC

## 2018-11-25 NOTE — Progress Notes (Signed)
[facility] [address] [place] [place] [address] [phone]       OFFICE FOLLOW-UP NOTE  [name] Date of Birth:  [birth year] Medical Record Number:  [id number]   PRF:FMBWGY visit 08/10/2017 ;  [name] is a 68 year old African-American male seen today for first office follow-up visit following hospital admission for stroke in October 2018.History is obtained from the patient and review of electronic medical records. I have personally reviewed imaging films.[name] an 68 y.o.malepresened on 10/2/2018for evaluation of sudden onset weakness to R side and back pain. Pt reported he was at work when he suddenly felt numbness and weakness to R arm and leg. Pt reported weakness has improved but numbness still present. History of cocaine abuse but quit 14 years ago and last THC was 1 month and still smokes. Vehemently denies any current cocaine use. Date last known well: Date: 02/13/2017 Time last known well: Time: 08:30 tPA Given:No: minimal symptoms NIHSS 2Modified Rankin: Rankin Score=0. CT head was negative. CT angiogram showed only minor atheromatous changes in the neck in the brain. MRI scan showed tiny acute versus subacute pontine lacunar infarct. Carotid ultrasound was not done. Transthoracic echo showed normal ejection fraction.LDL cholesterol was 78 mg percent. Hemoglobin A1c was 5.8. Patient was started on Plavix for stroke prevention and counseled to quit smoking which he did. He made good recovery from his stroke but states that his back pain and knee pain is bothering him the most. He has more trouble when he gets up from the bed or from a chair. He plans to discuss this with his primary physician and get treatment. He states his blood pressure is well controlled today it is 142/68. He is tolerating Lipitor well without muscle aches or pains  Update 11/25/2018 ; he returns for follow-up after last visit more than a year ago.  He has new complaint  of back pain and right leg radicular pain as well as back spasms.  He states is been subacute going on for several weeks.  He describes the pain as muscle tightness in the back with shooting pain around the lateral aspect of right buttock and thigh going down the knee up to the lateral aspect of the foot.  He had previous history of back pain and review of electronic medical records show that he had MRI scan done on 09/11/2015 which had shown degenerative disc disease with focal disc bulges at L4-5 and L5-S1 with facet arthropathy.  Patient states he is not had any further recurrent stroke or TIA symptoms.  He is tolerating Plavix well without bruising or bleeding.  He states his blood pressure is well controlled with Cozaar and Norvasc and today it is 145/78.  He is also tolerating Lipitor well without muscle aches and pains.  He had lipid profile checked recently with his primary care physician last Monday and it was satisfactory.  He has not been smoking and states he is eating healthy and has not gained any weight.  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   MI (myocardial  infarction) ([facility]) 2011   Peptic ulcer disease    Pneumonia    "when I was younger"   Sinus bradycardia 09/28/11   Stroke [facility])    SVT (supraventricular tachycardia) ([facility])    TIA (transient ischemic attack)    Walking pneumonia ~ 2009    Social History:  Social History   Socioeconomic History   Marital status: Single    Spouse name: Not on file   Number of children: 52   Years of education: Not on file   Highest education level: Not on file  Occupational History   Not on  file  Social Needs   Financial resource strain: Not on file   Food insecurity    Worry: Not on file    Inability: Not on file   Transportation needs    Medical: Not on file    Non-medical: Not on file  Tobacco Use   Smoking status: Former Smoker    Packs/day: 0.50    Years: 52.00    Pack years: 26.00    Types: Cigarettes    Quit date: 02/26/2017    Years since quitting: 1.7   Smokeless tobacco: Never Used  Substance and Sexual Activity   Alcohol use: Yes    Alcohol/week: 12.0 standard drinks    Types: 12 Cans of beer per week   Drug use: Yes    Types: Marijuana, Cocaine    Comment: ; cocaine ~ 2001", still smoke marijuana for pain and leisure   Sexual activity: Yes  Lifestyle   Physical activity    Days per week: Not on file    Minutes per session: Not on file   Stress: Not on file  Relationships   Social connections    Talks on phone: Not on file    Gets together: Not on file    Attends religious service: Not on file    Active member of club or organization: Not on file    Attends meetings of clubs or organizations: Not on file    Relationship status: Not on file   Intimate partner violence    Fear of current or ex partner: Not on file    Emotionally abused: Not on file    Physically abused: Not on file    Forced sexual activity: Not on file  Other Topics Concern   Not on file  Social History Narrative   Not on file    Medications:   Current Outpatient Medications on File Prior to Visit  Medication Sig Dispense Refill   amLODipine (NORVASC) 5 MG tablet Take 5 mg by mouth daily.     atorvastatin (LIPITOR) 40 MG tablet Take 1 tablet (40 mg total) by mouth daily. 30 tablet 0   clopidogrel (PLAVIX) 75 MG tablet Take 1 tablet (75 mg total) by mouth daily. 30 tablet 0   losartan (COZAAR) 25 MG tablet      No current facility-administered medications on file prior to visit.     Allergies:  No Known Allergies  Physical Exam General: Frail  middle aged African-American American male, seated, in no evident distress Head: head normocephalic and atraumatic.  Neck: supple with no carotid or supraclavicular bruits Cardiovascular: regular rate and rhythm, no murmurs Musculoskeletal: no deformity.  Spasm of the paraspinal muscles in the lumbosacral region with some tenderness on the right side.  Straight leg raising test is limited to 60 degrees on the right Skin:  no rash/petichiae Vascular:  Normal pulses all extremities Vitals:   11/25/18 [id number]  BP: (!) 145/78  Pulse: (!) 48  Temp: (!) 97.5 F (36.4 C)   Neurologic Exam Mental Status: Awake and fully alert. Oriented to place and time. Recent and remote memory intact. Attention span, concentration and fund of knowledge appropriate. Mood and affect appropriate.  Cranial Nerves: Fundoscopic exam reveals sharp disc margins. Pupils equal, briskly reactive to light. Extraocular movements full without nystagmus. Visual fields full to confrontation. Hearing intact. Facial sensation intact. Face, tongue, palate moves normally and symmetrically.  Motor: Normal bulk and tone. Normal strength in all tested extremity muscles. Sensory.: intact to touch ,pinprick .position and vibratory sensation.  Coordination: Rapid alternating movements normal in all extremities. Finger-to-nose and heel-to-shin performed accurately bilaterally. Gait and Station: Arises from chair with  Difficulty due to back pain. Stance is normal. Gait demonstrates normal stride length and balance . Able to heel, toe and tandem walk without difficulty.  Reflexes: 1+ and symmetric. Toes downgoing.    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 Vascular risk factors of smoking, hypertension and hyperlipidemia.  He is stable from neurovascular standpoint.  New complaints of back pain, right leg radicular pain and muscle spasms likely from right L5-S1 radiculopathy from  degenerative lumbar spine disease.    PLAN: I had a long d/w patient about his remote lacunar stroke, new back pain and lumbar radiculopathy symptoms,risk for recurrent stroke/TIAs, personally independently reviewed imaging studies and stroke evaluation results and answered questions.Continue Plavix  for secondary stroke prevention and maintain strict control of hypertension with blood pressure goal below 130/90, diabetes with hemoglobin A1c goal below 6.5% and lipids with LDL cholesterol goal below 70 mg/dL. I also advised the patient to eat a healthy diet with plenty of whole grains, cereals, fruits and vegetables, exercise regularly and maintain ideal body weight.I complimented him on his smoking cessation.  I recommend he do regular back stretching exercises and try Medrol Dosepak for his lumbar radiculopathy as well as gabapentin 100 mg twice daily for a week to be increased to 3 times daily as tolerated for his radicular pain.  He may also use local heat application as needed.  I will advise him to avoid lifting weights and bending abruptly.  Check EMG nerve conduction study and MRI scan of the lumbar spine.  He will return for follow-up in 3 months with my nurse practitioner call earlier if necessary. Greater than 50% of time during this 25 minute visit was spent on counseling,explanation of diagnosis of lacunar infarct, lumbar radiculopathy and herniated disc, planning of further management, discussion with patient and family and coordination of care Antony Contras, MD  [facility] [address] [place], [place] [address]  Phone [phone] Fax [phone] Note: This document was prepared with digital dictation and possible smart phrase technology. Any transcriptional errors that result from this process are unintentional

## 2018-11-25 NOTE — Patient Instructions (Signed)
I had a long d/w patient about his remote lacunar stroke, new back pain and lumbar radiculopathy symptoms,risk for recurrent stroke/TIAs, personally independently reviewed imaging studies and stroke evaluation results and answered questions.Continue Plavix  for secondary stroke prevention and maintain strict control of hypertension with blood pressure goal below 130/90, diabetes with hemoglobin A1c goal below 6.5% and lipids with LDL cholesterol goal below 70 mg/dL. I also advised the patient to eat a healthy diet with plenty of whole grains, cereals, fruits and vegetables, exercise regularly and maintain ideal body weight.I complimented him on his smoking cessation.  I recommend he do regular back stretching exercises and try Medrol Dosepak for his lumbar radiculopathy as well as gabapentin 100 mg twice daily for a week to be increased to 3 times daily as tolerated for his radicular pain.  He may also use local heat application as needed.  I will advise him to avoid lifting weights and bending abruptly.  Check EMG nerve conduction study and MRI scan of the lumbar spine.  He will return for follow-up in 3 months with my nurse practitioner call earlier if necessary.  Back Exercises These exercises help to make your trunk and back strong. They also help to keep the lower back flexible. Doing these exercises can help to prevent back pain or lessen existing pain.  If you have back pain, try to do these exercises 2-3 times each day or as told by your doctor.  As you get better, do the exercises once each day. Repeat the exercises more often as told by your doctor.  To stop back pain from coming back, do the exercises once each day, or as told by your doctor. Exercises Single knee to chest Do these steps 3-5 times in a row for each leg: 1. Lie on your back on a firm bed or the floor with your legs stretched out. 2. Bring one knee to your chest. 3. Grab your knee or thigh with both hands and hold them it in  place. 4. Pull on your knee until you feel a gentle stretch in your lower back or buttocks. 5. Keep doing the stretch for 10-30 seconds. 6. Slowly let go of your leg and straighten it. Pelvic tilt Do these steps 5-10 times in a row: 1. Lie on your back on a firm bed or the floor with your legs stretched out. 2. Bend your knees so they point up to the ceiling. Your feet should be flat on the floor. 3. Tighten your lower belly (abdomen) muscles to press your lower back against the floor. This will make your tailbone point up to the ceiling instead of pointing down to your feet or the floor. 4. Stay in this position for 5-10 seconds while you gently tighten your muscles and breathe evenly. Cat-cow Do these steps until your lower back bends more easily: 1. Get on your hands and knees on a firm surface. Keep your hands under your shoulders, and keep your knees under your hips. You may put padding under your knees. 2. Let your head hang down toward your chest. Tighten (contract) the muscles in your belly. Point your tailbone toward the floor so your lower back becomes rounded like the back of a cat. 3. Stay in this position for 5 seconds. 4. Slowly lift your head. Let the muscles of your belly relax. Point your tailbone up toward the ceiling so your back forms a sagging arch like the back of a cow. 5. Stay in this position for  5 seconds.  Press-ups Do these steps 5-10 times in a row: 1. Lie on your belly (face-down) on the floor. 2. Place your hands near your head, about shoulder-width apart. 3. While you keep your back relaxed and keep your hips on the floor, slowly straighten your arms to raise the top half of your body and lift your shoulders. Do not use your back muscles. You may change where you place your hands in order to make yourself more comfortable. 4. Stay in this position for 5 seconds. 5. Slowly return to lying flat on the floor.  Bridges Do these steps 10 times in a row: 1. Lie  on your back on a firm surface. 2. Bend your knees so they point up to the ceiling. Your feet should be flat on the floor. Your arms should be flat at your sides, next to your body. 3. Tighten your butt muscles and lift your butt off the floor until your waist is almost as high as your knees. If you do not feel the muscles working in your butt and the back of your thighs, slide your feet 1-2 inches farther away from your butt. 4. Stay in this position for 3-5 seconds. 5. Slowly lower your butt to the floor, and let your butt muscles relax. If this exercise is too easy, try doing it with your arms crossed over your chest. Belly crunches Do these steps 5-10 times in a row: 1. Lie on your back on a firm bed or the floor with your legs stretched out. 2. Bend your knees so they point up to the ceiling. Your feet should be flat on the floor. 3. Cross your arms over your chest. 4. Tip your chin a little bit toward your chest but do not bend your neck. 5. Tighten your belly muscles and slowly raise your chest just enough to lift your shoulder blades a tiny bit off of the floor. Avoid raising your body higher than that, because it can put too much stress on your low back. 6. Slowly lower your chest and your head to the floor. Back lifts Do these steps 5-10 times in a row: 1. Lie on your belly (face-down) with your arms at your sides, and rest your forehead on the floor. 2. Tighten the muscles in your legs and your butt. 3. Slowly lift your chest off of the floor while you keep your hips on the floor. Keep the back of your head in line with the curve in your back. Look at the floor while you do this. 4. Stay in this position for 3-5 seconds. 5. Slowly lower your chest and your face to the floor. Contact a doctor if:  Your back pain gets a lot worse when you do an exercise.  Your back pain does not get better 2 hours after you exercise. If you have any of these problems, stop doing the exercises. Do  not do them again unless your doctor says it is okay. Get help right away if:  You have sudden, very bad back pain. If this happens, stop doing the exercises. Do not do them again unless your doctor says it is okay. This information is not intended to replace advice given to you by your health care provider. Make sure you discuss any questions you have with your health care provider. Document Released: 06/03/2010 Document Revised: 01/24/2018 Document Reviewed: 01/24/2018 Elsevier Patient Education  2020 [employer].

## 2018-11-25 NOTE — Telephone Encounter (Signed)
[employer] medicare/medicaid order sent to [facility]. No auth they will reach out to the patient to schedule.  °

## 2018-11-30 ENCOUNTER — Ambulatory Visit
Admission: RE | Admit: 2018-11-30 | Discharge: 2018-11-30 | Disposition: A | Discharge status: Home / Self Care | Payer: Medicare Other | Source: Ambulatory Visit | Attending: Neurology | Admitting: Neurology

## 2018-11-30 ENCOUNTER — Other Ambulatory Visit: Payer: Self-pay

## 2018-11-30 DIAGNOSIS — M5416 Radiculopathy, lumbar region: Secondary | ICD-10-CM | POA: Diagnosis not present

## 2018-12-04 ENCOUNTER — Telehealth: Payer: Self-pay

## 2018-12-04 NOTE — Telephone Encounter (Signed)
Notes recorded by Marval Regal, RN on 12/04/2018 at 10:17 AM EDT  I called pt about the Mri scan of lumbar spine report.I stated is shows wear and tear with moderate spinal stenosis of L4-5 and L5-S!. Its unchanged compared with previous MRI from 2017. Pt verbalized understanding.

## 2018-12-04 NOTE — Telephone Encounter (Signed)
-----   Message from Garvin Fila, MD sent at 12/01/2018 11:23 AM EDT ----- [name] inform the patient that MRI scan of the lumbar spine shows changes of wear and tear with moderate spinal stenosis at L4-5 and L5-S1 but unchanged compared with previous MRI from 2017

## 2018-12-10 ENCOUNTER — Ambulatory Visit: Payer: Medicare Other | Admitting: Psychology

## 2019-01-08 ENCOUNTER — Other Ambulatory Visit: Payer: Self-pay

## 2019-01-08 ENCOUNTER — Ambulatory Visit (INDEPENDENT_AMBULATORY_CARE_PROVIDER_SITE_OTHER): Payer: Medicare Other | Admitting: Neurology

## 2019-01-08 ENCOUNTER — Encounter: Payer: Self-pay | Admitting: Neurology

## 2019-01-08 DIAGNOSIS — R531 Weakness: Secondary | ICD-10-CM

## 2019-01-08 DIAGNOSIS — M5416 Radiculopathy, lumbar region: Secondary | ICD-10-CM

## 2019-01-08 NOTE — Progress Notes (Signed)
Please refer to EMG and nerve conduction procedure note.  

## 2019-01-08 NOTE — Procedures (Signed)
     HISTORY:  Tracen Dunkerley is a 68 year old gentleman with a recent stroke event several weeks ago with onset of right back pain and pain down the right leg since that time.  The patient is being evaluated for possible a neuropathy or a radiculopathy as a source of his discomfort.  NERVE CONDUCTION STUDIES:  Nerve conduction studies were performed on the right lower extremity. The distal motor latencies and motor amplitudes for the peroneal and posterior tibial nerves were within normal limits. The nerve conduction velocities for these nerves were also normal. The sensory latencies for the peroneal and sural nerves were within normal limits. The F wave latency for the posterior tibial nerve was within normal limits.   EMG STUDIES:  EMG study was performed on the right lower extremity:  The tibialis anterior muscle reveals 2 to 4K motor units with full recruitment. No fibrillations or positive waves were seen. The peroneus tertius muscle reveals 2 to 4K motor units with slightly decreased recruitment. No fibrillations or positive waves were seen. The medial gastrocnemius muscle reveals 1 to 3K motor units with full recruitment. No fibrillations or positive waves were seen. The vastus lateralis muscle reveals 2 to 4K motor units with full recruitment. No fibrillations or positive waves were seen. The iliopsoas muscle reveals 2 to 4K motor units with full recruitment. No fibrillations or positive waves were seen. The biceps femoris muscle (long head) reveals 2 to 4K motor units with full recruitment. No fibrillations or positive waves were seen. The lumbosacral paraspinal muscles were tested at 3 levels, and revealed no abnormalities of insertional activity at all 3 levels tested. There was good relaxation.   IMPRESSION:  Nerve conduction studies done on the right lower extremity were within normal limits, no evidence of a neuropathy is seen.  EMG study of the right lower extremity was  relatively unremarkable without evidence of an overlying lumbosacral radiculopathy.  Jill Alexanders MD 01/08/2019 3:19 PM  [facility] [address] [place], [place] [address]  Phone [phone] Fax [phone]

## 2019-01-09 ENCOUNTER — Telehealth: Payer: Self-pay

## 2019-01-09 NOTE — Telephone Encounter (Signed)
Notes recorded by Marval Regal, RN on 01/09/2019 at 3:42 PM EDT  I called pt and gave results of EMG. I also spoke to [place] on dpr. I stated EMG nerve conduction was normal. There is no evidence of neuropathy or pinched nerve. PT verbalized understanding.  ------

## 2019-01-09 NOTE — Progress Notes (Signed)
[facility]    Nerve / Sites Muscle Latency Ref. Amplitude Ref. Rel Amp Segments Distance Velocity Ref. Area    ms ms mV mV %  cm m/s m/s mVms  R Peroneal - EDB     Ankle EDB 4.9 ?6.5 3.6 ?2.0 100 Ankle - EDB 9   11.9     Fib head EDB 12.4  3.6  100 Fib head - Ankle 34 46 ?44 11.9     Pop fossa EDB 14.6  3.8  105 Pop fossa - Fib head 10 45 ?44 12.1         Pop fossa - Ankle      R Tibial - AH     Ankle AH 2.8 ?5.8 7.0 ?4.0 100 Ankle - AH 9   16.0     Pop fossa AH 12.0  5.3  76.9 Pop fossa - Ankle 39 42 ?41 16.0         SNC    Nerve / Sites Rec. Site Peak Lat Ref.  Amp Ref. Segments Distance    ms ms V V  cm  R Sural - Ankle (Calf)     Calf Ankle 3.1 ?4.4 7 ?6 Calf - Ankle 14  R Superficial peroneal - Ankle     Lat leg Ankle 4.2 ?4.4 8 ?6 Lat leg - Ankle 14         F  Wave    Nerve F Lat Ref.   ms ms  R Tibial - AH 54.0 ?56.0

## 2019-01-09 NOTE — Telephone Encounter (Signed)
-----   Message from Garvin Fila, MD sent at 01/08/2019  3:56 PM EDT ----- [name] inform the patient that EMG nerve conduction study was normal.  There is no evidence of neuropathy or pinched nerve

## 2019-01-16 DIAGNOSIS — H25013 Cortical age-related cataract, bilateral: Secondary | ICD-10-CM | POA: Diagnosis not present

## 2019-01-16 DIAGNOSIS — H25012 Cortical age-related cataract, left eye: Secondary | ICD-10-CM | POA: Diagnosis not present

## 2019-01-16 DIAGNOSIS — H5371 Glare sensitivity: Secondary | ICD-10-CM | POA: Diagnosis not present

## 2019-01-16 DIAGNOSIS — H2513 Age-related nuclear cataract, bilateral: Secondary | ICD-10-CM | POA: Diagnosis not present

## 2019-01-16 DIAGNOSIS — H2512 Age-related nuclear cataract, left eye: Secondary | ICD-10-CM | POA: Diagnosis not present

## 2019-02-12 DIAGNOSIS — H2511 Age-related nuclear cataract, right eye: Secondary | ICD-10-CM | POA: Diagnosis not present

## 2019-02-12 DIAGNOSIS — H25012 Cortical age-related cataract, left eye: Secondary | ICD-10-CM | POA: Diagnosis not present

## 2019-02-12 DIAGNOSIS — H2512 Age-related nuclear cataract, left eye: Secondary | ICD-10-CM | POA: Diagnosis not present

## 2019-02-12 DIAGNOSIS — H25011 Cortical age-related cataract, right eye: Secondary | ICD-10-CM | POA: Diagnosis not present

## 2019-02-19 DIAGNOSIS — H25011 Cortical age-related cataract, right eye: Secondary | ICD-10-CM | POA: Diagnosis not present

## 2019-02-19 DIAGNOSIS — H2511 Age-related nuclear cataract, right eye: Secondary | ICD-10-CM | POA: Diagnosis not present

## 2019-02-25 ENCOUNTER — Telehealth: Payer: Self-pay

## 2019-02-25 ENCOUNTER — Ambulatory Visit: Payer: Medicare Other | Admitting: Adult Health

## 2019-02-25 NOTE — Telephone Encounter (Signed)
Informed [name] that the NP Frann Rider is not feeling well today and we needed to r/s his appt. He was not in but states she'd have him call us back.

## 2019-05-28 DIAGNOSIS — I10 Essential (primary) hypertension: Secondary | ICD-10-CM | POA: Diagnosis not present

## 2019-05-28 DIAGNOSIS — I25118 Atherosclerotic heart disease of native coronary artery with other forms of angina pectoris: Secondary | ICD-10-CM | POA: Diagnosis not present

## 2019-05-28 DIAGNOSIS — E785 Hyperlipidemia, unspecified: Secondary | ICD-10-CM | POA: Diagnosis not present

## 2019-05-28 DIAGNOSIS — I471 Supraventricular tachycardia: Secondary | ICD-10-CM | POA: Diagnosis not present

## 2019-10-23 ENCOUNTER — Encounter: Payer: Self-pay | Admitting: Pharmacist

## 2020-01-14 DIAGNOSIS — I471 Supraventricular tachycardia: Secondary | ICD-10-CM | POA: Diagnosis not present

## 2020-01-14 DIAGNOSIS — I10 Essential (primary) hypertension: Secondary | ICD-10-CM | POA: Diagnosis not present

## 2020-01-14 DIAGNOSIS — E785 Hyperlipidemia, unspecified: Secondary | ICD-10-CM | POA: Diagnosis not present

## 2020-01-14 DIAGNOSIS — I25118 Atherosclerotic heart disease of native coronary artery with other forms of angina pectoris: Secondary | ICD-10-CM | POA: Diagnosis not present

## 2020-01-14 DIAGNOSIS — I639 Cerebral infarction, unspecified: Secondary | ICD-10-CM | POA: Diagnosis not present

## 2020-07-16 ENCOUNTER — Other Ambulatory Visit: Payer: Self-pay

## 2020-07-16 ENCOUNTER — Ambulatory Visit (INDEPENDENT_AMBULATORY_CARE_PROVIDER_SITE_OTHER): Payer: Medicare Other | Admitting: Podiatry

## 2020-07-16 DIAGNOSIS — Q828 Other specified congenital malformations of skin: Secondary | ICD-10-CM | POA: Diagnosis not present

## 2020-07-20 ENCOUNTER — Encounter: Payer: Self-pay | Admitting: Podiatry

## 2020-07-20 NOTE — Progress Notes (Signed)
Subjective:  Patient ID: RENNE [name], male    DOB: 12/25/1950,  MRN: [id number]  Chief Complaint  Patient presents with  . Callouses    Bilateral heel calluses     70 y.o. male presents with the above complaint.  Patient presents with complaint left heel skin lesion that is very painful to walk on.  Patient states been dealing with this for long period of time.  He states is very dry in my heels which led to this lesion.  Is painful when ambulating sharp shooting pain in nature pain scale 7 out of 10.  He has not tried any treatment options he has not seen anyone else prior to seeing me for this.   Review of Systems: Negative except as noted in the HPI. Denies N/V/F/Ch.  Past Medical History:  Diagnosis Date  . Angina   . Arthritis   . Chronic lower back pain   . Coronary artery disease   . DJD (degenerative joint disease)   . GERD (gastroesophageal reflux disease)   . High cholesterol   . Hypertension   . 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 MI (myocardial infarction) ([place]) 2011  . Peptic ulcer disease   . Pneumonia    "when I was younger"  . Sinus bradycardia 09/28/11  . Stroke ([place])   . SVT (supraventricular tachycardia) ([place])   . TIA (transient ischemic attack)   . Walking pneumonia ~ 2009    Current Outpatient Medications:  .  amLODipine (NORVASC) 5 MG tablet, Take 5 mg by mouth daily., Disp: , Rfl:  .  atorvastatin (LIPITOR) 40 MG tablet, Take 1 tablet (40 mg total) by mouth daily., Disp: 30 tablet, Rfl: 0 .  clopidogrel (PLAVIX) 75 MG tablet, Take 1 tablet (75 mg total) by mouth daily., Disp: 30 tablet, Rfl: 0 .  gabapentin (NEURONTIN) 100 MG capsule, Take 1 capsule (100 mg total) by mouth 3 (three) times daily. Start 1 capsule twice daily x 1 week and then three times daily, Disp: 90 capsule, Rfl: 2 .  losartan (COZAAR) 25 MG tablet, , Disp: , Rfl:   Social History   Tobacco Use  Smoking Status Former Smoker  . Packs/day: 0.50  . Years: 52.00  . Pack years: 26.00  .  Types: Cigarettes  . Quit date: 02/26/2017  . Years since quitting: 3.3  Smokeless Tobacco Never Used    No Known Allergies Objective:  There were no vitals filed for this visit. There is no height or weight on file to calculate BMI. Constitutional Well developed. Well nourished.  Vascular Dorsalis pedis pulses palpable bilaterally. Posterior tibial pulses palpable bilaterally. Capillary refill normal to all digits.  No cyanosis or clubbing noted. Pedal hair growth normal.  Neurologic Normal speech. Oriented to person, place, and time. Epicritic sensation to light touch grossly present bilaterally.  Dermatologic  left heel hyperkeratotic lesion with central nucleated core noted.  Pain on palpation to the lesion  Orthopedic: Normal joint ROM without pain or crepitus bilaterally. No visible deformities. No bony tenderness.   Radiographs: None Assessment:   1. Porokeratosis    Plan:  Patient was evaluated and treated and all questions answered.  Left heel porokeratosis -I explained to the patient the etiology of porokeratosis and various treatment options were discussed.  Given the amount of pain that is having I believe patient would benefit from aggressive debridement of the lesion followed by adequate padding.  Offloading pads were dispensed. -If there is no improvement we will discuss doing  catheter therapy during next clinical visit.  No follow-ups on file.

## 2020-09-01 ENCOUNTER — Ambulatory Visit: Payer: Medicare Other | Admitting: Podiatry

## 2020-09-22 DIAGNOSIS — I700 Atherosclerosis of aorta: Secondary | ICD-10-CM | POA: Diagnosis not present

## 2020-09-22 DIAGNOSIS — K3189 Other diseases of stomach and duodenum: Secondary | ICD-10-CM | POA: Diagnosis not present

## 2020-09-22 DIAGNOSIS — R1031 Right lower quadrant pain: Secondary | ICD-10-CM | POA: Diagnosis not present

## 2020-10-06 DIAGNOSIS — M5416 Radiculopathy, lumbar region: Secondary | ICD-10-CM | POA: Diagnosis not present

## 2020-10-06 DIAGNOSIS — I471 Supraventricular tachycardia: Secondary | ICD-10-CM | POA: Diagnosis not present

## 2020-10-06 DIAGNOSIS — E785 Hyperlipidemia, unspecified: Secondary | ICD-10-CM | POA: Diagnosis not present

## 2020-10-06 DIAGNOSIS — I10 Essential (primary) hypertension: Secondary | ICD-10-CM | POA: Diagnosis not present

## 2020-10-06 DIAGNOSIS — I25118 Atherosclerotic heart disease of native coronary artery with other forms of angina pectoris: Secondary | ICD-10-CM | POA: Diagnosis not present

## 2020-11-30 ENCOUNTER — Telehealth: Payer: Self-pay

## 2020-11-30 DIAGNOSIS — Z9189 Other specified personal risk factors, not elsewhere classified: Secondary | ICD-10-CM

## 2020-11-30 NOTE — Progress Notes (Signed)
[facility])                                            [facility] Team                                        Statin Quality Measure Assessment    11/30/2020  [name] 30-Dec-1950 [id number]  I called and spoke with this patient re: no PCP. He stated that he has not established care at [facility] this year.However, he was supposed to fill out paperwork in January to be re-established but there was a 3 week waitlist. I reached out to Jonna Coup and was informed patient did not complete his paperwork and will need to re-apply and return the packet. There is a waitlist in which next available appointment would be end of August, beginning of September. I called pt again today and he stated that he will go to the clinic tomorrow to complete the paperwork.     Thank you for your time,  [name], [place] Cell: [phone]

## 2021-01-05 ENCOUNTER — Encounter (HOSPITAL_COMMUNITY): Payer: Self-pay

## 2021-01-05 ENCOUNTER — Emergency Department (HOSPITAL_COMMUNITY)
Admission: EM | Admit: 2021-01-05 | Discharge: 2021-01-05 | Disposition: A | Discharge status: Home / Self Care | Payer: Medicare Other | Attending: Emergency Medicine | Admitting: Emergency Medicine

## 2021-01-05 ENCOUNTER — Emergency Department (HOSPITAL_COMMUNITY): Payer: Medicare Other

## 2021-01-05 DIAGNOSIS — I10 Essential (primary) hypertension: Secondary | ICD-10-CM | POA: Diagnosis not present

## 2021-01-05 DIAGNOSIS — Z955 Presence of coronary angioplasty implant and graft: Secondary | ICD-10-CM | POA: Insufficient documentation

## 2021-01-05 DIAGNOSIS — G8929 Other chronic pain: Secondary | ICD-10-CM | POA: Diagnosis not present

## 2021-01-05 DIAGNOSIS — I2510 Atherosclerotic heart disease of native coronary artery without angina pectoris: Secondary | ICD-10-CM | POA: Insufficient documentation

## 2021-01-05 DIAGNOSIS — M545 Low back pain, unspecified: Secondary | ICD-10-CM | POA: Diagnosis not present

## 2021-01-05 DIAGNOSIS — Z79899 Other long term (current) drug therapy: Secondary | ICD-10-CM | POA: Diagnosis not present

## 2021-01-05 DIAGNOSIS — Z87891 Personal history of nicotine dependence: Secondary | ICD-10-CM | POA: Diagnosis not present

## 2021-01-05 LAB — URINALYSIS, ROUTINE W REFLEX MICROSCOPIC
Bilirubin Urine: NEGATIVE
Glucose, UA: NEGATIVE mg/dL
Hgb urine dipstick: NEGATIVE
Ketones, ur: NEGATIVE mg/dL
Leukocytes,Ua: NEGATIVE
Nitrite: NEGATIVE
Protein, ur: NEGATIVE mg/dL
Specific Gravity, Urine: 1.013 (ref 1.005–1.030)
pH: 5 (ref 5.0–8.0)

## 2021-01-05 MED ORDER — OXYCODONE-ACETAMINOPHEN 5-325 MG PO TABS
1.0000 | ORAL_TABLET | Freq: Once | ORAL | Status: AC
  Administered 2021-01-05: 1 via ORAL
  Filled 2021-01-05: qty 1

## 2021-01-05 MED ORDER — PREDNISONE 10 MG PO TABS: 20.0000 mg | ORAL_TABLET | Freq: Every day | ORAL | 0 refills | Status: DC

## 2021-01-05 MED ORDER — DEXAMETHASONE SODIUM PHOSPHATE 10 MG/ML IJ SOLN
10.0000 mg | Freq: Once | INTRAMUSCULAR | Status: AC
  Administered 2021-01-05: 10 mg via INTRAMUSCULAR
  Filled 2021-01-05: qty 1

## 2021-01-05 NOTE — ED Provider Notes (Signed)
Emergency Medicine Provider Triage Evaluation Note  [name] , a 70 y.o. male  was evaluated in triage.  Pt complains of low back pain that radiates down right groin to right knee. Pain is constant, worsened with ambulation. 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 Last IV drug use > 13 years ago. No prior back surgeries.   Review of Systems  Positive: Back pain Negative: Fever, urinary retention, saddles anesthesia, bilateral leg weakness  Physical Exam  BP (!) 146/69 (BP Location: Left Arm)   Pulse 66   Temp 98.2 F (36.8 C) (Oral)   Resp 18   SpO2 98%  Gen:   Awake, no distress   Resp:  Normal effort  MSK:   Moves extremities without difficulty  Other:  CN III-XII grossly in tact. Grip strenght equal. Ambulatory.   Medical Decision Making  Medically screening exam initiated at 3:01 PM.  Appropriate orders placed.  Rutha Bouchard was informed that the remainder of the evaluation will be completed by another provider, this initial triage assessment does not replace that evaluation, and the importance of remaining in the ED until their evaluation is complete.     Sherrill Raring, PA-C 01/05/21 1503    Regan Lemming, MD 01/05/21 [phone]

## 2021-01-05 NOTE — ED Provider Notes (Signed)
[name] Provider Note   CSN: [id number] Arrival date & time: 01/05/21  1355     History Chief Complaint  Patient presents with   Back Pain   Groin Pain    [name] is a 70 y.o. male.   Back Pain Associated symptoms: no dysuria, no fever, no numbness and no weakness   Groin Pain   Patient presents with low back pain that radiates down his right leg and right groin.  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  Nothing fell on him and there is no trauma, however he reports laying on the ground working on a car and getting up and feeling significant pain in his back that radiates down his right leg and right groin.  Pain has been constant since then, not worsening.  Denies any alleviating factors, aggravating factors include ambulation and touch.  Pain is worse on the right side of his back.  Denies any urinary retention, saddle anesthesia, bilateral leg pain, fevers, previous spinal surgeries.  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  Denies testicular pain, denies testicular swelling, denies penis pain, denies penile discharge, denies dysuria.  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   MI (myocardial infarction) ([place]) 2011   Peptic ulcer disease    Pneumonia    "when I was younger"   Sinus bradycardia 09/28/11   Stroke [facility])    SVT (supraventricular tachycardia) ([place])    TIA (transient ischemic attack)    Walking pneumonia ~ 2009    Patient Active Problem List   Diagnosis Date Noted   Malnutrition of moderate degree 02/14/2017   Right sided weakness 02/13/2017   Bradycardia 02/13/2017   Chest pain 08/19/2016   Chest pain at rest 06/01/2013   Rhinosinusitis  02/24/2013   Abrasion of cornea, right 07/18/2012   Cough 07/01/2012   Acute coronary syndrome ([place]) 09/28/2011   MI (myocardial infarction) ([place])    HIP PAIN, RIGHT 06/09/2010   TOBACCO USER 02/20/2009   Hyperlipidemia with target low density lipoprotein (LDL) cholesterol less than 70 mg/dL 01/12/2009   SUBSTANCE ABUSE, MULTIPLE 01/12/2009   HYPERTENSION 01/12/2009   SUPRAVENTRICULAR TACHYCARDIA 01/12/2009   GERD 01/12/2009   Osteoarthritis 01/12/2009   TOBACCO ABUSE, HX OF 01/12/2009   HYPERTENSION, BENIGN ESSENTIAL 07/21/2008   Coronary atherosclerosis 07/12/2006   GASTRIC ULCER ACUTE WITH HEMORRHAGE 07/12/2006    Past Surgical History:  Procedure Laterality Date   CARDIAC CATHETERIZATION     COLECTOMY  1990's   "ulcer burst; took out part of my colon"   CORONARY ANGIOPLASTY WITH STENT PLACEMENT  08/2006   "1" cypher drug eluting stent ok for up to [place] N/A 09/29/2011   Procedure: [place];  Surgeon: [name], MD;  Location: [place] CATH LAB;  Service: Cardiovascular;  Laterality: N/A;       Family History  Problem Relation Age of Onset   Colon cancer Father    Rectal cancer Neg Hx    Stomach cancer Neg Hx    Esophageal cancer Neg Hx  Social History   Tobacco Use   Smoking status: Former    Packs/day: 0.50    Years: 52.00    Pack years: 26.00    Types: Cigarettes    Quit date: 02/26/2017    Years since quitting: 3.8   Smokeless tobacco: Never  Vaping Use   Vaping Use: Never used  Substance Use Topics   Alcohol use: Yes    Alcohol/week: 12.0 standard drinks    Types: 12 Cans of beer per week   Drug use: Yes    Types: Marijuana, Cocaine    Comment: ; cocaine ~ 2001", still smoke marijuana for pain and leisure    Home Medications Prior to Admission medications   Medication Sig Start Date End Date Taking? Authorizing Provider  amLODipine (NORVASC) 5 MG  tablet Take 5 mg by mouth daily.    [provider]  atorvastatin (LIPITOR) 40 MG tablet Take 1 tablet (40 mg total) by mouth daily. 02/16/17 11/25/18  Doreatha Lew, MD  clopidogrel (PLAVIX) 75 MG tablet Take 1 tablet (75 mg total) by mouth daily. 02/17/17   Doreatha Lew, MD  gabapentin (NEURONTIN) 100 MG capsule Take 1 capsule (100 mg total) by mouth 3 (three) times daily. Start 1 capsule twice daily x 1 week and then three times daily 11/25/18 11/25/19  Garvin Fila, MD  losartan (COZAAR) 25 MG tablet  10/11/18   [provider]    Allergies    Patient has no known allergies.  Review of Systems   Review of Systems  Constitutional:  Negative for fatigue and fever.  Gastrointestinal:  Negative for nausea and vomiting.  Genitourinary:  Negative for dysuria and flank pain.  Musculoskeletal:  Positive for back pain. Negative for gait problem and joint swelling.  Skin:  Negative for wound.  Neurological:  Negative for weakness and numbness.   Physical Exam Updated Vital Signs BP (!) 151/74   Pulse 60   Temp 98.2 F (36.8 C) (Oral)   Resp 19   SpO2 100%   Physical Exam Vitals and nursing note reviewed. Exam conducted with a chaperone present.  Constitutional:      Appearance: Normal appearance.     Comments: Patient is resting comfortably, he is ambulatory.  HENT:     Head: Normocephalic and atraumatic.  Eyes:     General: No scleral icterus.       Right eye: No discharge.        Left eye: No discharge.     Extraocular Movements: Extraocular movements intact.     Pupils: Pupils are equal, round, and reactive to light.  Cardiovascular:     Rate and Rhythm: Normal rate and regular rhythm.     Pulses: Normal pulses.     Heart sounds: Normal heart sounds. No murmur heard.   No friction rub. No gallop.     Comments: DP and PT are 2+ bilaterally Pulmonary:     Effort: Pulmonary effort is normal. No respiratory distress.     Breath sounds: Normal  breath sounds.  Abdominal:     General: Abdomen is flat. Bowel sounds are normal. There is no distension.     Palpations: Abdomen is soft.     Tenderness: There is no abdominal tenderness.  Musculoskeletal:        General: Tenderness present.     Comments: Patient has right paraspinal tenderness, no midline tenderness.  No tenderness or decreased range of motion to the inguinal muscles.  No calf tenderness,  no leg swelling.  Skin:    General: Skin is warm and dry.     Coloration: Skin is not jaundiced.  Neurological:     Mental Status: He is alert. Mental status is at baseline.     Coordination: Coordination normal.     Comments: Cranial nerves III through XII are grossly intact.  Grip strength is equal bilaterally.  Lower extremity strength is equal bilaterally.  Sensation is grossly in tact and symmetrical to the lower extremity and upper extremity.    ED Results / Procedures / Treatments   Labs (all labs ordered are listed, but only abnormal results are displayed) Labs Reviewed  URINALYSIS, ROUTINE W REFLEX MICROSCOPIC    EKG None  Radiology No results found.  Procedures Procedures   Medications Ordered in ED Medications - No data to display  ED Course  I have reviewed the triage vital signs and the nursing notes.  Pertinent labs & imaging results that were available during my care of the patient were reviewed by me and considered in my medical decision making (see chart for details).  Clinical Course as of 01/05/21 1721  Wed Jan 05, 2021  1718 Urinalysis, Routine w reflex microscopic No UTI [HS]    Clinical Course User Index [HS] Sherrill Raring, PA-C   MDM Rules/Calculators/A&P                           Patient has stable vitals, he is not febrile or tachycardic.  No focal deficits on exam, patient is also ambulatory.    He does have a history of IV drug use, but has not used in a long time.  Given that the sudden onset is likely mechanical, I suspect the pain  is likely exacerbation of his chronic pain.  However, given his history I will order a CT lumbar to make sure there are no changes.  He also has a history of stenosis, will make sure that there is no worsening or fractures.  I doubt spinal abscess given there is no fever or tachycardia.  Although the pain radiates to the back, I have very low suspicion for testicular torsion.  I suspect this is a radiculopathy, will abstain from ultrasound at this time.  Patient is requesting medicine for pain control.  We discussed the risk and benefits of narcotics, he states he is not supposed to be on NSAIDs due to history of GERD and ulcers.  We discussed that he has a history of drug abuse, but he states he is not tempted anymore.  He states the oral does not have the same eyes as IV anyway.  Discussed risks and benefits, will give 1 pill for relief here in the ED when he is supervised.    CT:  progression lumbar disc and facet degeneration  since 2020 with severe bilateral neural foraminal stenosis at L5-S1  and moderate to severe neural foraminal stenosis at L4-5.   UA: No evidence of UTI  Discussed with patient that his symptoms are likely due to exacerbation of his chronic issue.  We will give him a shot of Decadron given that he is not supposed to be on anti-inflammatory medicine.  We will also give 5 days of prednisone to help with inflammation and have him follow-up with his primary care doctor.    Final Clinical Impression(s) / ED Diagnoses Final diagnoses:  None    Rx / DC Orders ED Discharge Orders  None        Sherrill Raring, PA-C 01/05/21 1724    Regan Lemming, MD 01/05/21 [phone]

## 2021-01-05 NOTE — Discharge Instructions (Addendum)
Take prednisone twice daily for the next 5 days. Please make an appointment to follow-up with your primary care doctor for reevaluation. If you develop pain in the testicles or swelling in the testicles please return back to the ED for additional evaluation.

## 2021-01-05 NOTE — ED Triage Notes (Signed)
Lower back pain that radiates around the right side down right leg to right groin X2 days.   8/10 pain. Reports otc pain medication with no relief.   A/Ox4 Ambulatory in triage.

## 2021-01-21 DIAGNOSIS — M545 Low back pain, unspecified: Secondary | ICD-10-CM | POA: Diagnosis not present

## 2021-02-14 NOTE — Progress Notes (Signed)
[name])                                            [place] Team                                        Statin Quality Measure Assessment    02/14/2021  [name] 09-Jan-1951 [id number]  I                                       [place] [facility])                                            [place] Team                                        Statin Quality Measure Assessment    02/14/2021  [name] 04-19-1951 [id number]  Per review of chart and payor information, this patient has been flagged for non-adherence to the following CMS Quality Measure:   []  Statin Use in Persons with Diabetes  [x]  Statin Use in Persons with Cardiovascular Disease  The ASCVD Risk score (Arnett DK, et al., 2019) failed to calculate for the following reasons:   The patient has a prior MI or stroke diagnosis  No results found for requested labs within last 26280 hours.  Currently prescribed statin:  [x]  Yes []  No     Comments: Pravastatin 40 mg - as per discussion with cardiology clinic staff and patient reported. Pharmacy prescription claims indicated pravastatin 40 mg daily was last picked up on 02/03/2020 and a prescription for rosuvastatin back in April 2022. In July, of this year, patient reported he had 20-30 tablets remaining of pravastatin; unsure where patient is filling prescription. Due to limited EMR access w/cardiologist, it is unclear whether patient is supposed to switch to crestor and if patient is compliant with statin medication.     Please consider ONE of the following recommendations:   Initiate high intensity statin Atorvastatin 40mg  once daily, #90, 3 refills   Rosuvastatin 20mg  once daily, #90, 3 refills    Initiate moderate intensity          statin with reduced frequency if prior          statin intolerance 1x weekly, #13, 3 refills   2x weekly, #26, 3 refills   3x weekly, #39, 3  refills    Code for past statin intolerance or other exclusions (required annually)  Drug Induced Myopathy G72.0   Myositis, unspecified M60.9   Rhabdomyolysis M62.82   Prediabetes R73.03   Adverse effect of antihyperlipidemic and antiarteriosclerotic drugs, initial encounter R48.5I6E    Thank you for your time,  [name], [place] Cell: [phone]

## 2021-02-15 ENCOUNTER — Ambulatory Visit (INDEPENDENT_AMBULATORY_CARE_PROVIDER_SITE_OTHER): Payer: Medicare Other | Admitting: Family Medicine

## 2021-02-15 ENCOUNTER — Encounter: Payer: Self-pay | Admitting: Family Medicine

## 2021-02-15 ENCOUNTER — Other Ambulatory Visit: Payer: Self-pay

## 2021-02-15 VITALS — BP 137/76 | HR 67 | Ht 72.84 in | Wt 149.8 lb

## 2021-02-15 DIAGNOSIS — I219 Acute myocardial infarction, unspecified: Secondary | ICD-10-CM

## 2021-02-15 DIAGNOSIS — E785 Hyperlipidemia, unspecified: Secondary | ICD-10-CM

## 2021-02-15 DIAGNOSIS — E440 Moderate protein-calorie malnutrition: Secondary | ICD-10-CM

## 2021-02-15 DIAGNOSIS — M545 Low back pain, unspecified: Secondary | ICD-10-CM | POA: Diagnosis not present

## 2021-02-15 DIAGNOSIS — G8929 Other chronic pain: Secondary | ICD-10-CM | POA: Diagnosis not present

## 2021-02-15 DIAGNOSIS — Z1159 Encounter for screening for other viral diseases: Secondary | ICD-10-CM

## 2021-02-15 DIAGNOSIS — I10 Essential (primary) hypertension: Secondary | ICD-10-CM | POA: Diagnosis not present

## 2021-02-15 NOTE — Progress Notes (Signed)
    SUBJECTIVE:   CHIEF COMPLAINT / HPI:   HTN  Patient reports he follows with cardiology and has been seeing them regularly.  He recently saw them and they ordered lab work but the patient did not have a primary care provider and so they told him to go to [place] to have the labs drawn.  They did not want to go to [facility] so he is here to reestablish care with [place].  He has not been seen in many years.  Reports that his blood pressure is well controlled on his medications.  Denies any chest pain, shortness of breath, headaches.  Back pain Patient reports chronic low back pain.  He has not been taking any medications other than occasional ibuprofen or using any topical ointments.  Discussed use of topical medications and Tylenol.  Denies any red flag symptoms such as issues with urination or bowel movements, weakness.  OBJECTIVE:   BP 137/76   Pulse 67   Ht 6' 0.84" (1.85 m)   Wt 149 lb 12.8 oz (67.9 kg)   SpO2 98%   BMI 19.85 kg/m   General: Well-appearing 70 year old male, no acute distress cardiac: Regular rate and rhythm, no murmurs appreciated Respiratory: Normal for breathing, lungs clear to station bilaterally Abdomen: Soft, nontender, positive bowel sounds MSK: No gross abnormalities  ASSESSMENT/PLAN:   HYPERTENSION, BENIGN ESSENTIAL Mildly elevated blood pressure today.  Ordering CMP, CBC, lipid panel today given his cardiac history.  We will continue current medications per cardiology and they will continue to monitor this.  Strict return precautions given.  Chronic low back pain Patient reports he has chronic low back pain.  He has been taking intermittent ibuprofen without relief.  Discussed use of topical ointments and Tylenol.  He will try this and work on stretching.  Strict ED and return precautions given.     Gifford Shave, MD [place]

## 2021-02-15 NOTE — Patient Instructions (Addendum)
It was great seeing you today.  I ordered some lab work so I feel like your cardiologist may have wanted.  I am going to check your cholesterol as well as your kidney function.  I am also going to check to make sure you are not anemic.  I will call you with these results if there are any abnormalities or send you a MyChart message if they are normal.  Please activate your MyChart.  For your back pain you can take over-the-counter Tylenol.  Please avoid ibuprofen. If you have any questions or concerns call the clinic.  I hope you have a wonderful afternoon!

## 2021-02-15 NOTE — Assessment & Plan Note (Signed)
Mildly elevated blood pressure today.  Ordering CMP, CBC, lipid panel today given his cardiac history.  We will continue current medications per cardiology and they will continue to monitor this.  Strict return precautions given.

## 2021-02-15 NOTE — Assessment & Plan Note (Signed)
Patient reports he has chronic low back pain.  He has been taking intermittent ibuprofen without relief.  Discussed use of topical ointments and Tylenol.  He will try this and work on stretching.  Strict ED and return precautions given.

## 2021-02-16 LAB — CBC
Hematocrit: 42.6 % (ref 37.5–51.0)
Hemoglobin: 13.7 g/dL (ref 13.0–17.7)
MCH: 23.3 pg — ABNORMAL LOW (ref 26.6–33.0)
MCHC: 32.2 g/dL (ref 31.5–35.7)
MCV: 73 fL — ABNORMAL LOW (ref 79–97)
Platelets: 317 10*3/uL (ref 150–450)
RBC: 5.87 x10E6/uL — ABNORMAL HIGH (ref 4.14–5.80)
RDW: 15.3 % (ref 11.6–15.4)
WBC: 6 10*3/uL (ref 3.4–10.8)

## 2021-02-16 LAB — COMPREHENSIVE METABOLIC PANEL
ALT: 11 IU/L (ref 0–44)
AST: 17 IU/L (ref 0–40)
Albumin/Globulin Ratio: 1.9 (ref 1.2–2.2)
Albumin: 4.3 g/dL (ref 3.8–4.8)
Alkaline Phosphatase: 42 IU/L — ABNORMAL LOW (ref 44–121)
BUN/Creatinine Ratio: 10 (ref 10–24)
BUN: 11 mg/dL (ref 8–27)
Bilirubin Total: 0.5 mg/dL (ref 0.0–1.2)
CO2: 21 mmol/L (ref 20–29)
Calcium: 9.5 mg/dL (ref 8.6–10.2)
Chloride: 101 mmol/L (ref 96–106)
Creatinine, Ser: 1.05 mg/dL (ref 0.76–1.27)
Globulin, Total: 2.3 g/dL (ref 1.5–4.5)
Glucose: 74 mg/dL (ref 70–99)
Potassium: 4.7 mmol/L (ref 3.5–5.2)
Sodium: 137 mmol/L (ref 134–144)
Total Protein: 6.6 g/dL (ref 6.0–8.5)
eGFR: 77 mL/min/{1.73_m2} (ref >59)

## 2021-02-16 LAB — HEPATITIS C ANTIBODY: Hep C Virus Ab: <0.1 s/co ratio (ref 0.0–0.9)

## 2021-02-16 LAB — LIPID PANEL
Chol/HDL Ratio: 2 ratio (ref 0.0–5.0)
Cholesterol, Total: 189 mg/dL (ref 100–199)
HDL: 96 mg/dL (ref >39)
LDL Chol Calc (NIH): 78 mg/dL (ref 0–99)
Triglycerides: 85 mg/dL (ref 0–149)
VLDL Cholesterol Cal: 15 mg/dL (ref 5–40)

## 2021-04-15 ENCOUNTER — Telehealth: Payer: Self-pay | Admitting: Family Medicine

## 2021-04-15 NOTE — Telephone Encounter (Signed)
Attempted to call patient regarding statin use.  Unable to get in touch with him and it appears his phone number is changed.

## 2021-05-24 ENCOUNTER — Encounter: Payer: Self-pay | Admitting: *Deleted

## 2021-05-27 ENCOUNTER — Ambulatory Visit: Payer: Medicare Other | Admitting: Gastroenterology

## 2021-06-16 DIAGNOSIS — E785 Hyperlipidemia, unspecified: Secondary | ICD-10-CM | POA: Diagnosis not present

## 2021-06-16 DIAGNOSIS — I10 Essential (primary) hypertension: Secondary | ICD-10-CM | POA: Diagnosis not present

## 2021-06-16 DIAGNOSIS — I25118 Atherosclerotic heart disease of native coronary artery with other forms of angina pectoris: Secondary | ICD-10-CM | POA: Diagnosis not present

## 2021-06-16 DIAGNOSIS — F17290 Nicotine dependence, other tobacco product, uncomplicated: Secondary | ICD-10-CM | POA: Diagnosis not present

## 2021-12-26 ENCOUNTER — Encounter: Payer: Self-pay | Admitting: Gastroenterology

## 2022-01-04 ENCOUNTER — Encounter: Payer: Self-pay | Admitting: Gastroenterology

## 2022-02-15 ENCOUNTER — Ambulatory Visit: Payer: Medicare Other | Admitting: Gastroenterology

## 2022-02-15 NOTE — Progress Notes (Deleted)
Referring Provider: Jacelyn Grip, MD Primary Care Physician:  Jacelyn Grip, MD  Reason for Consultation:  History of polyps   IMPRESSION:  Personal history of colon polyp    - tubular adenoma removed 07/07/09 [name])    - 4 tubular adenomas removed 11/18/2018    -Surveillance colonoscopy recommended in 2023 Plavix use (managed by Dr. Terrence Dupont) Family history of colon cancer (father 19s, brother 74, cousin in his 63s)  Surveillance colonoscopy recommended. Will need to hold Plavix for 5 days before endoscopy.  I discussed with the patient that there is a low, but real, risk of a cardiovascular event such as heart attack, stroke, or embolism/thrombosis while off Plavix. Will communicate by phone or EMR with patient's prescribing provider to confirm that holding the Plavix is appropriate at this time.   [name] has a strong family history of colon cancer.  I previously recommended genetic counseling to guide appropriate surveillance in Mr. Monday as well as his 14 children.  He declined referral for genetic counseling at this time.  PLAN: Colonoscopy after a Plavix washout     HPI: [name] is a 71 y.o. male due for surveillance colonoscopy.  He was last seen for colonoscopy in 2020.  The interval history is obtained through the patient review of his electronic health record.  His history includes hypertension, CVA 02/2017, chronic low back pain, CAD s/p MI 2011, history of bradycardia. He is on Plavix.  He uses marijuana regularly.   Endoscopic history:  - Screening colonoscopy with Dr. [name] 07/07/09: 38m ascending colon tubular adenoma. Surveillance recommended in 5 years.  - Colonoscopy 11/18/2018 revealed four 2 to 5 mm tubular adenomas in the sigmoid, ascending colon, splenic flexure, and IC valve.  There were multiple hyperplastic polyps in the rectum that were not removed.  GI review of systems is negative.  Father had colon cancer in his 720s brother with colon cancer at age  353 cousin with colon cancer at age 69 He has 14 children. No other known family history of colon cancer or polyps. No family history of uterine/endometrial cancer, pancreatic cancer or gastric/stomach cancer.  Past Medical History:  Diagnosis Date   Angina    Arthritis    Chronic lower back pain    Coronary artery disease    DJD (degenerative joint disease)    GERD (gastroesophageal reflux disease)    High cholesterol    Hypertension    [place]   [place]   Peptic ulcer disease    Pneumonia    "when I was younger"   Sinus bradycardia 09/28/2011   [place]    SVT (supraventricular tachycardia) ([facility])    TIA (transient ischemic attack)    Tubular adenoma of colon    Walking pneumonia ~ 2009    Past Surgical History:  Procedure Laterality Date   CARDIAC CATHETERIZATION     COLECTOMY  1990's   "ulcer burst; took out part of my colon"   CORONARY ANGIOPLASTY WITH STENT PLACEMENT  08/2006   "1" cypher drug eluting [place] 09[place]   [place]  Surgeon: MClent Demark MD;  [facility];  Service: Cardiovascular;  Laterality: N/A;    Current Outpatient Medications  Medication Sig Dispense Refill   amLODipine (NORVASC) 5 MG tablet Take 5 mg by mouth daily.  atorvastatin (LIPITOR) 40 MG tablet Take 1 tablet (40 mg total) by mouth daily. 30 tablet 0   clopidogrel (PLAVIX) 75 MG tablet Take 1 tablet (75 mg total) by mouth daily. 30 tablet 0   gabapentin (NEURONTIN) 100 MG capsule Take 1 capsule (100 mg total) by mouth 3 (three) times daily. Start 1 capsule twice daily x 1 week and then three times daily 90 capsule 2   losartan (COZAAR) 25 MG tablet      predniSONE (DELTASONE) 10 MG tablet Take 2 tablets (20 mg total) by mouth daily. 15 tablet 0   No current facility-administered medications for  this visit.    Allergies as of 02/15/2022   (No Known Allergies)    Family History  Problem Relation Age of Onset   Colon cancer Father    Rectal cancer Neg Hx    Stomach cancer Neg Hx    Esophageal cancer Neg Hx     Social History   Socioeconomic History   Marital status: Single    Spouse name: Not on file   Number of children: 25   Years of education: Not on file   Highest education level: Not on file  Occupational History   Not on file  Tobacco Use   Smoking status: Former    Packs/day: 0.50    Years: 52.00    Total pack years: 26.00    Types: Cigarettes    Quit date: 02/26/2017    Years since quitting: 4.9   Smokeless tobacco: Never  Vaping Use   Vaping Use: Never used  Substance and Sexual Activity   Alcohol use: Yes    Alcohol/week: 12.0 standard drinks of alcohol    Types: 12 Cans of beer per week   Drug use: Yes    Types: Marijuana, Cocaine    Comment: ; cocaine ~ 2001", still smoke marijuana for pain and leisure   Sexual activity: Yes  Other Topics Concern   Not on file  Social History Narrative   Not on file   Social Determinants of Health   Financial Resource Strain: Not on file  Food Insecurity: Not on file  Transportation Needs: Not on file  Physical Activity: Not on file  Stress: Not on file  Social Connections: Not on file  Intimate Partner Violence: Not on file    Review of Systems: ALL ROS discussed and all others negative except listed in HPI.  Physical Exam: Complete physical exam not performed due to the limits inherent in a telehealth encounter.  General: Awake, alert, and oriented, and well communicative. In no acute distress.  Pulm: No labored breathing, speaking in full sentences without conversational dyspnea  Psych: Pleasant, cooperative, normal speech, normal affect and normal insight Neuro: Alert and appropriate   Itzamara Casas L. Tarri Glenn, MD, MPH [facility] 02/15/2022, 8:23 AM

## 2022-03-30 DIAGNOSIS — I25118 Atherosclerotic heart disease of native coronary artery with other forms of angina pectoris: Secondary | ICD-10-CM | POA: Diagnosis not present

## 2022-03-30 DIAGNOSIS — E785 Hyperlipidemia, unspecified: Secondary | ICD-10-CM | POA: Diagnosis not present

## 2022-03-30 DIAGNOSIS — I10 Essential (primary) hypertension: Secondary | ICD-10-CM | POA: Diagnosis not present

## 2022-04-20 ENCOUNTER — Ambulatory Visit: Payer: Medicare Other | Admitting: Family Medicine

## 2022-04-20 NOTE — Progress Notes (Incomplete)
    SUBJECTIVE:   CHIEF COMPLAINT / HPI:   CAD, history of MI Taking statin?  Stroke Taking antiplts?  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  ***  ASSESSMENT/PLAN:   No problem-specific Assessment & Plan notes found for this encounter.   Lipid panel needed  Needs colonoscopy, shngrix, PCV, flu, COVID Lung screening?  Ethelene Hal, MD [place]

## 2022-05-11 ENCOUNTER — Encounter (HOSPITAL_COMMUNITY): Payer: Self-pay

## 2022-05-11 ENCOUNTER — Other Ambulatory Visit: Payer: Self-pay

## 2022-05-11 ENCOUNTER — Ambulatory Visit (INDEPENDENT_AMBULATORY_CARE_PROVIDER_SITE_OTHER): Payer: Medicare Other

## 2022-05-11 ENCOUNTER — Ambulatory Visit (HOSPITAL_COMMUNITY)
Admission: RE | Admit: 2022-05-11 | Discharge: 2022-05-11 | Disposition: A | Discharge status: Home / Self Care | Payer: Medicare Other | Source: Ambulatory Visit

## 2022-05-11 VITALS — BP 179/76 | HR 61 | Temp 98.7°F | Resp 18

## 2022-05-11 DIAGNOSIS — R059 Cough, unspecified: Secondary | ICD-10-CM

## 2022-05-11 DIAGNOSIS — J019 Acute sinusitis, unspecified: Secondary | ICD-10-CM | POA: Diagnosis not present

## 2022-05-11 DIAGNOSIS — B9689 Other specified bacterial agents as the cause of diseases classified elsewhere: Secondary | ICD-10-CM | POA: Diagnosis not present

## 2022-05-11 DIAGNOSIS — R051 Acute cough: Secondary | ICD-10-CM

## 2022-05-11 MED ORDER — AMOXICILLIN-POT CLAVULANATE 875-125 MG PO TABS: 1.0000 | ORAL_TABLET | Freq: Two times a day (BID) | ORAL | 0 refills | Status: DC

## 2022-05-11 MED ORDER — PROMETHAZINE-DM 6.25-15 MG/5ML PO SYRP: 2.5000 mL | ORAL_SOLUTION | Freq: Every evening | ORAL | 0 refills | Status: DC | PRN

## 2022-05-11 MED ORDER — BENZONATATE 100 MG PO CAPS: 100.0000 mg | ORAL_CAPSULE | Freq: Three times a day (TID) | ORAL | 0 refills | Status: DC

## 2022-05-11 NOTE — Discharge Instructions (Signed)
You have a bacterial sinus infection. Your chest x-ray looked great! Take Augmentin antibiotic twice daily for the next 7 days with food.  Take Tessalon Perles every 8 hours as needed for cough.  Take Promethazine DM cough medication to help with your cough at nighttime so that you are able to sleep. Do not drive, drink alcohol, or go to work while taking this medication since it can make you sleepy. Only take this at nighttime.   If you develop any new or worsening symptoms or do not improve in the next 2 to 3 days, please return.  If your symptoms are severe, please go to the emergency room.  Follow-up with your primary care provider for further evaluation and management of your symptoms as well as ongoing wellness visits.  I hope you feel better!

## 2022-05-11 NOTE — ED Triage Notes (Signed)
Patient reports symptoms started 2 weeks ago.  Symptoms severity has been waxing and waning until 2 days ago when they worsened significantly.  Patient has a runny nose and a stuffy nose, cough, generalized soreness.  Reports intermittent productive cough is clear , thick.  At night cough worsens .    Patient has taken theraflu

## 2022-05-11 NOTE — ED Provider Notes (Signed)
[facility]    CSN: [id number] Arrival date & time: 05/11/22  [place]      History   Chief Complaint Chief Complaint  Patient presents with   Appointment    4:30   URI    HPI [name] is a 71 y.o. male.   Patient presents urgent care for evaluation of nasal congestion, cough, fatigue, and generalized bodyaches intermittently over the last 2 weeks that worsened over the last 2 days.  Cough is productive with clear phlegm and worse at nighttime.  Nasal congestion significantly worsened yesterday as well and is purulent/yellow.  States his wife and grandchildren are sick at home with similar symptoms.  He denies chest pain, shortness of breath, heart palpitations, nausea, vomiting, dizziness, chills, fever, diarrhea, abdominal pain, and urinary symptoms.  He denies headache, vision changes, and one-sided weakness.  He has been using TheraFlu for symptoms without relief.  Significant history of cigarette smoking for many years, quit 7 years ago.  Denies drug use.  He denies history of chronic respiratory problems.    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   MI (myocardial infarction) ([place]) 2011   Peptic ulcer disease    Pneumonia    "when I was younger"   Sinus bradycardia 09/28/2011   Stroke [facility])    SVT (supraventricular tachycardia)    TIA (transient ischemic attack)    Tubular adenoma of colon    Walking pneumonia ~ 2009    Patient Active Problem List   Diagnosis Date Noted   Chronic low back pain 02/15/2021   Malnutrition of moderate degree 02/14/2017   Right sided weakness 02/13/2017   Bradycardia 02/13/2017   Chest pain 08/19/2016   Chest pain at rest 06/01/2013   Rhinosinusitis 02/24/2013   Abrasion of cornea, right  07/18/2012   Cough 07/01/2012   Acute coronary syndrome ([place]) 09/28/2011   MI (myocardial infarction) ([place])    HIP PAIN, RIGHT 06/09/2010   TOBACCO USER 02/20/2009   Hyperlipidemia with target low density lipoprotein (LDL) cholesterol less than 70 mg/dL 01/12/2009   SUBSTANCE ABUSE, MULTIPLE 01/12/2009   HYPERTENSION 01/12/2009   SUPRAVENTRICULAR TACHYCARDIA 01/12/2009   GERD 01/12/2009   Osteoarthritis 01/12/2009   TOBACCO ABUSE, HX OF 01/12/2009   HYPERTENSION, BENIGN ESSENTIAL 07/21/2008   Coronary atherosclerosis 07/12/2006   GASTRIC ULCER ACUTE WITH HEMORRHAGE 07/12/2006    Past Surgical History:  Procedure Laterality Date   CARDIAC CATHETERIZATION     COLECTOMY  1990's   "ulcer burst; took out part of my colon"   CORONARY ANGIOPLASTY WITH STENT PLACEMENT  08/2006   "1" cypher drug eluting stent ok for up to [place] N/A 09/29/2011   Procedure: [place];  Surgeon: [name], MD;  Location: [facility] CATH LAB;  Service: Cardiovascular;  Laterality: N/A;       Home Medications    Prior to Admission medications   Medication Sig Start Date End Date Taking? Authorizing Provider  aspirin EC 81 MG tablet Take 81 mg by mouth daily. Swallow whole.   Yes [provider]  benzonatate (TESSALON) 100 MG capsule Take 1 capsule (  100 mg total) by mouth every 8 (eight) hours. 05/11/22  Yes Talbot Grumbling, FNP  promethazine-dextromethorphan (PROMETHAZINE-DM) 6.25-15 MG/5ML syrup Take 2.5 mLs by mouth at bedtime as needed for cough. 05/11/22  Yes Talbot Grumbling, FNP  amLODipine (NORVASC) 5 MG tablet Take 5 mg by mouth daily.    [provider]  atorvastatin (LIPITOR) 40 MG tablet Take 1 tablet (40 mg total) by mouth daily. 02/16/17 11/25/18  Doreatha Lew, MD  clopidogrel (PLAVIX) 75 MG tablet Take 1 tablet (75 mg total) by mouth daily. 02/17/17   Doreatha Lew,  MD  gabapentin (NEURONTIN) 100 MG capsule Take 1 capsule (100 mg total) by mouth 3 (three) times daily. Start 1 capsule twice daily x 1 week and then three times daily 11/25/18 11/25/19  Garvin Fila, MD  losartan (COZAAR) 25 MG tablet  10/11/18   [provider]  predniSONE (DELTASONE) 10 MG tablet Take 2 tablets (20 mg total) by mouth daily. Patient not taking: Reported on 05/11/2022 01/05/21   Sherrill Raring, PA-C    Family History Family History  Problem Relation Age of Onset   Colon cancer Father    Rectal cancer Neg Hx    Stomach cancer Neg Hx    Esophageal cancer Neg Hx     Social History Social History   Tobacco Use   Smoking status: Former    Packs/day: 0.50    Years: 52.00    Total pack years: 26.00    Types: Cigarettes    Quit date: 02/26/2017    Years since quitting: 5.2   Smokeless tobacco: Never  Vaping Use   Vaping Use: Never used  Substance Use Topics   Alcohol use: Yes    Alcohol/week: 12.0 standard drinks of alcohol    Types: 12 Cans of beer per week   Drug use: Yes    Types: Marijuana, Cocaine    Comment: ; cocaine ~ 2001", still smoke marijuana for pain and leisure     Allergies   Patient has no known allergies.   Review of Systems Review of Systems Per HPI  Physical Exam Triage Vital Signs ED Triage Vitals  Enc Vitals Group     BP 05/11/22 1655 (!) 179/76     Pulse Rate 05/11/22 1655 61     Resp 05/11/22 1655 18     Temp 05/11/22 1655 98.7 F (37.1 C)     Temp Source 05/11/22 1655 Oral     SpO2 05/11/22 1655 98 %     Weight --      Height --      Head Circumference --      Peak Flow --      Pain Score 05/11/22 1653 0     Pain Loc --      Pain Edu? --      Excl. in [place]? --    No data found.  Updated Vital Signs BP (!) 179/76 (BP Location: Left Arm)   Pulse 61   Temp 98.7 F (37.1 C) (Oral)   Resp 18   SpO2 98%   Visual Acuity Right Eye Distance:   Left Eye Distance:   Bilateral Distance:    Right Eye Near:    Left Eye Near:    Bilateral Near:     Physical Exam Vitals and nursing note reviewed.  Constitutional:      Appearance: He is not ill-appearing or toxic-appearing.  HENT:     Head: Normocephalic and atraumatic.  Right Ear: Hearing, tympanic membrane, ear canal and external ear normal.     Left Ear: Hearing, tympanic membrane, ear canal and external ear normal.     Nose: Congestion present.     Comments: Bilateral maxillary sinus tenderness to palpation.    Mouth/Throat:     Lips: Pink.     Mouth: Mucous membranes are moist.     Pharynx: Posterior oropharyngeal erythema present.  Eyes:     General: Lids are normal. Vision grossly intact. Gaze aligned appropriately.        Right eye: No discharge.        Left eye: No discharge.     Extraocular Movements: Extraocular movements intact.     Conjunctiva/sclera: Conjunctivae normal.  Cardiovascular:     Rate and Rhythm: Normal rate and regular rhythm.     Heart sounds: Normal heart sounds, S1 normal and S2 normal.  Pulmonary:     Effort: Pulmonary effort is normal. No respiratory distress.     Breath sounds: Normal breath sounds and air entry.  Abdominal:     General: Abdomen is flat. Bowel sounds are normal.     Palpations: Abdomen is soft.     Tenderness: There is no abdominal tenderness.  Musculoskeletal:     Cervical back: Neck supple.     Right lower leg: No edema.     Left lower leg: No edema.  Lymphadenopathy:     Cervical: Cervical adenopathy present.  Skin:    General: Skin is warm and dry.     Capillary Refill: Capillary refill takes less than 2 seconds.     Findings: No rash.  Neurological:     General: No focal deficit present.     Mental Status: He is alert and oriented to person, place, and time. Mental status is at baseline.     Cranial Nerves: No dysarthria or facial asymmetry.  Psychiatric:        Mood and Affect: Mood normal.        Speech: Speech normal.        Behavior: Behavior normal.         Thought Content: Thought content normal.        Judgment: Judgment normal.      UC Treatments / Results  Labs (all labs ordered are listed, but only abnormal results are displayed) Labs Reviewed - No data to display  EKG   Radiology DG Chest 2 View  Result Date: 05/11/2022 CLINICAL DATA:  persistent cough EXAM: CHEST - 2 VIEW COMPARISON:  08/19/2016 FINDINGS: Cardiac silhouette is unremarkable. No pneumothorax or pleural effusion. The lungs are clear. The visualized skeletal structures are unremarkable. IMPRESSION: No acute cardiopulmonary process. Electronically Signed   By: Sammie Bench M.D.   On: 05/11/2022 17:41    Procedures Procedures (including critical care time)  Medications Ordered in UC Medications - No data to display  Initial Impression / Assessment and Plan / UC Course  I have reviewed the triage vital signs and the nursing notes.  Pertinent labs & imaging results that were available during my care of the patient were reviewed by me and considered in my medical decision making (see chart for details).   1.  Acute bacterial sinusitis, acute cough Presentation is consistent with acute bacterial sinusitis infection due to length of illness and symptoms.  Symptoms have not responded well to over-the-counter treatment and have been persistent for over 10 days, therefore we will prescribe antibiotics for treatment.  Patient to take Augmentin  antibiotic twice daily for the next 7 days.  He may use Tessalon Perles every 8 hours as needed for cough.  He may use Promethazine DM at bedtime as needed for cough, drowsiness precautions discussed.  Chest x-ray is clear and without cardiopulmonary/musculoskeletal abnormality.  This is very reassuring.  Deferred viral testing based on timing of illness.  Patient verbalizes agreement with plan.  Discussed physical exam and available lab work findings in clinic with patient.  Counseled patient regarding appropriate use of medications  and potential side effects for all medications recommended or prescribed today. Discussed red flag signs and symptoms of worsening condition,when to call the PCP office, return to urgent care, and when to seek higher level of care in the emergency department. Patient verbalizes understanding and agreement with plan. All questions answered. Patient discharged in stable condition.    Final Clinical Impressions(s) / UC Diagnoses   Final diagnoses:  Acute bacterial sinusitis  Acute cough     Discharge Instructions      You have a bacterial sinus infection. Your chest x-ray looked great! Take Augmentin antibiotic twice daily for the next 7 days with food.  Take Tessalon Perles every 8 hours as needed for cough.  Take Promethazine DM cough medication to help with your cough at nighttime so that you are able to sleep. Do not drive, drink alcohol, or go to work while taking this medication since it can make you sleepy. Only take this at nighttime.   If you develop any new or worsening symptoms or do not improve in the next 2 to 3 days, please return.  If your symptoms are severe, please go to the emergency room.  Follow-up with your primary care provider for further evaluation and management of your symptoms as well as ongoing wellness visits.  I hope you feel better!     ED Prescriptions     Medication Sig Dispense Auth. Provider   benzonatate (TESSALON) 100 MG capsule Take 1 capsule (100 mg total) by mouth every 8 (eight) hours. 21 capsule Talbot Grumbling, FNP   promethazine-dextromethorphan (PROMETHAZINE-DM) 6.25-15 MG/5ML syrup Take 2.5 mLs by mouth at bedtime as needed for cough. 118 mL Talbot Grumbling, FNP      PDMP not reviewed this encounter.   Talbot Grumbling, [place] 05/11/22 1757

## 2022-07-24 DIAGNOSIS — H579 Unspecified disorder of eye and adnexa: Secondary | ICD-10-CM | POA: Diagnosis not present

## 2022-08-02 ENCOUNTER — Telehealth: Payer: Self-pay | Admitting: Family Medicine

## 2022-08-02 NOTE — Telephone Encounter (Signed)
Contacted [name] to schedule their annual wellness visit. Call back at later date: 08/04/2022  Patient wasn't home.  Thank you,  [place] Direct dial  [phone]

## 2022-08-04 ENCOUNTER — Telehealth: Payer: Self-pay | Admitting: Family Medicine

## 2022-08-04 NOTE — Telephone Encounter (Signed)
Called patient to schedule Medicare Annual Wellness Visit (AWV). Left message for patient to call back and schedule Medicare Annual Wellness Visit (AWV).  Last date of AWV: AWVI eligible as of 08/14/2018  Please schedule an AWVI appointment at any time with [place].  If any questions, please contact me at [phone].    Thank you,  [employer] Direct dial  [phone]

## 2022-10-12 ENCOUNTER — Ambulatory Visit (INDEPENDENT_AMBULATORY_CARE_PROVIDER_SITE_OTHER): Payer: 59 | Admitting: Family Medicine

## 2022-10-12 DIAGNOSIS — Z91199 Patient's noncompliance with other medical treatment and regimen due to unspecified reason: Secondary | ICD-10-CM

## 2022-10-13 NOTE — Progress Notes (Signed)
   Complete physical exam  Patient: [name]   DOB: 03/04/1997   72 y.o. Male  MRN: [id number]  Subjective:    No chief complaint on file.   [name] is a 72 y.o. male who presents today for a complete physical exam. She reports consuming a {diet types:17450} diet. {types:19826} She generally feels {DESC; WELL/FAIRLY WELL/POORLY:18703}. She reports sleeping {DESC; WELL/FAIRLY WELL/POORLY:18703}. She {does/does not:200015} have additional problems to discuss today.    Most recent fall risk assessment:    11/09/2021   10:42 AM  Fall Risk   Falls in the past year? 0  Number falls in past yr: 0  Injury with Fall? 0  Risk for fall due to : No Fall Risks  Follow up Falls evaluation completed     Most recent depression screenings:    11/09/2021   10:42 AM 09/30/2020   10:46 AM  PHQ 2/9 Scores  PHQ - 2 Score 0 0  PHQ- 9 Score 5     {VISON DENTAL STD PSA (Optional):27386}  {History (Optional):23778}  Patient Care Team: Jessup, Joy, NP as PCP - General (Nurse Practitioner)   Outpatient Medications Prior to Visit  Medication Sig   fluticasone (FLONASE) 50 MCG/ACT nasal spray Place 2 sprays into both nostrils in the morning and at bedtime. After 7 days, reduce to once daily.   norgestimate-ethinyl estradiol (SPRINTEC 28) 0.25-35 MG-MCG tablet Take 1 tablet by mouth daily.   Nystatin POWD Apply liberally to affected area 2 times per day   spironolactone (ALDACTONE) 100 MG tablet Take 1 tablet (100 mg total) by mouth daily.   No facility-administered medications prior to visit.    ROS        Objective:     There were no vitals taken for this visit. {Vitals History (Optional):23777}  Physical Exam   No results found for any visits on 12/15/21. {Show previous labs (optional):23779}    Assessment & Plan:    Routine Health Maintenance and Physical Exam  Immunization History  Administered Date(s) Administered   DTaP 05/18/1999, 07/14/1999,  09/22/1999, 06/07/2000, 12/22/2003   Hepatitis A 10/18/2007, 10/23/2008   Hepatitis B 03/05/1999, 04/12/1999, 09/22/1999   HiB (PRP-OMP) 05/18/1999, 07/14/1999, 09/22/1999, 06/07/2000   IPV 05/18/1999, 07/14/1999, 03/12/2000, 12/22/2003   Influenza,inj,Quad PF,6+ Mos 01/23/2014   Influenza-Unspecified 04/24/2012   MMR 03/12/2001, 12/22/2003   Meningococcal Polysaccharide 10/23/2011   Pneumococcal Conjugate-13 06/07/2000   Pneumococcal-Unspecified 09/22/1999, 12/06/1999   Tdap 10/23/2011   Varicella 03/12/2000, 10/18/2007    Health Maintenance  Topic Date Due   HIV Screening  Never done   Hepatitis C Screening  Never done   INFLUENZA VACCINE  12/13/2021   PAP-Cervical Cytology Screening  12/15/2021 (Originally 03/03/2020)   PAP SMEAR-Modifier  12/15/2021 (Originally 03/03/2020)   TETANUS/TDAP  12/15/2021 (Originally 10/22/2021)   HPV VACCINES  Discontinued   COVID-19 Vaccine  Discontinued    Discussed health benefits of physical activity, and encouraged her to engage in regular exercise appropriate for her age and condition.  Problem List Items Addressed This Visit   None Visit Diagnoses     Annual physical exam    -  Primary   Cervical cancer screening       Need for Tdap vaccination          No follow-ups on file.     Joy Jessup, NP   

## 2022-10-16 ENCOUNTER — Encounter: Payer: Self-pay | Admitting: Family Medicine

## 2022-10-16 ENCOUNTER — Ambulatory Visit (INDEPENDENT_AMBULATORY_CARE_PROVIDER_SITE_OTHER): Payer: 59 | Admitting: Family Medicine

## 2022-10-16 VITALS — BP 185/77 | HR 47 | Ht 66.0 in | Wt 149.4 lb

## 2022-10-16 DIAGNOSIS — M25562 Pain in left knee: Secondary | ICD-10-CM

## 2022-10-16 DIAGNOSIS — M545 Low back pain, unspecified: Secondary | ICD-10-CM

## 2022-10-16 DIAGNOSIS — Z0000 Encounter for general adult medical examination without abnormal findings: Secondary | ICD-10-CM | POA: Diagnosis not present

## 2022-10-16 DIAGNOSIS — I10 Essential (primary) hypertension: Secondary | ICD-10-CM

## 2022-10-16 DIAGNOSIS — G8929 Other chronic pain: Secondary | ICD-10-CM | POA: Diagnosis not present

## 2022-10-16 DIAGNOSIS — I2510 Atherosclerotic heart disease of native coronary artery without angina pectoris: Secondary | ICD-10-CM

## 2022-10-16 MED ORDER — DICLOFENAC SODIUM 1 % EX GEL: 4.0000 g | Freq: Four times a day (QID) | CUTANEOUS | 0 refills | Status: DC

## 2022-10-16 NOTE — Patient Instructions (Addendum)
It was nice seeing you today!  Think about getting the shingles vaccine, pneumonia vaccine, and the lung cancer screening CT scan.  Let me know if you change your mind about any of these.  Tdap and shingles you can get at your drug store.  Try Voltaren gel 4 times a day on the knee. You can continue taking Tylenol with this.  If knee is not getting better in the next few weeks, come back and see [place].  Stay well, Littie Deeds, MD [facility] [phone]  --  Make sure to check out at the front desk before you leave today.  Please arrive at least 15 minutes prior to your scheduled appointments.  If you had blood work today, I will send you a MyChart message or a letter if results are normal. Otherwise, I will give you a call.  If you had a referral placed, they will call you to set up an appointment. Please give [place] a call if you don't hear back in the next 2 weeks.  If you need additional refills before your next appointment, please call your pharmacy first.

## 2022-10-16 NOTE — Progress Notes (Signed)
SUBJECTIVE:   CHIEF COMPLAINT / HPI:  Chief Complaint  [name] presents with   Annual Exam    [name]'s cardiologist Dr. Sharyn Lull wanted him to have labs done: lipid panel, hepatic function panel, and BMP. Next visit is due soon, he will make appointment after his labs come back.  [name] is a former smoker, quit smoking about 7 years ago.  Started at age 72 and reports smoking 1 pack/day for about 50 years. Reports drinking on average 3 beers per day. Exercises 2 hours daily with walking and doing yard work such as cutting grass.  [name] has had lower back pain for several years.  He has tried physical therapy in the past but did not think it was helpful.  He declines new referral for physical therapy.  [name] has also had left knee pain for the past few days.  He reports that he was doing work in the yard and felt a pop in his left knee as he was standing up.  He has been able to ambulate.  He is taking some Tylenol for pain.  Denies any catching or locking of the joint.  Lives with wife, mother-in-law, daughter, and grandson.  PERTINENT  PMH / PSH: HTN, CAD, GERD, HLD, tobacco abuse, SVT  [name] Care Team: Evette Georges, MD as PCP - General (Family Medicine)   OBJECTIVE:   BP (!) 185/77   Pulse (!) 47   Ht 5\' 6"  (1.676 m)   Wt 149 lb 6 oz (67.8 kg)   BMI 24.11 kg/m   Physical Exam Constitutional:      General: He is not in acute distress. HENT:     Head: Normocephalic and atraumatic.  Cardiovascular:     Rate and Rhythm: Regular rhythm. Bradycardia present.  Pulmonary:     Effort: Pulmonary effort is normal. No respiratory distress.     Breath sounds: Normal breath sounds.  Musculoskeletal:     Comments: Left knee: No obvious swelling or deformity.  Tenderness to palpation along the medial joint line and posterior knee.  Full strength of resisted flexion and extension.  Negative varus/valgus stressing.  Negative anterior drawer, negative posterior drawer.  There  is some pain elicited with [facility] testing.  Neurological:     Mental Status: He is alert.         10/16/2022    9:46 AM  Depression screen PHQ 2/9  Decreased Interest 0  Down, Depressed, Hopeless 0  PHQ - 2 Score 0  Altered sleeping 0  Tired, decreased energy 0  Change in appetite 0  Feeling bad or failure about yourself  0  Trouble concentrating 0  Moving slowly or fidgety/restless 0  Suicidal thoughts 0  PHQ-9 Score 0     {Show previous vital signs (optional):23777}    ASSESSMENT/PLAN:   1. Encounter for routine history and physical examination of adult - lung cancer screening discussed, [name] declines at this time - pneumonia vaccination declined - shingles vaccination declined - Tdap declined  2. HYPERTENSION, BENIGN ESSENTIAL Uncontrolled, presently managed by his cardiologist.  No medication changes today. - Basic Metabolic Panel  3. Coronary artery disease involving native coronary artery of native heart without angina pectoris Obtain labs desired by his cardiologist. - Basic Metabolic Panel - Hepatic Function Panel - Lipid Panel  4. Acute pain of left knee Few days of left knee secondary to injury while standing up/twisting.  Could consider meniscal tear given mechanism of injury.  Will treat conservatively for now with  topical NSAIDs, he may continue acetaminophen. - diclofenac Sodium (VOLTAREN) 1 % GEL; Apply 4 g topically 4 (four) times daily.  Dispense: 350 g; Refill: 0 - advised to return if not improving  5. Chronic bilateral low back pain, unspecified whether sciatica present Chronic problem.  Offered PT referral, [name] declined.  Return in about 1 year (around 10/16/2023) for physical.   Littie Deeds, MD [facility]

## 2022-10-17 ENCOUNTER — Encounter: Payer: Self-pay | Admitting: Family Medicine

## 2022-10-17 LAB — BASIC METABOLIC PANEL
BUN/Creatinine Ratio: 10 (ref 10–24)
BUN: 13 mg/dL (ref 8–27)
CO2: 22 mmol/L (ref 20–29)
Calcium: 9.3 mg/dL (ref 8.6–10.2)
Chloride: 108 mmol/L — ABNORMAL HIGH (ref 96–106)
Creatinine, Ser: 1.27 mg/dL (ref 0.76–1.27)
Glucose: 82 mg/dL (ref 70–99)
Potassium: 4.5 mmol/L (ref 3.5–5.2)
Sodium: 141 mmol/L (ref 134–144)
eGFR: 61 mL/min/{1.73_m2} (ref >59)

## 2022-10-17 LAB — HEPATIC FUNCTION PANEL
ALT: 12 IU/L (ref 0–44)
AST: 18 IU/L (ref 0–40)
Albumin: 4.2 g/dL (ref 3.9–4.9)
Alkaline Phosphatase: 42 IU/L — ABNORMAL LOW (ref 44–121)
Bilirubin Total: 0.4 mg/dL (ref 0.0–1.2)
Bilirubin, Direct: 0.13 mg/dL (ref 0.00–0.40)
Total Protein: 6.5 g/dL (ref 6.0–8.5)

## 2022-10-17 LAB — LIPID PANEL
Chol/HDL Ratio: 2.1 ratio (ref 0.0–5.0)
Cholesterol, Total: 192 mg/dL (ref 100–199)
HDL: 91 mg/dL (ref >39)
LDL Chol Calc (NIH): 90 mg/dL (ref 0–99)
Triglycerides: 56 mg/dL (ref 0–149)
VLDL Cholesterol Cal: 11 mg/dL (ref 5–40)

## 2022-11-02 NOTE — Addendum Note (Signed)
Addended by: Evette Georges B on: 11/02/2022 02:01 PM   Modules accepted: Level of Service

## 2022-12-29 ENCOUNTER — Ambulatory Visit: Payer: 59

## 2022-12-29 VITALS — Ht 66.0 in | Wt 149.0 lb

## 2022-12-29 DIAGNOSIS — Z0000 Encounter for general adult medical examination without abnormal findings: Secondary | ICD-10-CM | POA: Diagnosis not present

## 2022-12-29 NOTE — Patient Instructions (Signed)
[name] , Thank you for taking time to come for your Medicare Wellness Visit. I appreciate your ongoing commitment to your health goals. Please review the following plan we discussed and let me know if I can assist you in the future.   Referrals/Orders/Follow-Ups/Clinician Recommendations: Aim for 30 minutes of exercise or brisk walking, 6-8 glasses of water, and 5 servings of fruits and vegetables each day.  This is a list of the screening recommended for you and due dates:  Health Maintenance  Topic Date Due   DTaP/Tdap/Td vaccine (1 - Tdap) Never done   Screening for Lung Cancer  Never done   Zoster (Shingles) Vaccine (1 of 2) Never done   Pneumonia Vaccine (2 of 2 - PCV) 02/15/2018   Colon Cancer Screening  11/17/2021   COVID-19 Vaccine (1 - 2023-24 season) Never done   Flu Shot  12/14/2022   Lipid (cholesterol) test  04/17/2023   Medicare Annual Wellness Visit  12/29/2023   Hepatitis C Screening  Completed   HPV Vaccine  Aged Out    Advanced directives: (ACP Link)Information on Advanced Care Planning can be found at [facility] of [place] Advance Health Care Directives Advance Health Care Directives ([email or web address]   Next Medicare Annual Wellness Visit scheduled for next year: Yes  Preventive Care 65 Years and Older, Male  Preventive care refers to lifestyle choices and visits with your health care provider that can promote health and wellness. What does preventive care include? A yearly physical exam. This is also called an annual well check. Dental exams once or twice a year. Routine eye exams. Ask your health care provider how often you should have your eyes checked. Personal lifestyle choices, including: Daily care of your teeth and gums. Regular physical activity. Eating a healthy diet. Avoiding tobacco and drug use. Limiting alcohol use. Practicing safe sex. Taking low doses of aspirin every day. Taking vitamin and mineral supplements as recommended by your  health care provider. What happens during an annual well check? The services and screenings done by your health care provider during your annual well check will depend on your age, overall health, lifestyle risk factors, and family history of disease. Counseling  Your health care provider may ask you questions about your: Alcohol use. Tobacco use. Drug use. Emotional well-being. Home and relationship well-being. Sexual activity. Eating habits. History of falls. Memory and ability to understand (cognition). Work and work Astronomer. Screening  You may have the following tests or measurements: Height, weight, and BMI. Blood pressure. Lipid and cholesterol levels. These may be checked every 5 years, or more frequently if you are over 46 years old. Skin check. Lung cancer screening. You may have this screening every year starting at age 40 if you have a 30-pack-year history of smoking and currently smoke or have quit within the past 15 years. Fecal occult blood test (FOBT) of the stool. You may have this test every year starting at age 56. Flexible sigmoidoscopy or colonoscopy. You may have a sigmoidoscopy every 5 years or a colonoscopy every 10 years starting at age 13. Prostate cancer screening. Recommendations will vary depending on your family history and other risks. Hepatitis C blood test. Hepatitis B blood test. Sexually transmitted disease (STD) testing. Diabetes screening. This is done by checking your blood sugar (glucose) after you have not eaten for a while (fasting). You may have this done every 1-3 years. Abdominal aortic aneurysm (AAA) screening. You may need this if you are a current or former  smoker. Osteoporosis. You may be screened starting at age 63 if you are at high risk. Talk with your health care provider about your test results, treatment options, and if necessary, the need for more tests. Vaccines  Your health care provider may recommend certain vaccines, such  as: Influenza vaccine. This is recommended every year. Tetanus, diphtheria, and acellular pertussis (Tdap, Td) vaccine. You may need a Td booster every 10 years. Zoster vaccine. You may need this after age 43. Pneumococcal 13-valent conjugate (PCV13) vaccine. One dose is recommended after age 77. Pneumococcal polysaccharide (PPSV23) vaccine. One dose is recommended after age 28. Talk to your health care provider about which screenings and vaccines you need and how often you need them. This information is not intended to replace advice given to you by your health care provider. Make sure you discuss any questions you have with your health care provider. Document Released: 05/28/2015 Document Revised: 01/19/2016 Document Reviewed: 03/02/2015 Elsevier Interactive Patient Education  2017 [employer].  Fall Prevention in the Home Falls can cause injuries. They can happen to people of all ages. There are many things you can do to make your home safe and to help prevent falls. What can I do on the outside of my home? Regularly fix the edges of walkways and driveways and fix any cracks. Remove anything that might make you trip as you walk through a door, such as a raised step or threshold. Trim any bushes or trees on the path to your home. Use bright outdoor lighting. Clear any walking paths of anything that might make someone trip, such as rocks or tools. Regularly check to see if handrails are loose or broken. Make sure that both sides of any steps have handrails. Any raised decks and porches should have guardrails on the edges. Have any leaves, snow, or ice cleared regularly. Use sand or salt on walking paths during winter. Clean up any spills in your garage right away. This includes oil or grease spills. What can I do in the bathroom? Use night lights. Install grab bars by the toilet and in the tub and shower. Do not use towel bars as grab bars. Use non-skid mats or decals in the tub or  shower. If you need to sit down in the shower, use a plastic, non-slip stool. Keep the floor dry. Clean up any water that spills on the floor as soon as it happens. Remove soap buildup in the tub or shower regularly. Attach bath mats securely with double-sided non-slip rug tape. Do not have throw rugs and other things on the floor that can make you trip. What can I do in the bedroom? Use night lights. Make sure that you have a light by your bed that is easy to reach. Do not use any sheets or blankets that are too big for your bed. They should not hang down onto the floor. Have a firm chair that has side arms. You can use this for support while you get dressed. Do not have throw rugs and other things on the floor that can make you trip. What can I do in the kitchen? Clean up any spills right away. Avoid walking on wet floors. Keep items that you use a lot in easy-to-reach places. If you need to reach something above you, use a strong step stool that has a grab bar. Keep electrical cords out of the way. Do not use floor polish or wax that makes floors slippery. If you must use wax, use non-skid  floor wax. Do not have throw rugs and other things on the floor that can make you trip. What can I do with my stairs? Do not leave any items on the stairs. Make sure that there are handrails on both sides of the stairs and use them. Fix handrails that are broken or loose. Make sure that handrails are as long as the stairways. Check any carpeting to make sure that it is firmly attached to the stairs. Fix any carpet that is loose or worn. Avoid having throw rugs at the top or bottom of the stairs. If you do have throw rugs, attach them to the floor with carpet tape. Make sure that you have a light switch at the top of the stairs and the bottom of the stairs. If you do not have them, ask someone to add them for you. What else can I do to help prevent falls? Wear shoes that: Do not have high heels. Have  rubber bottoms. Are comfortable and fit you well. Are closed at the toe. Do not wear sandals. If you use a stepladder: Make sure that it is fully opened. Do not climb a closed stepladder. Make sure that both sides of the stepladder are locked into place. Ask someone to hold it for you, if possible. Clearly mark and make sure that you can see: Any grab bars or handrails. First and last steps. Where the edge of each step is. Use tools that help you move around (mobility aids) if they are needed. These include: Canes. Walkers. Scooters. Crutches. Turn on the lights when you go into a dark area. Replace any light bulbs as soon as they burn out. Set up your furniture so you have a clear path. Avoid moving your furniture around. If any of your floors are uneven, fix them. If there are any pets around you, be aware of where they are. Review your medicines with your doctor. Some medicines can make you feel dizzy. This can increase your chance of falling. Ask your doctor what other things that you can do to help prevent falls. This information is not intended to replace advice given to you by your health care provider. Make sure you discuss any questions you have with your health care provider. Document Released: 02/25/2009 Document Revised: 10/07/2015 Document Reviewed: 06/05/2014 Elsevier Interactive Patient Education  2017 [employer].

## 2022-12-29 NOTE — Progress Notes (Addendum)
Subjective:   [name] is a 72 y.o. male who presents for an Initial Medicare Annual Wellness Visit.  Visit Complete: Virtual  I connected with  [name] on 12/29/22 by a audio enabled telemedicine application and verified that I am speaking with the correct person using two identifiers.  Patient Location: Home  Provider Location: Home Office  I discussed the limitations of evaluation and management by telemedicine. The patient expressed understanding and agreed to proceed.  Vital Signs: Because this visit was a virtual/telehealth visit, some criteria may be missing or patient reported. Any vitals not documented were not able to be obtained and vitals that have been documented are patient reported.   Review of Systems     Cardiac Risk Factors include: advanced age (>48men, >53 women);dyslipidemia;hypertension;male gender     Objective:    Today's Vitals   12/29/22 0841  Weight: 149 lb (67.6 kg)  Height: 5\' 6"  (1.676 m)  PainSc: 4    Body mass index is 24.05 kg/m.     12/29/2022    8:45 AM 02/15/2021   10:37 AM 01/05/2021    2:32 PM 11/18/2018    9:59 AM 02/13/2017    9:45 AM 08/19/2016    4:00 PM 08/19/2016    9:45 AM  Advanced Directives  Does Patient Have a Medical Advance Directive? No No No No No No No  Would patient like information on creating a medical advance directive? Yes (MAU/Ambulatory/Procedural Areas - Information given) No - Patient declined  No - Patient declined No - Patient declined No - Patient declined     Current Medications (verified) Outpatient Encounter Medications as of 12/29/2022  Medication Sig   amLODipine (NORVASC) 5 MG tablet Take 5 mg by mouth daily.   aspirin EC 81 MG tablet Take 81 mg by mouth daily. Swallow whole.   atorvastatin (LIPITOR) 40 MG tablet Take 1 tablet (40 mg total) by mouth daily.   clopidogrel (PLAVIX) 75 MG tablet Take 1 tablet (75 mg total) by mouth daily.   diclofenac Sodium (VOLTAREN) 1 % GEL Apply 4 g topically  4 (four) times daily.   No facility-administered encounter medications on file as of 12/29/2022.    Allergies (verified) Patient has no known allergies.   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   MI (myocardial infarction) (HCC) 2011   Peptic ulcer disease    Pneumonia    "when I was younger"   Sinus bradycardia 09/28/2011   Stroke (HCC)    SVT (supraventricular tachycardia)    TIA (transient ischemic attack)    Tubular adenoma of colon    Walking pneumonia ~ 2009   Past Surgical History:  Procedure Laterality Date   CARDIAC CATHETERIZATION     COLECTOMY  1990's   "ulcer burst; took out part of my colon"   CORONARY ANGIOPLASTY WITH STENT PLACEMENT  08/2006   "1" cypher drug eluting stent ok for up to 3T    LEFT HEART CATHETERIZATION WITH CORONARY ANGIOGRAM N/A 09/29/2011   Procedure: LEFT HEART CATHETERIZATION WITH CORONARY ANGIOGRAM;  Surgeon: Robynn Pane, MD;  Location: [facility] CATH LAB;  Service: Cardiovascular;  Laterality: N/A;   Family History  Problem Relation  Age of Onset   Colon cancer Father    Rectal cancer Neg Hx    Stomach cancer Neg Hx    Esophageal cancer Neg Hx    Social History   Socioeconomic History   Marital status: Single    Spouse name: Not on file   Number of children: 14   Years of education: Not on file   Highest education level: Not on file  Occupational History   Not on file  Tobacco Use   Smoking status: Former    Current packs/day: 0.00    Average packs/day: 0.5 packs/day for 52.0 years (26.0 ttl pk-yrs)    Types: Cigarettes    Start date: 02/26/1965    Quit date: 02/26/2017    Years since quitting: 5.8   Smokeless tobacco: Never  Vaping Use   Vaping status: Never Used  Substance and Sexual Activity    Alcohol use: Yes    Alcohol/week: 12.0 standard drinks of alcohol    Types: 12 Cans of beer per week   Drug use: Yes    Types: Marijuana, Cocaine    Comment: ; cocaine ~ 2001", still smoke marijuana for pain and leisure   Sexual activity: Yes  Other Topics Concern   Not on file  Social History Narrative   Not on file   Social Determinants of Health   Financial Resource Strain: Low Risk  (12/29/2022)   Overall Financial Resource Strain (CARDIA)    Difficulty of Paying Living Expenses: Not hard at all  Food Insecurity: No Food Insecurity (12/29/2022)   Hunger Vital Sign    Worried About Running Out of Food in the Last Year: Never true    Ran Out of Food in the Last Year: Never true  Transportation Needs: No Transportation Needs (12/29/2022)   PRAPARE - Administrator, Civil Service (Medical): No    Lack of Transportation (Non-Medical): No  Physical Activity: Insufficiently Active (12/29/2022)   Exercise Vital Sign    Days of Exercise per Week: 3 days    Minutes of Exercise per Session: 30 min  Stress: No Stress Concern Present (12/29/2022)   [employer] of Occupational Health - Occupational Stress Questionnaire    Feeling of Stress : Not at all  Social Connections: Moderately Integrated (12/29/2022)   Social Connection and Isolation Panel [NHANES]    Frequency of Communication with Friends and Family: More than three times a week    Frequency of Social Gatherings with Friends and Family: Three times a week    Attends Religious Services: 1 to 4 times per year    Active Member of Clubs or Organizations: No    Attends Engineer, structural: Never    Marital Status: Living with partner    Tobacco Counseling Counseling given: Not Answered   Clinical Intake:  Pre-visit preparation completed: Yes  Pain : 0-10 Pain Score: 4  Pain Location: Back     Diabetes: No  How often do you need to have someone help you when you read instructions, pamphlets,  or other written materials from your doctor or pharmacy?: 1 - Never  Interpreter Needed?: No  Information entered by :: Kandis Fantasia LPN   Activities of Daily Living    12/29/2022    8:42 AM  In your present state of health, do you have any difficulty performing the following activities:  Hearing? 0  Vision? 0  Difficulty concentrating or making decisions? 0  Walking or climbing stairs? 0  Dressing or bathing?  0  Doing errands, shopping? 0  Preparing Food and eating ? N  Using the Toilet? N  In the past six months, have you accidently leaked urine? N  Do you have problems with loss of bowel control? N  Managing your Medications? N  Managing your Finances? N  Housekeeping or managing your Housekeeping? N    Patient Care Team: Evette Georges, MD as PCP - General (Family Medicine)  Indicate any recent Medical Services you may have received from other than [facility] providers in the past year (date may be approximate).     Assessment:   This is a routine wellness examination for Finley.  Hearing/Vision screen Hearing Screening - Comments:: Denies hearing difficulties   Vision Screening - Comments:: up to date with routine eye exams with Dr. Nile Riggs     Dietary issues and exercise activities discussed:     Goals Addressed             This Visit's Progress    Remain active and independent        Depression Screen    12/29/2022    9:23 AM 10/16/2022    9:46 AM 02/15/2021   10:37 AM 02/24/2013   10:46 AM  PHQ 2/9 Scores  PHQ - 2 Score 0 0 0 0  PHQ- 9 Score  0 0     Fall Risk    12/29/2022    8:46 AM 10/16/2022    9:46 AM 02/15/2021   10:37 AM 11/25/2018    8:21 AM 02/24/2013   10:46 AM  Fall Risk   Falls in the past year? 0 0 0 0 No  Number falls in past yr: 0 0     Injury with Fall? 0 0     Risk for fall due to : No Fall Risks      Follow up Falls prevention discussed;Education provided;Falls evaluation completed        MEDICARE RISK AT HOME:  Medicare  Risk at Home - 12/29/22 1610     Any stairs in or around the home? No    If so, are there any without handrails? No    Home free of loose throw rugs in walkways, pet beds, electrical cords, etc? Yes    Adequate lighting in your home to reduce risk of falls? Yes    Life alert? No    Use of a cane, walker or w/c? No    Grab bars in the bathroom? Yes    Shower chair or bench in shower? No    Elevated toilet seat or a handicapped toilet? No             TIMED UP AND GO:  Was the test performed? No    Cognitive Function:        12/29/2022    9:22 AM  6CIT Screen  What Year? 0 points  What month? 0 points  What time? 0 points  Count back from 20 0 points  Months in reverse 2 points  Repeat phrase 2 points  Total Score 4 points    Immunizations Immunization History  Administered Date(s) Administered   Pneumococcal Polysaccharide-23 02/15/2017    TDAP status: Due, Education has been provided regarding the importance of this vaccine. Advised may receive this vaccine at local pharmacy or Health Dept. Aware to provide a copy of the vaccination record if obtained from local pharmacy or Health Dept. Verbalized acceptance and understanding.  Flu Vaccine status: Due, Education has been provided regarding the  importance of this vaccine. Advised may receive this vaccine at local pharmacy or Health Dept. Aware to provide a copy of the vaccination record if obtained from local pharmacy or Health Dept. Verbalized acceptance and understanding.  Pneumococcal vaccine status: Due, Education has been provided regarding the importance of this vaccine. Advised may receive this vaccine at local pharmacy or Health Dept. Aware to provide a copy of the vaccination record if obtained from local pharmacy or Health Dept. Verbalized acceptance and understanding.  Covid-19 vaccine status: Information provided on how to obtain vaccines.   Qualifies for Shingles Vaccine? Yes   Zostavax completed No    Shingrix Completed?: No.    Education has been provided regarding the importance of this vaccine. Patient has been advised to call insurance company to determine out of pocket expense if they have not yet received this vaccine. Advised may also receive vaccine at local pharmacy or Health Dept. Verbalized acceptance and understanding.  Screening Tests Health Maintenance  Topic Date Due   DTaP/Tdap/Td (1 - Tdap) Never done   Lung Cancer Screening  Never done   Zoster Vaccines- Shingrix (1 of 2) Never done   Pneumonia Vaccine 105+ Years old (2 of 2 - PCV) 02/15/2018   Colonoscopy  11/17/2021   COVID-19 Vaccine (1 - 2023-24 season) Never done   INFLUENZA VACCINE  12/14/2022   LIPID PANEL  04/17/2023   Medicare Annual Wellness (AWV)  12/29/2023   Hepatitis C Screening  Completed   HPV VACCINES  Aged Out    Health Maintenance  Health Maintenance Due  Topic Date Due   DTaP/Tdap/Td (1 - Tdap) Never done   Lung Cancer Screening  Never done   Zoster Vaccines- Shingrix (1 of 2) Never done   Pneumonia Vaccine 27+ Years old (2 of 2 - PCV) 02/15/2018   Colonoscopy  11/17/2021   COVID-19 Vaccine (1 - 2023-24 season) Never done   INFLUENZA VACCINE  12/14/2022    Colorectal cancer screening: Referral to GI placed and patient states that he is scheduled. Pt aware the office will call re: appt.  Lung Cancer Screening: (Low Dose CT Chest recommended if Age 81-80 years, 20 pack-year currently smoking OR have quit w/in 15years.) does qualify.   Lung Cancer Screening Referral: will discuss further with pcp  Additional Screening:  Hepatitis C Screening: does qualify; Completed 02/15/21  Vision Screening: Recommended annual ophthalmology exams for early detection of glaucoma and other disorders of the eye. Is the patient up to date with their annual eye exam?  Yes  Who is the provider or what is the name of the office in which the patient attends annual eye exams? Dr. Nile Riggs  If pt is not  established with a provider, would they like to be referred to a provider to establish care? No .   Dental Screening: Recommended annual dental exams for proper oral hygiene  Community Resource Referral / Chronic Care Management: CRR required this visit?  No   CCM required this visit?  No    Plan:     I have personally reviewed and noted the following in the patient's chart:   Medical and social history Use of alcohol, tobacco or illicit drugs  Current medications and supplements including opioid prescriptions. Patient is not currently taking opioid prescriptions. Functional ability and status Nutritional status Physical activity Advanced directives List of other physicians Hospitalizations, surgeries, and ER visits in previous 12 months Vitals Screenings to include cognitive, depression, and falls Referrals and appointments  In addition,  I have reviewed and discussed with patient certain preventive protocols, quality metrics, and best practice recommendations. A written personalized care plan for preventive services as well as general preventive health recommendations were provided to patient.     Kandis Fantasia [place], [place]   [id number]   After Visit Summary: (Mail) Due to this being a telephonic visit, the after visit summary with patients personalized plan was offered to patient via mail   Nurse Notes: Patient states that he continues to have lower back pain; would like to know what other interventions can be tried    I have reviewed this visit and agree with the documentation.

## 2023-01-22 ENCOUNTER — Encounter: Payer: Self-pay | Admitting: Family Medicine

## 2023-01-22 ENCOUNTER — Ambulatory Visit (INDEPENDENT_AMBULATORY_CARE_PROVIDER_SITE_OTHER): Payer: 59 | Admitting: Family Medicine

## 2023-01-22 ENCOUNTER — Other Ambulatory Visit: Payer: Self-pay | Admitting: Family Medicine

## 2023-01-22 VITALS — BP 170/78 | HR 58 | Ht 66.0 in | Wt 145.5 lb

## 2023-01-22 DIAGNOSIS — F17200 Nicotine dependence, unspecified, uncomplicated: Secondary | ICD-10-CM | POA: Diagnosis not present

## 2023-01-22 DIAGNOSIS — I10 Essential (primary) hypertension: Secondary | ICD-10-CM

## 2023-01-22 DIAGNOSIS — N529 Male erectile dysfunction, unspecified: Secondary | ICD-10-CM

## 2023-01-22 NOTE — Patient Instructions (Signed)
It was great to see you today! Here's what we talked about:  I think your thumb joint pain is due to arthritis. Keep using ibuprofen and tylenol. Can do 400 mg ibuprofen with 1000 mg tylenol. Only use when needed. Do not take for long periods of time. I have sent in cialis for you. Let me know and stop taking the medication if you have dizziness or blurry vision. Go up to 10 mg of your amlodipine. Also let me know and stop taking the medication if you have dizziness or blurry vision with this.  Please let me know if you have any other questions.  Dr. Phineas Real

## 2023-01-22 NOTE — Progress Notes (Signed)
    SUBJECTIVE:   CHIEF COMPLAINT / HPI:   HTN Has been taking amlodipine 5 mg daily.  Does not endorse other medications.  Has reported compliance.  CAD Reports compliance with taking Plavix, Lipitor, aspirin daily.  He does not endorse any chest pain or discomfort today.  Thumb pain His left thumb has been giving him pain whenever he flexes it.  This been happening for a week.  He has a history of osteoarthritis in multiple places of his body.  No swelling, erythema, excessive warmth of the joint.  No systemic symptoms.  Sexual dysfunction Patient reports it is more difficult to get an erection.  He spoke with his wife and would like to get a medication to help him.  He is spoken with some friends who have Cialis and have had good effects.  PERTINENT  PMH / PSH: GERD, OA, SVT, history of multiple substance abuse now abstinent, bradycardia  OBJECTIVE:   BP (!) 170/78   Pulse (!) 58   Ht 5\' 6"  (1.676 m)   Wt 145 lb 8 oz (66 kg)   SpO2 100%   BMI 23.48 kg/m   General: Alert and oriented, in NAD Skin: Warm, dry, and intact without lesions HEENT: NCAT, EOM grossly normal, midline nasal septum Cardiac: RRR, no m/r/g appreciated Respiratory: CTAB, breathing and speaking comfortably on RA Abdominal: Soft, nontender, nondistended, normoactive bowel sounds Extremities: Moves all extremities grossly equally Neurological: No gross focal deficit Psychiatric: Appropriate mood and affect   ASSESSMENT/PLAN:   Erectile dysfunction Likely due to vascular dysfunction and age.  Given patient's BP today, feel he can tolerate potential hypotensive effects of Cialis.  Will send in low-dose.  Advised not to use this concurrently with nitroglycerin.  Will see him back in 2 weeks to assess improvement.  HYPERTENSION, BENIGN ESSENTIAL Uncontrolled.  Appears he has been prescribed an losartan and HCTZ for blood pressure, there is difficult for me to see records of this.  He has amlodipine on the  list I can see. Called patient after appointment, and he confirmed he was not taking any other blood pressure medications and only took nitroglycerin prn (had not taken in a long time).  Will recheck blood pressure in 2 weeks.  Advised if any dizziness or vision changes while on increased regimen with Cialis to let me know to stop Cialis immediately.   Thumb pain History and exam likely osteoarthritis.  Less likely septic joint or gout.  Advised Tylenol and occasional low-dose ibuprofen for pain relief given his recently elevated Cr and ASA use.  Can reassess at next visit and consider hand x-ray at that time.  Health maintenance No LDCT screening needed given quit >15 years ago.  He has already obtained his pneumonia shot.  He declines flu vaccine today.  He states he is in the process of getting his colonoscopy scheduled.  Janeal Holmes, MD [facility]

## 2023-01-23 ENCOUNTER — Encounter: Payer: Self-pay | Admitting: Family Medicine

## 2023-01-23 DIAGNOSIS — N529 Male erectile dysfunction, unspecified: Secondary | ICD-10-CM | POA: Insufficient documentation

## 2023-01-23 MED ORDER — TADALAFIL 20 MG PO TABS: 10.0000 mg | ORAL_TABLET | Freq: Every day | ORAL | 1 refills | Status: DC | PRN

## 2023-01-23 NOTE — Assessment & Plan Note (Addendum)
Uncontrolled.  Appears he has been prescribed an losartan and HCTZ for blood pressure, there is difficult for me to see records of this.  He has amlodipine on the list I can see. Called patient after appointment, and he confirmed he was not taking any other blood pressure medications and only took nitroglycerin prn (had not taken in a long time).  Will recheck blood pressure in 2 weeks.  Advised if any dizziness or vision changes while on increased regimen with Cialis to let me know to stop Cialis immediately.

## 2023-01-23 NOTE — Assessment & Plan Note (Addendum)
Likely due to vascular dysfunction and age.  Given patient's BP today, feel he can tolerate potential hypotensive effects of Cialis.  Will send in low-dose.  Advised not to use this concurrently with nitroglycerin.  Will see him back in 2 weeks to assess improvement.

## 2023-01-25 NOTE — Addendum Note (Signed)
Addended by: Caro Laroche on: 01/25/2023 10:05 AM   Modules accepted: Orders

## 2023-02-07 ENCOUNTER — Other Ambulatory Visit: Payer: Self-pay

## 2023-02-07 NOTE — Progress Notes (Signed)
[name] [birth year] [id number]  Patient attempted to be outreached by Thomasene Ripple, PharmD Candidate to discuss hypertension. Left voicemail for patient to return our call at their convenience at [phone].  Thomasene Ripple, Student-PharmD

## 2023-02-13 ENCOUNTER — Ambulatory Visit: Payer: 59 | Admitting: Family Medicine

## 2023-02-23 ENCOUNTER — Encounter: Payer: Self-pay | Admitting: Gastroenterology

## 2023-05-29 ENCOUNTER — Ambulatory Visit: Payer: 59 | Admitting: Gastroenterology

## 2023-09-17 DIAGNOSIS — H47292 Other optic atrophy, left eye: Secondary | ICD-10-CM | POA: Diagnosis not present

## 2023-09-17 DIAGNOSIS — H43811 Vitreous degeneration, right eye: Secondary | ICD-10-CM | POA: Diagnosis not present

## 2023-09-17 DIAGNOSIS — Z961 Presence of intraocular lens: Secondary | ICD-10-CM | POA: Diagnosis not present

## 2023-10-17 DIAGNOSIS — R3915 Urgency of urination: Secondary | ICD-10-CM | POA: Diagnosis not present

## 2023-10-18 ENCOUNTER — Encounter: Payer: Self-pay | Admitting: Family Medicine

## 2023-10-18 DIAGNOSIS — N401 Enlarged prostate with lower urinary tract symptoms: Secondary | ICD-10-CM | POA: Insufficient documentation

## 2023-12-26 DIAGNOSIS — R3915 Urgency of urination: Secondary | ICD-10-CM | POA: Diagnosis not present

## 2023-12-27 ENCOUNTER — Encounter: Admitting: Family Medicine

## 2023-12-31 ENCOUNTER — Telehealth: Payer: Self-pay | Admitting: Family Medicine

## 2023-12-31 ENCOUNTER — Ambulatory Visit (INDEPENDENT_AMBULATORY_CARE_PROVIDER_SITE_OTHER): Payer: 59

## 2023-12-31 VITALS — Ht 66.0 in | Wt 147.0 lb

## 2023-12-31 DIAGNOSIS — Z0000 Encounter for general adult medical examination without abnormal findings: Secondary | ICD-10-CM | POA: Diagnosis not present

## 2023-12-31 NOTE — Progress Notes (Addendum)
 Because this visit was a virtual/telehealth visit,  certain criteria was not obtained, such a blood pressure, CBG if applicable, and timed get up and go. Any medications not marked as taking were not mentioned during the medication reconciliation part of the visit. Any vitals not documented were not able to be obtained due to this being a telehealth visit or patient was unable to self-report a recent blood pressure reading due to a lack of equipment at home via telehealth. Vitals that have been documented are verbally provided by the patient.   Subjective:   [name] is a 73 y.o. who presents for a Medicare Wellness preventive visit.  As a reminder, Annual Wellness Visits don't include a physical exam, and some assessments may be limited, especially if this visit is performed virtually. We may recommend an in-person follow-up visit with your provider if needed.  Visit Complete: Virtual I connected with  [name] on 12/31/23 by a audio enabled telemedicine application and verified that I am speaking with the correct person using two identifiers.  Patient Location: Home  Provider Location: Home Office  I discussed the limitations of evaluation and management by telemedicine. The patient expressed understanding and agreed to proceed.  Vital Signs: Because this visit was a virtual/telehealth visit, some criteria may be missing or patient reported. Any vitals not documented were not able to be obtained and vitals that have been documented are patient reported.  VideoDeclined- This patient declined Librarian, academic. Therefore the visit was completed with audio only.  Persons Participating in Visit: Significant Other and patient was present during visit.  AWV Questionnaire: No: Patient Medicare AWV questionnaire was not completed prior to this visit.  Cardiac Risk Factors include: advanced age (>22men, >21 women);dyslipidemia;hypertension;male gender      Objective:    Today's Vitals   12/31/23 0852  Weight: 147 lb (66.7 kg)  Height: 5' 6 (1.676 m)  PainSc: 0-No pain   Body mass index is 23.73 kg/m.     12/31/2023    8:55 AM 01/22/2023    3:03 PM 12/29/2022    8:45 AM 02/15/2021   10:37 AM 01/05/2021    2:32 PM 11/18/2018    9:59 AM 02/13/2017    9:45 AM  Advanced Directives  Does Patient Have a Medical Advance Directive? No No No No No No No   Does patient want to make changes to medical advance directive?  No - Guardian declined       Would patient like information on creating a medical advance directive? No - Patient declined No - Patient declined Yes (MAU/Ambulatory/Procedural Areas - Information given) No - Patient declined  No - Patient declined  No - Patient declined      Data saved with a previous flowsheet row definition    Current Medications (verified) Outpatient Encounter Medications as of 12/31/2023  Medication Sig   amLODipine  (NORVASC ) 5 MG tablet Take 10 mg by mouth daily.   aspirin  EC 81 MG tablet Take 81 mg by mouth daily. Swallow whole.   atorvastatin  (LIPITOR ) 40 MG tablet Take 1 tablet (40 mg total) by mouth daily.   clopidogrel  (PLAVIX ) 75 MG tablet Take 1 tablet (75 mg total) by mouth daily.   nitroGLYCERIN  (NITROSTAT ) 0.4 MG SL tablet Place 0.4 mg under the tongue every 5 (five) minutes as needed.   tadalafil  (CIALIS ) 20 MG tablet Take 0.5 tablets (10 mg total) by mouth daily as needed for erectile dysfunction.   diclofenac  Sodium (  VOLTAREN ) 1 % GEL Apply 4 g topically 4 (four) times daily. (Patient not taking: Reported on 01/22/2023)   gabapentin  (NEURONTIN ) 100 MG capsule Take by mouth. (Patient not taking: Reported on 01/23/2023)   No facility-administered encounter medications on file as of 12/31/2023.    Allergies (verified) Patient has no known allergies.   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   MI (myocardial infarction) (HCC) 2011   Peptic ulcer disease    Pneumonia    when I was younger   Sinus bradycardia 09/28/2011   Stroke (HCC)    SVT (supraventricular tachycardia) (HCC)    TIA (transient ischemic attack)    TOBACCO USER 01/12/2009   Qualifier: Diagnosis of   By: Manford Longs         >>OVERVIEW FOR TOBACCO ABUSE, HX OF WRITTEN ON 06/22/2010  1:37 PM BY INTERFACE, PROBLEM LIST IN     Qualifier: Diagnosis of   By: Rolan Hough         Tubular adenoma of colon    Walking pneumonia ~ 2009   Past Surgical History:  Procedure Laterality Date   CARDIAC CATHETERIZATION     COLECTOMY  1990's   ulcer burst; took out part of my colon   CORONARY ANGIOPLASTY WITH STENT PLACEMENT  08/2006   1 cypher drug eluting stent ok for up to 3T    LEFT HEART CATHETERIZATION WITH CORONARY ANGIOGRAM N/A 09/29/2011   Procedure: LEFT HEART CATHETERIZATION WITH CORONARY ANGIOGRAM;  Surgeon: Rober LOISE Chroman, MD;  Location: [facility] CATH [facility];  Service: Cardiovascular;  Laterality: N/A;   Family History  Problem Relation Age of Onset   Colon cancer Father    Rectal cancer Neg Hx    Stomach cancer Neg Hx    Esophageal cancer Neg Hx    Social History   Socioeconomic History   Marital status: Single    Spouse name: Not on file   Number of children: 14   Years of education: Not on file   Highest education level: Not on file  Occupational History   Not on file  Tobacco Use   Smoking status: Former    Current packs/day: 0.00    Average packs/day: 0.5 packs/day for 52.0 years (26.0 ttl pk-yrs)    Types: Cigarettes    Start date: 02/26/1965    Quit date: 02/26/2017    Years since quitting: 6.8   Smokeless tobacco: Never  Vaping Use   Vaping status: Never Used  Substance and Sexual Activity   Alcohol  use: Yes    Alcohol/week: 12.0 standard drinks of alcohol    Types: 12 Cans of beer per week   Drug use: Yes    Types: Marijuana, Cocaine    Comment: ; cocaine ~ 2001, still smoke marijuana for pain and leisure   Sexual activity: Yes  Other Topics Concern   Not on file  Social History Narrative   Not on file   Social Drivers of Health   Financial Resource Strain: Low Risk  (12/31/2023)   Overall Financial Resource Strain (CARDIA)    Difficulty of Paying Living Expenses: Not hard at all  Food  Insecurity: No Food Insecurity (12/31/2023)   Hunger Vital Sign    Worried About Running Out of Food in the Last Year: Never true    Ran Out of Food in the Last Year: Never true  Transportation Needs: No Transportation Needs (12/31/2023)   PRAPARE - Administrator, Civil Service (Medical): No    Lack of Transportation (Non-Medical): No  Physical Activity: Sufficiently Active (12/31/2023)   Exercise Vital Sign    Days of Exercise per Week: 5 days    Minutes of Exercise per Session: 30 min  Stress: No Stress Concern Present (12/31/2023)   [employer] of Occupational Health - Occupational Stress Questionnaire    Feeling of Stress: Not at all  Social Connections: Moderately Integrated (12/31/2023)   Social Connection and Isolation Panel    Frequency of Communication with Friends and Family: More than three times a week    Frequency of Social Gatherings with Friends and Family: Three times a week    Attends Religious Services: 1 to 4 times per year    Active Member of Clubs or Organizations: No    Attends Banker Meetings: Never    Marital Status: Living with partner    Tobacco Counseling Counseling given: Not Answered    Clinical Intake:  Pre-visit preparation completed: Yes  Pain : No/denies pain Pain Score: 0-No pain     BMI - recorded: 23.73 Nutritional Status: BMI of 19-24  Normal Nutritional Risks: None Diabetes: No  Lab Results  Component  Value Date   HGBA1C 5.8 (H) 02/14/2017     How often do you need to have someone help you when you read instructions, pamphlets, or other written materials from your doctor or pharmacy?: 1 - Never  Interpreter Needed?: No  Information entered by :: Mry Lamia N. Derra Shartzer, LPN.   Activities of Daily Living     12/31/2023    8:56 AM  In your present state of health, do you have any difficulty performing the following activities:  Hearing? 0  Vision? 0  Difficulty concentrating or making decisions? 0  Walking or climbing stairs? 0  Dressing or bathing? 0  Doing errands, shopping? 0  Preparing Food and eating ? N  Using the Toilet? N  In the past six months, have you accidently leaked urine? N  Do you have problems with loss of bowel control? N  Managing your Medications? N  Managing your Finances? N  Housekeeping or managing your Housekeeping? N    Patient Care Team: Tharon Lung, MD as PCP - General (Family Medicine) [facility] as Consulting Physician (Ophthalmology) Octavia Charleston, MD as Referring Physician (Ophthalmology)  I have updated your Care Teams any recent Medical Services you may have received from other providers in the past year.     Assessment:   This is a routine wellness examination for [name].  Hearing/Vision screen Hearing Screening - Comments:: Denies hearing difficulties.  Vision Screening - Comments:: Wears rx glasses - up to date with routine eye exams with [facility]     Goals Addressed             This Visit's Progress    12/31/2023: To live day by day.         Depression Screen     12/31/2023    8:56 AM 01/22/2023    3:04 PM 12/29/2022    9:23 AM 10/16/2022    9:46 AM 02/15/2021   10:37 AM 02/24/2013   10:46  AM  PHQ 2/9 Scores  PHQ - 2 Score 0 0 0 0 0 0  PHQ- 9 Score 0 0  0 0     Fall Risk     12/31/2023    8:56 AM 01/22/2023    3:05 PM 12/29/2022    8:46 AM 10/16/2022    9:46 AM 02/15/2021   10:37 AM  Fall Risk    Falls in the past year? 0 0 0 0 0  Number falls in past yr: 0 0 0 0   Injury with Fall? 0 0 0 0   Risk for fall due to : No Fall Risks  No Fall Risks    Follow up Falls evaluation completed  Falls prevention discussed;Education provided;Falls evaluation completed      MEDICARE RISK AT HOME:  Medicare Risk at Home Any stairs in or around the home?: No If so, are there any without handrails?: No Home free of loose throw rugs in walkways, pet beds, electrical cords, etc?: Yes Adequate lighting in your home to reduce risk of falls?: Yes Life alert?: No Use of a cane, walker or w/c?: No Grab bars in the bathroom?: No Shower chair or bench in shower?: No Elevated toilet seat or a handicapped toilet?: No  TIMED UP AND GO:  Was the test performed?  No  Cognitive Function: Declined/Normal: No cognitive concerns noted by patient or family. Patient alert, oriented, able to answer questions appropriately and recall recent events. No signs of memory loss or confusion.    12/31/2023    8:57 AM  MMSE - Mini Mental State Exam  Not completed: Unable to complete        12/31/2023    8:57 AM 12/29/2022    9:22 AM  6CIT Screen  What Year? 0 points 0 points  What month? 0 points 0 points  What time? 0 points 0 points  Count back from 20 0 points 0 points  Months in reverse 0 points 2 points  Repeat phrase 0 points 2 points  Total Score 0 points 4 points    Immunizations Immunization History  Administered Date(s) Administered   PFIZER(Purple Top)SARS-COV-2 Vaccination 08/02/2019, 08/30/2019, 03/30/2020   Pneumococcal Polysaccharide-23 02/15/2017    Screening Tests Health Maintenance  Topic Date Due   Lung Cancer Screening  Never done   Zoster Vaccines- Shingrix (1 of 2) Never done   Pneumococcal Vaccine: 50+ Years (2 of 2 - PCV) 02/15/2018   Colonoscopy  11/17/2021   COVID-19 Vaccine (4 - 2024-25 season) 01/14/2023   LIPID PANEL  04/17/2023   INFLUENZA VACCINE  12/14/2023    Medicare Annual Wellness (AWV)  12/30/2024   Hepatitis C Screening  Completed   HPV VACCINES  Aged Out   Meningococcal B Vaccine  Aged Out   DTaP/Tdap/Td  Discontinued    Health Maintenance  Health Maintenance Due  Topic Date Due   Lung Cancer Screening  Never done   Zoster Vaccines- Shingrix (1 of 2) Never done   Pneumococcal Vaccine: 50+ Years (2 of 2 - PCV) 02/15/2018   Colonoscopy  11/17/2021   COVID-19 Vaccine (4 - 2024-25 season) 01/14/2023   LIPID PANEL  04/17/2023   INFLUENZA VACCINE  12/14/2023   Health Maintenance Items Addressed: Yes Patient aware of current care gaps.  Immunization record was verified by [facility] and updated in patient's chart.  Additional Screening:  Vision Screening: Recommended annual ophthalmology exams for early detection of glaucoma and other disorders of the eye. Would you like a  referral to an eye doctor? No    Dental Screening: Recommended annual dental exams for proper oral hygiene  Community Resource Referral / Chronic Care Management: CRR required this visit?  No   CCM required this visit?  No   Plan:    I have personally reviewed and noted the following in the patient's chart:   Medical and social history Use of alcohol, tobacco or illicit drugs  Current medications and supplements including opioid prescriptions. Patient is not currently taking opioid prescriptions. Functional ability and status Nutritional status Physical activity Advanced directives List of other physicians Hospitalizations, surgeries, and ER visits in previous 12 months Vitals Screenings to include cognitive, depression, and falls Referrals and appointments  In addition, I have reviewed and discussed with patient certain preventive protocols, quality metrics, and best practice recommendations. A written personalized care plan for preventive services as well as general preventive health recommendations were provided to patient.   Roz LOISE Fuller,  LPN   [id number]/81/7974   After Visit Summary: (Declined) Due to this being a telephonic visit, with patients personalized plan was offered to patient but patient Declined AVS at this time   Notes: Patient aware of current care gaps.  Immunization record was verified by [facility] and updated in patient's chart. Patient has been advised to take Pneumonia vaccine and Flu shot this Fall Season.

## 2023-12-31 NOTE — Patient Instructions (Signed)
 [name] , Thank you for taking time out of your busy schedule to complete your Annual Wellness Visit with me. I enjoyed our conversation and look forward to speaking with you again next year. I, as well as your care team,  appreciate your ongoing commitment to your health goals. Please review the following plan we discussed and let me know if I can assist you in the future. Your Game plan/ To Do List    Referrals: If you haven't heard from the office you've been referred to, please reach out to them at the phone provided.   Follow up Visits: We will see or speak with you next year for your Next Medicare AWV with our clinical staff Have you seen your provider in the last 6 months (3 months if uncontrolled diabetes)? No  Clinician Recommendations:  Aim for 30 minutes of exercise or brisk walking, 6-8 glasses of water, and 5 servings of fruits and vegetables each day.       This is a list of the screenings recommended for you:  Health Maintenance  Topic Date Due   Screening for Lung Cancer  Never done   Zoster (Shingles) Vaccine (1 of 2) Never done   Pneumococcal Vaccine for age over 26 (2 of 2 - PCV) 02/15/2018   Colon Cancer Screening  11/17/2021   COVID-19 Vaccine (4 - 2024-25 season) 01/14/2023   Lipid (cholesterol) test  04/17/2023   Flu Shot  12/14/2023   Medicare Annual Wellness Visit  12/30/2024   Hepatitis C Screening  Completed   HPV Vaccine  Aged Out   Meningitis B Vaccine  Aged Out   DTaP/Tdap/Td vaccine  Discontinued    Advanced directives: (Declined) Advance directive discussed with you today. Even though you declined this today, please call our office should you change your mind, and we can give you the proper paperwork for you to fill out. Advance Care Planning is important because it:  [x]  Makes sure you receive the medical care that is consistent with your values, goals, and preferences  [x]  It provides guidance to your family and loved ones and reduces their  decisional burden about whether or not they are making the right decisions based on your wishes.  Follow the link provided in your after visit summary or read over the paperwork we have mailed to you to help you started getting your Advance Directives in place. If you need assistance in completing these, please reach out to us  so that we can help you!  See attachments for Preventive Care and Fall Prevention Tips.

## 2023-12-31 NOTE — Telephone Encounter (Signed)
 Hi Risk manager,  Please schedule this patient for follow up with PCP  in the next 1  month--okay to be with another physician   Thanks, Suzann Daring, MD  [facility] Medicine Teaching Service

## 2024-02-12 ENCOUNTER — Ambulatory Visit: Admitting: Family Medicine

## 2024-02-12 ENCOUNTER — Encounter: Payer: Self-pay | Admitting: Family Medicine

## 2024-02-12 VITALS — BP 137/77 | HR 77 | Ht 66.0 in | Wt 143.0 lb

## 2024-02-12 DIAGNOSIS — I25119 Atherosclerotic heart disease of native coronary artery with unspecified angina pectoris: Secondary | ICD-10-CM

## 2024-02-12 DIAGNOSIS — R634 Abnormal weight loss: Secondary | ICD-10-CM

## 2024-02-12 DIAGNOSIS — I10 Essential (primary) hypertension: Secondary | ICD-10-CM | POA: Diagnosis not present

## 2024-02-12 DIAGNOSIS — N529 Male erectile dysfunction, unspecified: Secondary | ICD-10-CM | POA: Diagnosis not present

## 2024-02-12 DIAGNOSIS — N401 Enlarged prostate with lower urinary tract symptoms: Secondary | ICD-10-CM

## 2024-02-12 DIAGNOSIS — K219 Gastro-esophageal reflux disease without esophagitis: Secondary | ICD-10-CM | POA: Diagnosis not present

## 2024-02-12 DIAGNOSIS — Z0000 Encounter for general adult medical examination without abnormal findings: Secondary | ICD-10-CM | POA: Diagnosis not present

## 2024-02-12 DIAGNOSIS — N3281 Overactive bladder: Secondary | ICD-10-CM

## 2024-02-12 MED ORDER — TADALAFIL 20 MG PO TABS: 10.0000 mg | ORAL_TABLET | Freq: Every day | ORAL | 1 refills | Status: AC | PRN

## 2024-02-12 NOTE — Patient Instructions (Addendum)
 Continue your blood pressure medicines.  You could supplement food with ensure shakes to help with protein and save muscle.  I have refilled your cialis .  Call for your colonoscopy at: [address] [place], [place] [address]. Get Driving Directions. Main: [phone].

## 2024-02-12 NOTE — Progress Notes (Signed)
    SUBJECTIVE:   CHIEF COMPLAINT / HPI:   HTN Was prescribed amlodipine  10 mg daily. Unfortunately was not able to follow up after last appointment 1 year ago.  ED Taking Cialis  since last visit. He would like a refill.  Weight loss Feels he has lost too much weight. No fevers, chills, changes in bowel movements, changes in urination.  PERTINENT  PMH / PSH: CAD with ACS, GERD with gastric ulcer, OA, BPH, HLD  OBJECTIVE:   BP (!) 156/77   Pulse 77   Ht 5' 6 (1.676 m)   Wt 143 lb (64.9 kg)   SpO2 100%   BMI 23.08 kg/m   General: Alert and oriented, in NAD Skin: Warm, dry, and intact without lesions HEENT: NCAT, EOM grossly normal, midline nasal septum Cardiac: RRR, no m/r/g appreciated Respiratory: CTAB, breathing and speaking comfortably on RA Abdominal: Soft, nontender, nondistended, normoactive bowel sounds Extremities: Moves all extremities grossly equally Neurological: No gross focal deficit Psychiatric: Appropriate mood and affect   ASSESSMENT/PLAN:   Assessment & Plan HYPERTENSION, BENIGN ESSENTIAL  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   Stuart Redo, MD [facility]

## 2024-02-13 LAB — COMPREHENSIVE METABOLIC PANEL WITH GFR
ALT: 13 IU/L (ref 0–44)
AST: 23 IU/L (ref 0–40)
Albumin: 4.4 g/dL (ref 3.8–4.8)
Alkaline Phosphatase: 43 IU/L — ABNORMAL LOW (ref 47–123)
BUN/Creatinine Ratio: 13 (ref 10–24)
BUN: 15 mg/dL (ref 8–27)
Bilirubin Total: 0.3 mg/dL (ref 0.0–1.2)
CO2: 20 mmol/L (ref 20–29)
Calcium: 9.4 mg/dL (ref 8.6–10.2)
Chloride: 103 mmol/L (ref 96–106)
Creatinine, Ser: 1.17 mg/dL (ref 0.76–1.27)
Globulin, Total: 2.6 g/dL (ref 1.5–4.5)
Glucose: 90 mg/dL (ref 70–99)
Potassium: 4.5 mmol/L (ref 3.5–5.2)
Sodium: 139 mmol/L (ref 134–144)
Total Protein: 7 g/dL (ref 6.0–8.5)
eGFR: 67 mL/min/1.73 (ref >59)

## 2024-02-13 LAB — CBC WITH DIFFERENTIAL/PLATELET
Basophils Absolute: 0.1 x10E3/uL (ref 0.0–0.2)
Basos: 1 %
EOS (ABSOLUTE): 0.6 x10E3/uL — ABNORMAL HIGH (ref 0.0–0.4)
Eos: 9 %
Hematocrit: 40.9 % (ref 37.5–51.0)
Hemoglobin: 12.7 g/dL — ABNORMAL LOW (ref 13.0–17.7)
Immature Grans (Abs): 0 x10E3/uL (ref 0.0–0.1)
Immature Granulocytes: 0 %
Lymphocytes Absolute: 2.7 x10E3/uL (ref 0.7–3.1)
Lymphs: 37 %
MCH: 23.8 pg — ABNORMAL LOW (ref 26.6–33.0)
MCHC: 31.1 g/dL — ABNORMAL LOW (ref 31.5–35.7)
MCV: 77 fL — ABNORMAL LOW (ref 79–97)
Monocytes Absolute: 0.8 x10E3/uL (ref 0.1–0.9)
Monocytes: 11 %
Neutrophils Absolute: 3.1 x10E3/uL (ref 1.4–7.0)
Neutrophils: 42 %
Platelets: 343 x10E3/uL (ref 150–450)
RBC: 5.34 x10E6/uL (ref 4.14–5.80)
RDW: 15.5 % — ABNORMAL HIGH (ref 11.6–15.4)
WBC: 7.3 x10E3/uL (ref 3.4–10.8)

## 2024-02-13 LAB — LIPID PANEL
Chol/HDL Ratio: 2.3 ratio (ref 0.0–5.0)
Cholesterol, Total: 180 mg/dL (ref 100–199)
HDL: 78 mg/dL (ref >39)
LDL Chol Calc (NIH): 70 mg/dL (ref 0–99)
Triglycerides: 197 mg/dL — ABNORMAL HIGH (ref 0–149)
VLDL Cholesterol Cal: 32 mg/dL (ref 5–40)

## 2024-02-14 ENCOUNTER — Ambulatory Visit: Payer: Self-pay | Admitting: Family Medicine

## 2024-02-14 DIAGNOSIS — Z0000 Encounter for general adult medical examination without abnormal findings: Secondary | ICD-10-CM | POA: Insufficient documentation

## 2024-02-14 DIAGNOSIS — N3281 Overactive bladder: Secondary | ICD-10-CM | POA: Insufficient documentation

## 2024-02-14 NOTE — Assessment & Plan Note (Signed)
 He is also taking Myrbetriq 50 mg daily per urology.  Continue this.

## 2024-02-14 NOTE — Assessment & Plan Note (Signed)
 Refill Cialis  given good response and tolerance in the past.

## 2024-02-14 NOTE — Assessment & Plan Note (Addendum)
 Controlled for age.  Continue Hyzaar and amlodipine .  Repeat BMP today.

## 2024-02-14 NOTE — Assessment & Plan Note (Addendum)
 Patient has received multiple vaccines from [facility] [address].  Will message health maintenance pool to get these records.  He will call with get his colonoscopy at  [facility].

## 2024-02-14 NOTE — Progress Notes (Signed)
 Called patient to discuss test results.  Reiterated hypertriglyceridemia likely secondary to eating breakfast before labs were taken.  LDL normal.  Kidney function and liver function normal.  Appear to see does have a microcytic anemia has been present for the last 7 years.  Last colonoscopy in 2020 with tubular adenomas.  We discussed the importance of proceeding with a repeat colonoscopy given his age and this microcytic anemia as discussed at our last office visit; he will call either today or tomorrow to make an appointment for this.

## 2024-02-14 NOTE — Assessment & Plan Note (Addendum)
 Continue aspirin  and Plavix .  Appears stable.  Continue pravastatin 40 mg daily.  Repeat lipid panel today.

## 2025-01-01 ENCOUNTER — Encounter
